# Patient Record
Sex: Female | Born: 1970 | Hispanic: Yes | Smoking: Never smoker
Health system: Southern US, Community
[De-identification: ages and names within clinical notes are randomized; demographics above are authoritative.]

## PROBLEM LIST (undated history)

## (undated) DIAGNOSIS — L509 Urticaria, unspecified: Secondary | ICD-10-CM

## (undated) DIAGNOSIS — R51 Headache: Secondary | ICD-10-CM

## (undated) DIAGNOSIS — R519 Headache, unspecified: Secondary | ICD-10-CM

## (undated) DIAGNOSIS — I10 Essential (primary) hypertension: Secondary | ICD-10-CM

## (undated) DIAGNOSIS — E039 Hypothyroidism, unspecified: Secondary | ICD-10-CM

## (undated) DIAGNOSIS — R200 Anesthesia of skin: Secondary | ICD-10-CM

## (undated) DIAGNOSIS — M5430 Sciatica, unspecified side: Secondary | ICD-10-CM

## (undated) DIAGNOSIS — F32A Depression, unspecified: Secondary | ICD-10-CM

## (undated) DIAGNOSIS — K760 Fatty (change of) liver, not elsewhere classified: Secondary | ICD-10-CM

## (undated) DIAGNOSIS — J45909 Unspecified asthma, uncomplicated: Secondary | ICD-10-CM

## (undated) DIAGNOSIS — M1990 Unspecified osteoarthritis, unspecified site: Secondary | ICD-10-CM

## (undated) DIAGNOSIS — Z8639 Personal history of other endocrine, nutritional and metabolic disease: Secondary | ICD-10-CM

## (undated) DIAGNOSIS — E079 Disorder of thyroid, unspecified: Secondary | ICD-10-CM

## (undated) DIAGNOSIS — K8020 Calculus of gallbladder without cholecystitis without obstruction: Secondary | ICD-10-CM

## (undated) DIAGNOSIS — F329 Major depressive disorder, single episode, unspecified: Secondary | ICD-10-CM

## (undated) DIAGNOSIS — K219 Gastro-esophageal reflux disease without esophagitis: Secondary | ICD-10-CM

## (undated) HISTORY — PX: APPENDECTOMY: SHX54

## (undated) HISTORY — DX: Gastro-esophageal reflux disease without esophagitis: K21.9

## (undated) HISTORY — PX: COLONOSCOPY: SHX174

## (undated) HISTORY — PX: ABDOMINAL HYSTERECTOMY: SHX81

## (undated) HISTORY — PX: UPPER GI ENDOSCOPY: SHX6162

## (undated) HISTORY — DX: Urticaria, unspecified: L50.9

---

## 2008-02-13 ENCOUNTER — Ambulatory Visit: Payer: Self-pay | Admitting: *Deleted

## 2008-02-13 ENCOUNTER — Encounter (INDEPENDENT_AMBULATORY_CARE_PROVIDER_SITE_OTHER): Payer: Self-pay | Admitting: Family Medicine

## 2008-02-13 ENCOUNTER — Ambulatory Visit: Payer: Self-pay | Admitting: Internal Medicine

## 2008-02-13 LAB — CONVERTED CEMR LAB
ALT: 29 units/L (ref 0–35)
AST: 21 units/L (ref 0–37)
Alkaline Phosphatase: 87 units/L (ref 39–117)
Basophils Absolute: 0 10*3/uL (ref 0.0–0.1)
Basophils Relative: 0 % (ref 0–1)
Creatinine, Ser: 0.6 mg/dL (ref 0.40–1.20)
Eosinophils Relative: 2 % (ref 0–5)
HCT: 39.4 % (ref 36.0–46.0)
Hemoglobin: 12.7 g/dL (ref 12.0–15.0)
MCHC: 32.2 g/dL (ref 30.0–36.0)
Monocytes Absolute: 0.7 10*3/uL (ref 0.1–1.0)
Platelets: 313 10*3/uL (ref 150–400)
RDW: 13.9 % (ref 11.5–15.5)
Sodium: 139 meq/L (ref 135–145)
TSH: 8.121 microintl units/mL — ABNORMAL HIGH (ref 0.350–5.50)
Total Bilirubin: 0.3 mg/dL (ref 0.3–1.2)
Total Protein: 7 g/dL (ref 6.0–8.3)

## 2008-03-17 ENCOUNTER — Ambulatory Visit: Payer: Self-pay | Admitting: Family Medicine

## 2008-03-17 ENCOUNTER — Encounter (INDEPENDENT_AMBULATORY_CARE_PROVIDER_SITE_OTHER): Payer: Self-pay | Admitting: Family Medicine

## 2008-04-22 ENCOUNTER — Encounter (INDEPENDENT_AMBULATORY_CARE_PROVIDER_SITE_OTHER): Payer: Self-pay | Admitting: Family Medicine

## 2008-04-22 ENCOUNTER — Ambulatory Visit: Payer: Self-pay | Admitting: Internal Medicine

## 2008-09-01 ENCOUNTER — Ambulatory Visit: Payer: Self-pay | Admitting: Internal Medicine

## 2008-10-31 ENCOUNTER — Ambulatory Visit: Payer: Self-pay | Admitting: Internal Medicine

## 2008-10-31 ENCOUNTER — Encounter: Payer: Self-pay | Admitting: Family Medicine

## 2008-10-31 LAB — CONVERTED CEMR LAB
AST: 16 units/L (ref 0–37)
Albumin: 4.1 g/dL (ref 3.5–5.2)
Alkaline Phosphatase: 68 units/L (ref 39–117)
Band Neutrophils: 0 % (ref 0–10)
Chloride: 106 meq/L (ref 96–112)
Eosinophils Relative: 1 % (ref 0–5)
Glucose, Bld: 108 mg/dL — ABNORMAL HIGH (ref 70–99)
HCT: 35.4 % — ABNORMAL LOW (ref 36.0–46.0)
Hemoglobin: 12.5 g/dL (ref 12.0–15.0)
Lymphocytes Relative: 36 % (ref 12–46)
Lymphs Abs: 2.9 10*3/uL (ref 0.7–4.0)
MCV: 85.9 fL (ref 78.0–100.0)
Monocytes Relative: 10 % (ref 3–12)
Potassium: 3.8 meq/L (ref 3.5–5.3)
RBC: 4.12 M/uL (ref 3.87–5.11)
Sodium: 141 meq/L (ref 135–145)
Total Protein: 7.3 g/dL (ref 6.0–8.3)
WBC: 8.1 10*3/uL (ref 4.0–10.5)

## 2008-11-28 ENCOUNTER — Encounter: Payer: Self-pay | Admitting: Family Medicine

## 2008-11-28 ENCOUNTER — Ambulatory Visit: Payer: Self-pay | Admitting: Internal Medicine

## 2008-11-28 LAB — CONVERTED CEMR LAB: Vit D, 25-Hydroxy: 22 ng/mL — ABNORMAL LOW (ref 30–89)

## 2008-12-02 ENCOUNTER — Encounter: Payer: Self-pay | Admitting: Family Medicine

## 2008-12-22 ENCOUNTER — Ambulatory Visit: Payer: Self-pay | Admitting: Internal Medicine

## 2009-03-27 ENCOUNTER — Ambulatory Visit: Payer: Self-pay | Admitting: Internal Medicine

## 2009-03-27 ENCOUNTER — Encounter: Payer: Self-pay | Admitting: Family Medicine

## 2009-03-27 LAB — CONVERTED CEMR LAB
BUN: 14 mg/dL (ref 6–23)
CO2: 19 meq/L (ref 19–32)
Calcium: 9.3 mg/dL (ref 8.4–10.5)
Chloride: 106 meq/L (ref 96–112)
Creatinine, Ser: 0.87 mg/dL (ref 0.40–1.20)
TSH: 1.695 microintl units/mL (ref 0.350–4.500)

## 2009-05-01 ENCOUNTER — Ambulatory Visit: Payer: Self-pay | Admitting: Internal Medicine

## 2009-07-06 ENCOUNTER — Ambulatory Visit: Payer: Self-pay | Admitting: Internal Medicine

## 2009-07-13 ENCOUNTER — Encounter: Payer: Self-pay | Admitting: Family Medicine

## 2009-07-13 ENCOUNTER — Ambulatory Visit: Payer: Self-pay | Admitting: Internal Medicine

## 2009-07-13 LAB — CONVERTED CEMR LAB
ALT: 16 units/L (ref 0–35)
AST: 20 units/L (ref 0–37)
Albumin: 4.3 g/dL (ref 3.5–5.2)
Calcium: 9 mg/dL (ref 8.4–10.5)
Chloride: 102 meq/L (ref 96–112)
Potassium: 3.8 meq/L (ref 3.5–5.3)
Total Protein: 7.2 g/dL (ref 6.0–8.3)

## 2009-07-15 ENCOUNTER — Ambulatory Visit: Payer: Self-pay | Admitting: Internal Medicine

## 2009-08-12 ENCOUNTER — Ambulatory Visit: Payer: Self-pay | Admitting: Internal Medicine

## 2010-06-04 ENCOUNTER — Encounter (INDEPENDENT_AMBULATORY_CARE_PROVIDER_SITE_OTHER): Payer: Self-pay | Admitting: *Deleted

## 2010-06-04 LAB — CONVERTED CEMR LAB
ALT: 15 units/L (ref 0–35)
AST: 17 units/L (ref 0–37)
Basophils Absolute: 0 10*3/uL (ref 0.0–0.1)
Basophils Relative: 0 % (ref 0–1)
Calcium: 8.9 mg/dL (ref 8.4–10.5)
Chloride: 103 meq/L (ref 96–112)
Creatinine, Ser: 0.66 mg/dL (ref 0.40–1.20)
Hemoglobin: 12.5 g/dL (ref 12.0–15.0)
MCHC: 33.4 g/dL (ref 30.0–36.0)
Monocytes Absolute: 0.6 10*3/uL (ref 0.1–1.0)
Neutro Abs: 3.5 10*3/uL (ref 1.7–7.7)
Neutrophils Relative %: 51 % (ref 43–77)
RDW: 13.5 % (ref 11.5–15.5)

## 2010-06-28 ENCOUNTER — Encounter (INDEPENDENT_AMBULATORY_CARE_PROVIDER_SITE_OTHER): Payer: Self-pay | Admitting: *Deleted

## 2010-06-28 LAB — CONVERTED CEMR LAB: GC Probe Amp, Genital: NEGATIVE

## 2011-03-17 ENCOUNTER — Other Ambulatory Visit: Payer: Self-pay

## 2011-03-17 ENCOUNTER — Ambulatory Visit (HOSPITAL_COMMUNITY)
Admission: RE | Admit: 2011-03-17 | Discharge: 2011-03-17 | Disposition: A | Discharge status: Home / Self Care | Payer: Self-pay | Source: Ambulatory Visit

## 2011-03-17 DIAGNOSIS — M542 Cervicalgia: Secondary | ICD-10-CM

## 2011-03-17 DIAGNOSIS — M549 Dorsalgia, unspecified: Secondary | ICD-10-CM | POA: Insufficient documentation

## 2011-03-17 DIAGNOSIS — M25519 Pain in unspecified shoulder: Secondary | ICD-10-CM | POA: Insufficient documentation

## 2011-03-17 DIAGNOSIS — M519 Unspecified thoracic, thoracolumbar and lumbosacral intervertebral disc disorder: Secondary | ICD-10-CM | POA: Insufficient documentation

## 2011-09-27 ENCOUNTER — Other Ambulatory Visit: Payer: Self-pay | Admitting: Family Medicine

## 2011-10-21 ENCOUNTER — Ambulatory Visit: Payer: Self-pay | Attending: Family Medicine | Admitting: Physical Therapy

## 2011-10-21 DIAGNOSIS — IMO0001 Reserved for inherently not codable concepts without codable children: Secondary | ICD-10-CM | POA: Insufficient documentation

## 2011-10-21 DIAGNOSIS — M542 Cervicalgia: Secondary | ICD-10-CM | POA: Insufficient documentation

## 2011-10-21 DIAGNOSIS — M25519 Pain in unspecified shoulder: Secondary | ICD-10-CM | POA: Insufficient documentation

## 2011-10-26 ENCOUNTER — Ambulatory Visit: Payer: Self-pay | Admitting: Rehabilitation

## 2011-10-28 ENCOUNTER — Ambulatory Visit: Payer: Self-pay | Admitting: Physical Therapy

## 2011-11-01 ENCOUNTER — Ambulatory Visit: Payer: Self-pay | Admitting: Rehabilitation

## 2011-11-03 ENCOUNTER — Ambulatory Visit: Payer: Self-pay | Admitting: Rehabilitation

## 2011-11-08 ENCOUNTER — Ambulatory Visit: Payer: Self-pay | Admitting: Physical Therapy

## 2011-11-10 ENCOUNTER — Ambulatory Visit: Payer: Self-pay | Admitting: Rehabilitation

## 2011-11-15 ENCOUNTER — Ambulatory Visit: Payer: Self-pay | Admitting: Physical Therapy

## 2011-11-17 ENCOUNTER — Encounter: Payer: Self-pay | Admitting: Physical Therapy

## 2012-12-23 ENCOUNTER — Encounter (HOSPITAL_COMMUNITY): Payer: Self-pay | Admitting: Family Medicine

## 2012-12-23 ENCOUNTER — Emergency Department (HOSPITAL_COMMUNITY): Payer: Self-pay

## 2012-12-23 ENCOUNTER — Emergency Department (HOSPITAL_COMMUNITY)
Admission: EM | Admit: 2012-12-23 | Discharge: 2012-12-23 | Disposition: A | Discharge status: Home / Self Care | Payer: Self-pay | Attending: Emergency Medicine | Admitting: Emergency Medicine

## 2012-12-23 DIAGNOSIS — R51 Headache: Secondary | ICD-10-CM | POA: Insufficient documentation

## 2012-12-23 DIAGNOSIS — R0602 Shortness of breath: Secondary | ICD-10-CM | POA: Insufficient documentation

## 2012-12-23 DIAGNOSIS — Z862 Personal history of diseases of the blood and blood-forming organs and certain disorders involving the immune mechanism: Secondary | ICD-10-CM | POA: Insufficient documentation

## 2012-12-23 DIAGNOSIS — R079 Chest pain, unspecified: Secondary | ICD-10-CM | POA: Insufficient documentation

## 2012-12-23 DIAGNOSIS — R110 Nausea: Secondary | ICD-10-CM | POA: Insufficient documentation

## 2012-12-23 DIAGNOSIS — Z8639 Personal history of other endocrine, nutritional and metabolic disease: Secondary | ICD-10-CM | POA: Insufficient documentation

## 2012-12-23 HISTORY — DX: Disorder of thyroid, unspecified: E07.9

## 2012-12-23 LAB — POCT I-STAT, CHEM 8
BUN: 20 mg/dL (ref 6–23)
Calcium, Ion: 1.26 mmol/L — ABNORMAL HIGH (ref 1.12–1.23)
Creatinine, Ser: 0.8 mg/dL (ref 0.50–1.10)
Glucose, Bld: 87 mg/dL (ref 70–99)
TCO2: 25 mmol/L (ref 0–100)

## 2012-12-23 LAB — URINALYSIS, ROUTINE W REFLEX MICROSCOPIC
Leukocytes, UA: NEGATIVE
Protein, ur: NEGATIVE mg/dL
Urobilinogen, UA: 0.2 mg/dL (ref 0.0–1.0)

## 2012-12-23 LAB — CBC WITH DIFFERENTIAL/PLATELET
Basophils Relative: 1 % (ref 0–1)
Eosinophils Absolute: 0.2 10*3/uL (ref 0.0–0.7)
Lymphs Abs: 4.8 10*3/uL — ABNORMAL HIGH (ref 0.7–4.0)
MCH: 30.3 pg (ref 26.0–34.0)
MCHC: 36.3 g/dL — ABNORMAL HIGH (ref 30.0–36.0)
Neutro Abs: 3.5 10*3/uL (ref 1.7–7.7)
Neutrophils Relative %: 37 % — ABNORMAL LOW (ref 43–77)
Platelets: 280 10*3/uL (ref 150–400)
RBC: 4.55 MIL/uL (ref 3.87–5.11)

## 2012-12-23 LAB — PREGNANCY, URINE: Preg Test, Ur: NEGATIVE

## 2012-12-23 MED ORDER — SUCRALFATE 1 GM/10ML PO SUSP: 1.0000 g | Freq: Four times a day (QID) | ORAL | Status: DC

## 2012-12-23 MED ORDER — OMEPRAZOLE 20 MG PO CPDR: 20.0000 mg | DELAYED_RELEASE_CAPSULE | Freq: Every day | ORAL | Status: DC

## 2012-12-23 MED ORDER — GI COCKTAIL ~~LOC~~
30.0000 mL | Freq: Once | ORAL | Status: AC
  Administered 2012-12-23: 30 mL via ORAL
  Filled 2012-12-23: qty 30

## 2012-12-23 NOTE — ED Notes (Signed)
NAD noted at time of d/c home 

## 2012-12-23 NOTE — ED Provider Notes (Signed)
History     CSN: [id number]  Arrival date & time 12/23/12  1651   First MD Initiated Contact with Patient 12/23/12 1659      Chief Complaint  Patient presents with  . Shortness of Breath  . Chest Pain    (Consider location/radiation/quality/duration/timing/severity/associated sxs/prior treatment) HPI Comments: Patient presents with a two month history of chest pain or shortness of breath. She describes a tightness in the Center of her chest and she points up and down to her chest and throat area. She says it feels tight. It happens daily. It's nonexertional. She has associated shortness of breath with it. She has no nausea other than when she has a headache. She has chronic daily headaches which are unchanged from her baseline. She denies any diaphoresis. She previously had a productive cough but currently denies that. Denies a fevers or chills. Denies any pleuritic-type chest pain. Denies any leg pain or swelling. Denies any family history of early heart disease. She's a nonsmoker and has no other past medical history other than thyroid disease. She's been told her blood pressures been mildly elevated in the past. She was seen by her primary care provider and started on albuterol inhaler which has not improved her symptoms. She does feel wheezy at times.  Patient is a 42 y.o. female presenting with shortness of breath and chest pain.  Shortness of Breath Associated symptoms: chest pain and headaches   Associated symptoms: no abdominal pain, no cough, no diaphoresis, no fever, no rash and no vomiting   Chest Pain Associated symptoms: headache, nausea and shortness of breath   Associated symptoms: no abdominal pain, no back pain, no cough, no diaphoresis, no dizziness, no fatigue, no fever, no numbness, not vomiting and no weakness     Past Medical History  Diagnosis Date  . Thyroid disease     History reviewed. No pertinent past surgical history.  History reviewed. No pertinent  family history.  History  Substance Use Topics  . Smoking status: Never Smoker   . Smokeless tobacco: Not on file  . Alcohol Use: No    OB History   Grav Para Term Preterm Abortions TAB SAB Ect Mult Living                  Review of Systems  Constitutional: Negative for fever, chills, diaphoresis and fatigue.  HENT: Negative for congestion, rhinorrhea and sneezing.   Eyes: Negative.   Respiratory: Positive for shortness of breath. Negative for cough and chest tightness.   Cardiovascular: Positive for chest pain. Negative for leg swelling.  Gastrointestinal: Positive for nausea. Negative for vomiting, abdominal pain, diarrhea and blood in stool.  Genitourinary: Negative for frequency, hematuria, flank pain and difficulty urinating.  Musculoskeletal: Negative for back pain and arthralgias.  Skin: Negative for rash.  Neurological: Positive for headaches. Negative for dizziness, speech difficulty, weakness and numbness.    Allergies  Review of patient's allergies indicates no known allergies.  Home Medications   Current Outpatient Rx  Name  Route  Sig  Dispense  Refill  . omeprazole (PRILOSEC) 20 MG capsule   Oral   Take 1 capsule (20 mg total) by mouth daily.   30 capsule   0     BP 140/87  Temp(Src) 99.2 F (37.3 C) (Oral)  Resp 11  SpO2 100%  LMP 12/03/2012  Physical Exam  Constitutional: She is oriented to person, place, and time. She appears well-developed and well-nourished.  HENT:  Head: Normocephalic and atraumatic.  Eyes: Pupils are equal, round, and reactive to light.  Neck: Normal range of motion. Neck supple.  Cardiovascular: Normal rate, regular rhythm and normal heart sounds.   Pulmonary/Chest: Effort normal and breath sounds normal. No respiratory distress. She has no wheezes. She has no rales. She exhibits no tenderness.  Abdominal: Soft. Bowel sounds are normal. There is no tenderness. There is no rebound and no guarding.  Musculoskeletal:  Normal range of motion. She exhibits no edema.  Negative Homans sign  Lymphadenopathy:    She has no cervical adenopathy.  Neurological: She is alert and oriented to person, place, and time.  Skin: Skin is warm and dry. No rash noted.  Psychiatric: She has a normal mood and affect.    ED Course  Procedures (including critical care time)   Date: 12/23/2012  Rate: 69  Rhythm: normal sinus rhythm  QRS Axis: normal  Intervals: normal  ST/T Wave abnormalities: nonspecific ST/T changes  Conduction Disutrbances:none  Narrative Interpretation:   Old EKG Reviewed: none available  Results for orders placed during the hospital encounter of 12/23/12  CBC WITH DIFFERENTIAL      Result Value Range   WBC 9.7  4.0 - 10.5 K/uL   RBC 4.55  3.87 - 5.11 MIL/uL   Hemoglobin 13.8  12.0 - 15.0 g/dL   HCT 78.2  95.6 - 21.3 %   MCV 83.5  78.0 - 100.0 fL   MCH 30.3  26.0 - 34.0 pg   MCHC 36.3 (*) 30.0 - 36.0 g/dL   RDW 08.6  57.8 - 46.9 %   Platelets 280  150 - 400 K/uL   Neutrophils Relative 37 (*) 43 - 77 %   Neutro Abs 3.5  1.7 - 7.7 K/uL   Lymphocytes Relative 50 (*) 12 - 46 %   Lymphs Abs 4.8 (*) 0.7 - 4.0 K/uL   Monocytes Relative 10  3 - 12 %   Monocytes Absolute 1.0  0.1 - 1.0 K/uL   Eosinophils Relative 2  0 - 5 %   Eosinophils Absolute 0.2  0.0 - 0.7 K/uL   Basophils Relative 1  0 - 1 %   Basophils Absolute 0.1  0.0 - 0.1 K/uL  URINALYSIS, ROUTINE W REFLEX MICROSCOPIC      Result Value Range   Color, Urine YELLOW  YELLOW   APPearance CLOUDY (*) CLEAR   Specific Gravity, Urine 1.019  1.005 - 1.030   pH 6.5  5.0 - 8.0   Glucose, UA NEGATIVE  NEGATIVE mg/dL   Hgb urine dipstick NEGATIVE  NEGATIVE   Bilirubin Urine NEGATIVE  NEGATIVE   Ketones, ur NEGATIVE  NEGATIVE mg/dL   Protein, ur NEGATIVE  NEGATIVE mg/dL   Urobilinogen, UA 0.2  0.0 - 1.0 mg/dL   Nitrite NEGATIVE  NEGATIVE   Leukocytes, UA NEGATIVE  NEGATIVE  PREGNANCY, URINE      Result Value Range   Preg Test, Ur  NEGATIVE  NEGATIVE  D-DIMER, QUANTITATIVE      Result Value Range   D-Dimer, Quant <0.27  0.00 - 0.48 ug/mL-FEU  POCT I-STAT TROPONIN I      Result Value Range   Troponin i, poc 0.00  0.00 - 0.08 ng/mL   Comment 3           POCT I-STAT, CHEM 8      Result Value Range   Sodium 140  135 - 145 mEq/L   Potassium 3.1 (*) 3.5 - 5.1 mEq/L   Chloride 105  96 - 112 mEq/L   BUN 20  6 - 23 mg/dL   Creatinine, Ser 1.61  0.50 - 1.10 mg/dL   Glucose, Bld 87  70 - 99 mg/dL   Calcium, Ion 0.96 (*) 1.12 - 1.23 mmol/L   TCO2 25  0 - 100 mmol/L   Hemoglobin 13.9  12.0 - 15.0 g/dL   HCT 04.5  40.9 - 81.1 %   Dg Chest 2 View  12/23/2012  *RADIOLOGY REPORT*  Clinical Data: Chest pain.  Nonsmoker.  CHEST - 2 VIEW  Comparison: None.  Findings: Suboptimal inspiration.  Heart size and mediastinal contours are normal.  The lungs are clear aside from possible mild central airway thickening.  There is no pleural effusion or pneumothorax.  Telemetry leads overlie the chest.  IMPRESSION: Possible mild central airway thickening.  No airspace disease or pleural effusion demonstrated.   Original Report Authenticated By: Carey Bullocks, M.D.        1. Chest pain       MDM  Patient is a 2 month history of daily discomfort in the Center of her chest. She points up into her throat area. She has associated shortness of breath currently exam is clear. She's been using albuterol inhaler without relief. Her chest x-ray is clear. She has no symptoms suggestive of pulmonary embolus. I do not feel her symptoms are consistent with acute coronary syndrome. She has no other associated symptoms. I will go ahead and treat her for possible reflux and I stressed the importance of close followup with her primary care physician. Advise return here as needed for any worsening symptoms.        Rolan Bucco, MD 12/23/12 [id number]

## 2012-12-23 NOTE — ED Notes (Addendum)
Per pt translator she has been having SOB, and tightness in chest. sts was given inhaler by her doctor but it isn't working. sts has been intermittent x 2 months. sts also she has been having some dizziness and HA. Pt tearful

## 2016-06-16 ENCOUNTER — Encounter: Payer: Self-pay | Admitting: Gynecology

## 2016-06-16 ENCOUNTER — Ambulatory Visit (INDEPENDENT_AMBULATORY_CARE_PROVIDER_SITE_OTHER): Payer: 59 | Admitting: Gynecology

## 2016-06-16 VITALS — BP 142/88 | Ht 62.0 in | Wt 161.0 lb

## 2016-06-16 DIAGNOSIS — N940 Mittelschmerz: Secondary | ICD-10-CM

## 2016-06-16 DIAGNOSIS — N898 Other specified noninflammatory disorders of vagina: Secondary | ICD-10-CM

## 2016-06-16 DIAGNOSIS — N941 Unspecified dyspareunia: Secondary | ICD-10-CM | POA: Diagnosis not present

## 2016-06-16 LAB — WET PREP FOR TRICH, YEAST, CLUE
CLUE CELLS WET PREP: NONE SEEN
Trich, Wet Prep: NONE SEEN
WBC WET PREP: NONE SEEN
Yeast Wet Prep HPF POC: NONE SEEN

## 2016-06-16 NOTE — Progress Notes (Signed)
   HPI: Patient is a 45 year old gravida 4 para 4 that was referred to our practice by her PCP as a result of patient's dyspareunia and occasional low abdominal discomfort in between menstrual cycles and also wanted to discuss the possibility of tubal reversal since she remarried. Her 4 previous children was by another partner and subsequently she had a tubal ligation. She reports normal menstrual cycles otherwise. She states for about 4 years she's been having this clear discharge midcycle. She is in monogamous relationship. She denies any fever, chills, nausea, vomiting or any dysuria frequency.    ROS: A ROS was performed and pertinent positives and negatives are included in the history.  GENERAL: No fevers or chills. HEENT: No change in vision, no earache, sore throat or sinus congestion. NECK: No pain or stiffness. CARDIOVASCULAR: No chest pain or pressure. No palpitations. PULMONARY: No shortness of breath, cough or wheeze. GASTROINTESTINAL: No abdominal pain, nausea, vomiting or diarrhea, melena or bright red blood per rectum. GENITOURINARY: No urinary frequency, urgency, hesitancy or dysuria. MUSCULOSKELETAL: No joint or muscle pain, no back pain, no recent trauma. DERMATOLOGIC: No rash, no itching, no lesions. ENDOCRINE: No polyuria, polydipsia, no heat or cold intolerance. No recent change in weight. HEMATOLOGICAL: No anemia or easy bruising or bleeding. NEUROLOGIC: No headache, seizures, numbness, tingling or weakness. PSYCHIATRIC: No depression, no loss of interest in normal activity or change in sleep pattern.   PE: Blood pressure 142/88 Gen. appearance well-developed well-nourished overweight Hispanic female with a weight 161 pounds BMI 29.45 kg/m Back: No CVA tenderness Abdomen: Soft nontender no rebound or guarding Pelvic: Bartholin urethra Skene was within normal limits Vagina: No lesions or discharge, menstrual blood present today's date 10 of her cycle Cervix: No gross lesions  noted Bimanual exam: Uterus anteverted normal size shape and consistency Adnexa: No palpable masses or tenderness Rectal exam not done  Wet prep essentially negative  GC and Chlamydia culture obtained   Assessment Plan: Problem #1: For her dyspareunia since she sometimes suffers and vaginal dryness can use over-the-counter [place] problem #2 light-colored discharge she notices midcycle is attributed to her ovulation this is normal physiological discharge. A GC and Chlamydia culture was obtained results pending at time of this dictation. #3 patient requesting tubal reversal we discussed the risks benefits and pros and cons of tubal reversal versus in vitro fertilization such as to cost and success rate. I refer her to reproductive endocrinologist Dr. Eliott Nine for discussion. On in vitro fertilization. Literature information was provided in [place].    Greater than 50% of time was spent in counseling and coordinating care of this patient.   Time of consultation: 20   Minutes.

## 2016-06-16 NOTE — Patient Instructions (Signed)
Fertilizacin in vitro  (In Vitro Fertilization) La fertilizacin in vitro (FIV) es un tipo de Chartered certified accountant aplicada a la reproduccin asistida. La FIV consiste en una serie de procedimientos para tratar la infertilidad o problemas genticos con el objeto de prestar asistencia para la concepcin de un beb. Durante la FIV, los vulos se recuperan de los ovarios y se combinan con los espermatozoides en el laboratorio para fertilizarlos. Uno o ms vulos fertilizados (embriones) se insertan en el tero a travs del cuello uterino. Las candidatas para la FIV son:  Personas que sufren infertilidad.  Las mujeres que sufren una menopausia prematura o una falla ovrica.  Las mujeres a las que se les han extirpado ambos ovarios. En este caso, deber usarse una donante de vulos.  Mujeres que tienen daadas u obstruidas las trompas de [place] No hay lmite de edad para la FIV, pero no se recomienda para las mujeres postmenopusicas. La edad ideal para este procedimiento es de 80 aos o menos. A las mujeres de ms de 41 aos generalmente se les aconseja utilizar una donante de vulos durante la FIV para aumentar las probabilidades de [place]. INFORME A SU MDICO:   Cualquier alergia que tenga.  Todos los [employer] [place], incluyendo vitaminas, hierbas, gotas oftlmicas, cremas y medicamentos de venta libre.  Problemas previos que usted o los [employer] de su familia hayan tenido con el uso de anestsicos.  Todo problema mdico o gentico que usted o los miembros de su familia hayan sufrido.  Enfermedades de [employer].  Cirugas previas.  Embarazos previos.  Padecimientos mdicos.  Excesos en el consumo de alcohol o de tabaco.  Historia de consumo de drogas. RIESGOS Y COMPLICACIONES  Generalmente, el procedimiento de FIV es un procedimiento seguro. Sin embargo, Games developer procedimiento, pueden surgir complicaciones. Las complicaciones posibles son:  Almon [place] o  infeccin.  Problemas con la anestesia.  Cogulos sanguneos.  El procedimiento no tiene xito.  Tener gemelos o embarazos mltiples  Aumento del riesgo de [place]. ANTES DEL PROCEDIMIENTO  Antes de comenzar el ciclo de FIV, usted y el donante sern sometidos a estudios para asegurarse de que la FIV es la mejor opcin. Algunas personas pueden no beneficiarse con este tipo de tecnologa para la asistencia reproductiva.   Usted y Transport planner tendrn que proporcionar una historia clnica completa y la historia clnica de sus familiares.  Usted y Transport planner sern sometidos a un examen fsico.  Usted y el donante podrn necesitar realizarse anlisis de sangre para detectar enfermedades infecciosas, incluyendo el VIH.  Podrn solicitarle otros estudios, por ejemplo:  Estudio de los ovarios para determinar la calidad y la cantidad de vulos.  Estudios hormonales y de ovulacin.  Un examen del tero. Este se realiza [employer] de un tipo de Magazine features editor, despus de Tour manager un lquido en el tero a travs del cuello uterino (ecohisterografa o utilizando un tubo delgado y flexible, con Ardelia Mems pequea luz y cmara en un extremo (histeroscopio).  La esperma del donante ser tomada y [place] para ver si es normal, hay cantidad suficiente para fertilizar el vulo y que actan normalmente despus de las relaciones sexuales (examen postcoital ). PROCEDIMIENTO  La FIV consiste en varios pasos. Estos procedimientos pueden Associate Professor mdico o en una clnica. Un ciclo de FIV puede llevar alrededor de 2 semanas, y podr requerirse ms de un ciclo. Los pasos de la FIV son:  IT consultant. Si se utilizan sus vulos durante la FIV, al comienzo del ciclo  comenzar un tratamiento con hormonas artificiales (sintticas. Estas hormonas estimulan los ovarios para producir mltiples vulos, a diferencia del vulo nico que normalmente se desarrolla cada mes. Es necesario obtener  mltiples vulos debido a que algunos de ellos no se fertilizarn o no se desarrollarn normalmente despus de Ambulance person.  Extraccin de vulos. Utilizando imgenes radiogrficas como gua, el medico insertar una aguja fina a travs de la vagina y IT trainer a los ovarios y Marine scientist (folculos) que contienen los vulos La aguja se Geophysical data processor a un dispositivo de succin, que retira los vulos y el lquido de cada folculo, uno por vez. El procedimiento se repite para el otro ovario.  Inseminacin y fertilizacin. El esperma se une a los ovarios (inseminacin) y se Radiographer, therapeutic en una cmara que tiene un ambiente controlado. Generalmente el esperma ingresa (fertiliza) el vulo unas horas despus de la inseminacin.  Transferencia embrionaria. El Investment banker, operational los embriones en su tero usando un tubo delgado (catter) que contiene los embriones. El catter se inserta en la vagina a travs del cuello uterino, y de all al tero. La transferencia de vulos generalmente ocurre entre 2 a 6 das luego de la extraccin de los vulos. Si hay xito, el embrin se adherir (implante) en la superficie interna del tero alrededor de 6 a 10 das luego de la extraccin de los vulos. Si se implanta el embrin en la membrana que cubre el tero y se desarrolla, resulta en un embarazo. DESPUS DEL PROCEDIMIENTO   Tendr que permanecer acostada durante una hora o ms, antes de volver a su casa.  Ser necesario que siga con la terapia hormonal por 3 meses aproximadamente, o segn lo que le indique su mdico.  Puede retomar su dieta y las actividades habituales.   Esta informacin no tiene Marine scientist el consejo del mdico. Asegrese de hacerle al mdico cualquier pregunta que tenga.   Document Released: 04/10/2013 Elsevier Interactive Patient Education [employer].

## 2016-06-17 LAB — GC/CHLAMYDIA PROBE AMP
CT PROBE, AMP APTIMA: NOT DETECTED
GC PROBE AMP APTIMA: NOT DETECTED

## 2016-07-05 ENCOUNTER — Ambulatory Visit (INDEPENDENT_AMBULATORY_CARE_PROVIDER_SITE_OTHER): Payer: 59 | Admitting: Gynecology

## 2016-07-05 ENCOUNTER — Encounter: Payer: Self-pay | Admitting: Gynecology

## 2016-07-05 ENCOUNTER — Telehealth: Payer: Self-pay

## 2016-07-05 VITALS — BP 128/76

## 2016-07-05 DIAGNOSIS — N898 Other specified noninflammatory disorders of vagina: Secondary | ICD-10-CM | POA: Diagnosis not present

## 2016-07-05 DIAGNOSIS — N941 Unspecified dyspareunia: Secondary | ICD-10-CM

## 2016-07-05 DIAGNOSIS — R140 Abdominal distension (gaseous): Secondary | ICD-10-CM

## 2016-07-05 DIAGNOSIS — R300 Dysuria: Secondary | ICD-10-CM | POA: Diagnosis not present

## 2016-07-05 LAB — URINALYSIS W MICROSCOPIC + REFLEX CULTURE
BACTERIA UA: NONE SEEN [HPF]
BILIRUBIN URINE: NEGATIVE
CRYSTALS: NONE SEEN [HPF]
Casts: NONE SEEN [LPF]
GLUCOSE, UA: NEGATIVE
HGB URINE DIPSTICK: NEGATIVE
Ketones, ur: NEGATIVE
Leukocytes, UA: NEGATIVE
Nitrite: NEGATIVE
PH: 6 (ref 5.0–8.0)
PROTEIN: NEGATIVE
RBC / HPF: NONE SEEN RBC/HPF (ref <=2)
Specific Gravity, Urine: <1.005 (ref 1.001–1.035)
Yeast: NONE SEEN [HPF]

## 2016-07-05 LAB — WET PREP FOR TRICH, YEAST, CLUE
CLUE CELLS WET PREP: NONE SEEN
TRICH WET PREP: NONE SEEN
Yeast Wet Prep HPF POC: NONE SEEN

## 2016-07-05 NOTE — Telephone Encounter (Signed)
Patient walked in.  Patient was seen on 06/16/16 with neg GC /chl however yet now with UTI symptoms to include painful urination, strong odor and urine very bright colored even when diluted.    Ok to order urinalysis since patient is here now?

## 2016-07-05 NOTE — Telephone Encounter (Signed)
I believe she is here right now we will see her

## 2016-07-05 NOTE — Patient Instructions (Signed)
Laparoscopia de diagnstico (Diagnostic Laparoscopy) La laparoscopia de diagnstico es un procedimiento que se hace para diagnosticar enfermedades en el abdomen. Durante su realizacin, se introduce en el abdomen un instrumento delgado del tamao de un lpiz que tiene [place] luz, llamado laparoscopio, a travs de una incisin. El laparoscopio le permite al mdico observar los rganos internos. INFORME A SU MDICO:  Cualquier alergia que tenga.  Todos los [employer], incluidos vitaminas, hierbas, gotas oftlmicas, cremas y medicamentos de venta libre.  Problemas previos que usted o los [employer] de su familia hayan tenido con el uso de anestsicos.  Enfermedades de la sangre que tenga.  Si tiene cirugas previas.  Enfermedades que tenga. RIESGOS Y COMPLICACIONES En general, se trata de un procedimiento seguro. Sin embargo, es posible que haya problemas, que pueden incluir lo siguiente:  Infeccin.  Hemorragia.  Lesiones en los rganos circundantes.  Reaccin alrgica a la anestesia usada durante el procedimiento. ANTES DEL PROCEDIMIENTO  No coma ni beba nada despus de la medianoche anterior al procedimiento o segn lo que le haya indicado el mdico.  Consulte a su mdico acerca de estos temas:  Cambiar o suspender los medicamentos que toma habitualmente.  Tomar medicamentos, como aspirina e ibuprofeno. Estos medicamentos pueden tener un efecto anticoagulante en la [place]. No tome estos medicamentos antes del procedimiento si el mdico le indica que no lo haga.  Haga planes para que una persona lo lleve de vuelta a su casa despus del procedimiento. PROCEDIMIENTO  Le administrarn un medicamento para ayudarlo a relajarse (sedante).  Le administrarn un medicamento que lo har dormir (anestesia general).  Se inflar el abdomen con un gas, lo que facilitar la observacin.  Le practicarn incisiones pequeas en el abdomen.  A travs de las incisiones, se  introducirn un laparoscopio y otros instrumentos pequeos en el abdomen.  Se puede tomar Truddie Coco de tejido de un rgano del abdomen para su anlisis.  Se retirarn los instrumentos del abdomen.  Se extraer el gas.  Las incisiones se cerrarn con puntos (suturas). DESPUS DEL PROCEDIMIENTO Le controlarn con frecuencia la presin arterial, la frecuencia cardaca, la frecuencia respiratoria y Retail buyer de oxgeno en la sangre hasta que haya desaparecido el efecto de los medicamentos administrados. Esta informacin no tiene Marine scientist el consejo del mdico. Asegrese de hacerle al mdico cualquier pregunta que tenga. Document Released: 08/08/2005 Document Revised: 08/29/2014 Document Reviewed: 03/21/2014 Elsevier Interactive Patient Education  2017 [employer]. Endometriosis (Endometriosis) La endometriosis es una enfermedad en la que el tejido que rodea al tero (endometrio) crece fuera de su ubicacin normal. El tejido puede crecer en muchos lugares cerca del tero, pero comnmente crece en los ovarios, las trompas de [place], la vagina o el intestino. Dado que el tero expulsa o desprende su revestimiento en cada ciclo menstrual, hay sangrado en el lugar donde se localiza el tejido endometrial. Esto puede causar dolor porque la sangre es irritante para los tejidos que no estn normalmente expuestos a Librarian, academic.  CAUSAS  Se desconoce la causa de la endometriosis.  SIGNOS Y SNTOMAS  A menudo, no hay sntomas. Cuando se presentan sntomas, estos pueden variar segn la ubicacin del tejido desplazado. Pueden ocurrir diversos sntomas en diferentes momentos. Aunque los sntomas se producen principalmente durante el perodo menstrual de [place], tambin pueden aparecer en la mitad del [place], y generalmente terminan con la menopausia. Algunas personas pueden pasar meses sin experimentar ningn tipo de sntomas. Los sntomas pueden incluir:   Dolor abdominal o  en la espalda.  Sangrado  ms abundante durante los perodos [employer].  Pana.  Dolor al defecar.  Infertilidad. DIAGNSTICO  El mdico le preguntar acerca de sus sntomas y le har un examen fsico. Se pueden realizar varios estudios, por ejemplo:   Anlisis de [place] y [place]. Estos se realizan para ayudar a Sport and exercise psychologist.  Ecografas. Este estudio se realiza para observar el tejido anormal.  Radiografa del recto (enema de bario).  Laparoscopia. En este procedimiento, se introduce en el abdomen un tubo delgado, que emite luz y tiene una pequea cmara en el extremo (laparoscopio). Esto ayuda a que su mdico observe el tejido anormal para confirmar el diagnstico. El mdico tambin puede tomar una pequea [place] de tejido anormal (biopsia) que encuentra. Posteriormente esta muestra de tejido se enva a un laboratorio para examinarlo con un microscopio. [place] y puede incluir lo siguiente:   Medicamentos para Best boy. Los antiinflamatorios no esteroides Dayna Ramus) son un tipo de analgsico que pueden ayudar a Best boy causado por la endometriosis.  Terapia hormonal. Cuando se use la terapia hormonal, se eliminan los perodos menstruales. Esto elimina la exposicin mensual a la sangre por el tejido endometrial desplazado.  Ciruga. Algunas veces puede hacerse una ciruga para extirpar el tejido endometrial anormal. Cuando los casos son graves, puede realizarse una ciruga para extirpar las trompas de [place], el tero y los ovarios (histerectoma). [place] todos los [employer] se lo haya indicado el mdico. No tome aspirina porque este medicamento puede aumentar el sangrado cuando no recibe terapia hormonal.  Evite actividades que produzcan dolor, incluida la actividad sexual. SOLICITE ATENCIN MDICA SI:  Tiene dolor plvico durante los perodos menstruales y antes y  despus de [place].  Siente dolor plvico [employer] perodos menstruales que empeora durante el perodo.  Experimenta dolor plvico durante la actividad sexual o despus de [place].  Siente dolor plvico al defecar u orinar, especialmente durante el perodo menstrual.  Tiene dificultad para quedar embarazada.  Tiene fiebre. SOLICITE ATENCIN MDICA DE INMEDIATO SI:   El dolor es intenso y no responde a los analgsicos.  Siente nuseas y vmitos intensos, o no puede [employer].  Tiene dolor que se limita a la parte inferior derecha del abdomen.  Presenta hinchazn o aumento del dolor en el abdomen.  Observa sangre en la materia fecal. ASEGRESE DE QUE:   Comprende estas instrucciones.  Controlar su afeccin.  Recibir ayuda de inmediato si no mejora o si empeora. Esta informacin no tiene Marine scientist el consejo del mdico. Asegrese de hacerle al mdico cualquier pregunta que tenga. Document Released: 08/08/2005 Document Revised: 11/30/2015 Elsevier Interactive Patient Education  2017 [employer].

## 2016-07-05 NOTE — Progress Notes (Signed)
     Patient is a 45 year old who was seen in the office for the first time on October 26 as a referral from her PCP as a result of her dyspareunia and occasional low abdominal discomfort especially in between cycles. We had made the assumption that her discomfort was attributed to mittelschmerz. But now she states that she also has dyspareunia deep thrust. She also had complaint of a vaginal discharge and had a negative GC and Chlamydia culture and wet prep. She also stated time it caused her to have frequency in urination and some dysuria but no fever, chills, nausea, vomiting.  Exam: Back: No CVA tenderness Abdomen: Soft nontender no rebound or guarding Pelvic: Bartholin urethra Skene glands within normal limits Vagina: No lesions or discharge other she was tender in the cul-de-sac when it the area was probed with a cotton swab. Cervix: No gross lesions on inspection Uterus: Anteverted though tender on palpation Adnexa limited due to patient discomfort that no large masses noted Rectal exam not done  Urinalysis negative  Wet prep negative  Assessment/plan: I provided patient with literature information on endometriosis as well as a laparoscopy. She'll return back to the office in 1-2 weeks for pelvic ultrasound. We discussed the possibility of if there is a high suspicion of endometriosis we can try to treat her without surgery and if no response then proceed with surgery. All this information was provided in [place].

## 2016-07-06 LAB — URINE CULTURE: Organism ID, Bacteria: NO GROWTH

## 2016-07-06 NOTE — Telephone Encounter (Signed)
JF saw patient on 07/05/16 see office visit.

## 2016-07-20 ENCOUNTER — Ambulatory Visit (INDEPENDENT_AMBULATORY_CARE_PROVIDER_SITE_OTHER): Payer: 59

## 2016-07-20 ENCOUNTER — Encounter: Payer: Self-pay | Admitting: Gynecology

## 2016-07-20 ENCOUNTER — Ambulatory Visit (INDEPENDENT_AMBULATORY_CARE_PROVIDER_SITE_OTHER): Payer: 59 | Admitting: Gynecology

## 2016-07-20 VITALS — BP 122/80 | Ht 62.0 in | Wt 161.0 lb

## 2016-07-20 DIAGNOSIS — N946 Dysmenorrhea, unspecified: Secondary | ICD-10-CM | POA: Diagnosis not present

## 2016-07-20 DIAGNOSIS — N941 Unspecified dyspareunia: Secondary | ICD-10-CM

## 2016-07-20 DIAGNOSIS — N898 Other specified noninflammatory disorders of vagina: Secondary | ICD-10-CM

## 2016-07-20 DIAGNOSIS — R6882 Decreased libido: Secondary | ICD-10-CM

## 2016-07-20 DIAGNOSIS — R102 Pelvic and perineal pain: Secondary | ICD-10-CM | POA: Diagnosis not present

## 2016-07-20 DIAGNOSIS — R140 Abdominal distension (gaseous): Secondary | ICD-10-CM

## 2016-07-20 MED ORDER — LEVONORGESTREL-ETHINYL ESTRAD 0.1-20 MG-MCG PO TABS: ORAL_TABLET | ORAL | 4 refills | Status: DC

## 2016-07-20 NOTE — Progress Notes (Signed)
   Patient is a 45 year old that presented to the office today with her partner to discuss her ultrasound. Patient been seen the office several times in the past complaining of dyspareunia and recently had some low abdominal discomfort especially at times between her periods. She denied any intermenstrual bleeding. She had a negative GC and Chlamydia culture recently. We had discussed in the past office visit the possibility of her having underlying endometriosis and literature information was provided. She is here to discuss the ultrasound. She was no acute distress today. She's had a previous tubal ligation had 4 children with another partner.  Ultrasound: Uterus measures 7.8 x 5.8 x 4.2 cm with endometrial stripe of 8.8 mm. Right and left ovary were normal several follicles were noted in both ovaries otherwise normal no fluid in the cul-de-sac.  Assessment/plan: Symptoms highly suspicious for possible endometriosis we discussed treatment options to include Depo-Provera injection every 3 months versus Lupron every 3 months 2 versus continuous low dose oral contraceptive pill and symptoms do not improve then proceed with laparoscopic evaluation. She has decided to proceed with a low dose 20 g oral contraceptive pill. She will be prescribed Aiane 20 g oral contraceptive pill to take continuously and withdrawal every 3 months. If this helps with her symptoms we will see her next year for annual exam or sooner if symptoms are not improved. Literature information was provided. Risk benefits and pros and cons of oral contraceptive pill to include DVT and pulmonary embolism were discussed. Many years ago she had been on the oral contraceptive pill which she was younger with no problems. She denies any family history of any clotting disorders.

## 2016-07-20 NOTE — Patient Instructions (Signed)
Informacin sobre los Electronics engineer (Oral Contraception Information) Los anticonceptivos orales (ACO) son medicamentos que se utilizan para Therapist, occupational. Su funcin es [employer] ovarios liberen vulos. Las hormonas de los ACO tambin hacen que el moco cervical se haga ms espeso, lo que evita que el esperma ingrese al tero. Tambin hacen que la membrana que recubre internamente al tero se vuelva ms fina, lo que no permite que el huevo fertilizado se adhiera a la pared del tero. Los ACO son muy efectivos cuando se toman exactamente como se prescriben. Sin embargo, no previenen contra las enfermedades de transmisin sexual (ETS). La prctica del sexo seguro, como el uso de preservativos, junto con la [place], [place] a prevenir ese tipo de enfermedades. Antes de tomar la pldora, usted debe hacerse un examen fsico y un test de Pap. El mdico podr indicarle anlisis de [place], si es necesario. El mdico se asegurar de que usted sea [place] buena candidata para usar anticonceptivos orales. Converse con su mdico acerca de los posibles efectos secundarios de los ACO que podran recetarle. Cuando se inicia el uso de ACO, puede llevar 2 a 3 meses para que su organismo se adapte a los cambios en los niveles hormonales. TIPOS DE ANTICONCEPTIVOS ORALES  Pldora combinada: esta pldora contiene las hormonas estrgeno y progestina (progesterona sinttica). La pldora combinada viene en envases para [address] [place]. Algunos tipos de pldoras combinadas deben tomarse de manera continua (pldoras para 365 das). En los envases para [address], usted no tomar las pldoras durante 7 das despus de la ltima pldora. En los envases para [address], la pldora se toma [employer]. Las ltimas 7 no contienen hormonas. Ciertos tipos de pldoras tienen ms de 21 pldoras que contienen hormonas. En los envases para [address], las primeras 84 pldoras contienen ambas hormonas y las ltimas 7 pldoras no  contienen hormonas o contienen slo Therapist, occupational.  La minipldora: esta pldora contiene la hormona progesterona solamente. Es necesario tomarla todos los das de [place] continua. Es importante que las tome a la misma hora todos [place]. Viene en envases de 28 pldoras. Las 28 pldoras contienen la hormona. Semmes los sntomas premenstruales.  Se [place] para tratar los [employer].  Regula el ciclo menstrual.  Disminuye el ciclo menstrual abundante.  Puede mejorar el acn, segn el tipo de pldora.  Trata hemorragias uterinas anormales.  Trata el sndrome ovrico poliqustico.  Trata la endometriosis.  Pueden usarse como anticonceptivo de Freight forwarder. FACTORES QUE PUEDEN HACER QUE LOS ANTICONCEPTIVOS ORALES SEAN MENOS EFECTIVOS Pueden ser menos efectivos si:  Olvid tomar la [employer] a la misma hora.  Tiene una enfermedad estomacal o intestinal que disminuye la absorcin de la pldora.  Ingiere simultneamente los anticonceptivos orales junto con otros medicamentos que los hacen menos efectivos, como antibiticos, ciertos medicamentos para el VIH y algunos medicamentos para las convulsiones.  Usted toma anticonceptivos orales que han vencido.  Cuando se [place] el envase de 21 das, se olvida de recomenzar el uso [employer] 7. RIESGOS ASOCIADOS AL USO DE ANTICONCEPTIVOS ORALES Los anticonceptivos orales pueden en algunos casos causar efectos secundarios como:  Dolor de [place].  Nuseas.  Inflamacin mamaria.  Hemorragia vaginal o manchado irregular. Las pldoras combinadas tambin se asocian a un pequeo aumento en el riesgo de:  Cogulos sanguneos.  Ataque cardaco.  Ictus. Esta informacin no tiene Marine scientist el consejo del mdico. Chief Strategy Officer  de hacerle al mdico cualquier pregunta que tenga. Document Released: 05/18/2005 Document Revised: 11/30/2015 Document Reviewed: 01/27/2013 Elsevier  Interactive Patient Education  2017 [employer].

## 2016-11-16 ENCOUNTER — Ambulatory Visit (INDEPENDENT_AMBULATORY_CARE_PROVIDER_SITE_OTHER): Payer: 59 | Admitting: Gynecology

## 2016-11-16 ENCOUNTER — Encounter: Payer: Self-pay | Admitting: Gynecology

## 2016-11-16 VITALS — BP 118/76

## 2016-11-16 DIAGNOSIS — G8929 Other chronic pain: Secondary | ICD-10-CM

## 2016-11-16 DIAGNOSIS — N941 Unspecified dyspareunia: Secondary | ICD-10-CM | POA: Diagnosis not present

## 2016-11-16 DIAGNOSIS — R102 Pelvic and perineal pain: Secondary | ICD-10-CM

## 2016-11-16 DIAGNOSIS — E039 Hypothyroidism, unspecified: Secondary | ICD-10-CM | POA: Insufficient documentation

## 2016-11-16 DIAGNOSIS — R232 Flushing: Secondary | ICD-10-CM

## 2016-11-16 DIAGNOSIS — E038 Other specified hypothyroidism: Secondary | ICD-10-CM | POA: Diagnosis not present

## 2016-11-16 NOTE — Progress Notes (Signed)
   Patient is a 46 year old gravida 4 para 4 that presented to the office today for 3 month follow-up as a result of her history of chronic pelvic pain and dyspareunia and bloating. She had been started on a continuous oral contraceptive pill for 3 months to withdrawal every 3 months to see if indirectly would help with her symptoms in the event of underlying endometriosis. She had an ultrasound in the office on 07/20/2016 which demonstrated the following:  Uterus measures 7.8 x 5.8 x 4.2 cm with endometrial stripe of 8.8 mm. Right and left ovary were normal several follicles were noted in both ovaries otherwise normal no fluid in the cul-de-sac.  As part of assessment and plan we discussed the following "Symptoms highly suspicious for possible endometriosis we discussed treatment options to include Depo-Provera injection every 3 months versus Lupron every 3 months 2 versus continuous low dose oral contraceptive pill and symptoms do not improve then proceed with laparoscopic evaluation. She has decided to proceed with a low dose 20 g oral contraceptive pill. She will be prescribed Aiane 20 g oral contraceptive pill to take continuously and withdrawal every 3 months. If this helps with her symptoms we will see her next year for annual exam or sooner if symptoms are not improved."  Patient stated she has not noticed any symptoms improvement but actually worse. Her PCP is treating her with lisinopril for her hypertension as well as being treated for hypothyroidism with Synthroid. Patient been complaining of hot flashes and sweating so we will check a TSH today.  Exam: Abdomen: Soft nontender no rebound or guarding small scar from previous tubal ligation any the umbilicus was noted. Pelvic Bartholin urethra Ramiro Harvest was within normal limits Vagina: No gross lesions on inspection Cervix: No gross lesions on inspection When applying swab stick to the area of the uterosacral ligament she was very  tender. Bimanual exam uterus anteverted 10 to her on motion tender on the adnexa but no rebound or guarding and no large masses noted.  We went through a lengthy discussion of more than likely she has underlying endometriosis see she's having heavy painful periods along with dyspareunia and bloating I have recommended a laparoscopic-assisted vaginal hysterectomy with bilateral salpingectomy and possible laparotomy possible bilateral salpingo-oophorectomy depending on findings at time of surgery. She's ready to move forward with the surgery. Literature information on laparoscopic-assisted vaginal hysterectomy as well as on endometriosis was provided in [place]. We had given her other alternative treatment options to consider either Depo-Provera injection 150 mg IM every 3 months we will consider Lupron. She would like to proceed with surgical intervention. We'll make arrangements to schedule it in the next few weeks. She will stop the oral contraceptive pill since his not helping her with her symptoms and she's had previous tubal ligation.  Greater than 90% of time was spent counseling and coordinate care for this patient. Time of consultation 15 minutes

## 2016-11-16 NOTE — Patient Instructions (Signed)
Endometriosis (Endometriosis) La endometriosis es una enfermedad en la que el tejido que rodea al tero (endometrio) crece fuera de su ubicacin normal. El tejido puede crecer en muchos lugares cerca del tero, pero comnmente crece en los ovarios, las trompas de Falopio, la vagina o el intestino. Cuando el tero desprende el endometrio en cada ciclo menstrual, hay sangrado en el lugar donde se localiza el tejido endometrial. Esto puede causar dolor porque la sangre es irritante para los tejidos que no estn normalmente expuestos a Librarian, academic. CAUSAS Se desconoce la causa de la endometriosis. FACTORES DE RIESGO Puede tener ms probabilidades de tener endometriosis si:  Tiene antecedentes familiares de endometriosis.  No ha tenido hijos.  Inici su perodo menstrual a los 10aos de edad o menos.  Tiene un alto nivel de estrgeno en el cuerpo.  Estuvo expuesta a cierto medicamento (dietiletilbestrol) antes de Associate Professor (dentro del tero).  Tuvo bajo peso al [employer].  Tiene uno, dos o mltiples hermanos gemelos.  Tiene un [place] inferior a25. El [place] (ndice de masa corporal) es la estimacin de la grasa corporal y se calcula a partir de la altura y [place]. SNTOMAS A menudo, esta afeccin no presenta sntomas. Si tiene sntomas, estos pueden:  Variar segn dnde est creciendo el tejido endometrial.  Ocurrir durante el perodo menstrual (lo ms frecuente) o en la mitad del ciclo.  Ir y venir, o pueden pasar meses sin ningn sntoma.  Desaparecer con la menopausia. Entre los sntomas se pueden incluir los siguientes:  Dolor en la espalda o el abdomen.  Sangrado ms abundante durante los perodos [employer].  [place].  Dolor al defecar.  Infertilidad.  Dolor en la pelvis.  Tener ms de una hemorragia al [employer]. DIAGNSTICO Esta afeccin se diagnostica en funcin de los sntomas y de un examen fsico. Pueden hacerle estudios, por ejemplo:  Anlisis de  [place] y [place]. Estos estudios pueden servir para descartar otras causas posibles de sus sntomas.  Ecografa, para buscar tejidos anormales.  Radiografa del recto (enema de bario).  Una ecografa que se realiza a travs de la vagina (transvaginal).  Tomografa computarizada (TC).  Resonancia magntica (RM).  Una laparoscopia. En este procedimiento, se introduce en el abdomen un instrumento del tamao de un lpiz que tiene una luz, llamado laparoscopio, a travs de una incisin. El laparoscopio le permite al mdico observar los rganos internos y Hydrographic surveyor tejidos anormales para confirmar el diagnstico. Si se descubre la presencia de tejidos anormales, el mdico puede extraer una pequea cantidad de tejido (biopsia) para examinarlo con un microscopio. TRATAMIENTO El tratamiento de esta afeccin puede incluir lo siguiente:  Medicamentos para Best boy, por ejemplo, antiinflamatorios no esteroides (AINE).  Terapia hormonal. Esto significa utilizar hormonas artificiales (sintticas) para reducir Arboriculturist del tejido endometrial. El mdico puede recomendarle usar un mtodo anticonceptivo hormonal u otros medicamentos.  Someterse a [place]. Se puede realizar una ciruga para extraer el tejido endometrial anormal.  En algunos casos, el tejido se puede extraer utilizando un laparoscopio y Marine scientist (tratamiento laparoscpico con Animal nutritionist).  [employer] casos son graves, puede realizarse una ciruga para extirpar las trompas de [place], el tero y los ovarios (histerectoma). [place] los medicamentos de venta libre y los recetados solamente como se lo haya indicado el mdico.  No conduzca ni use maquinaria pesada mientras toma analgsicos recetados.  Trate de evitar actividades que produzcan dolor, incluida la actividad sexual.  Dallie Dad a todas  las visitas de control como se lo haya indicado el mdico. Esto es importante. SOLICITE  ATENCIN MDICA SI:  Siente dolor en la zona [employer] caderas (regin plvica), que ocurre:  Antes, durante o despus del perodo menstrual.  En medio del perodo y empeora durante el perodo.  Durante o despus [place].  Al defecar u orinar, especialmente durante el perodo menstrual.  Tiene dificultad para quedar embarazada.  Tiene fiebre. SOLICITE ATENCIN MDICA DE INMEDIATO SI:  Tiene un dolor intenso que no mejora con medicamentos.  Tiene nuseas y vmitos intensos, o no puede comer sin vomitar.  Siente un dolor que afecta solo la parte inferior derecha del abdomen.  Tiene un dolor abdominal que empeora.  Tiene hinchazn abdominal.  Observa sangre en la materia fecal. Esta informacin no tiene como fin reemplazar el consejo del mdico. Asegrese de hacerle al mdico cualquier pregunta que tenga. Document Released: 08/08/2005 Document Revised: 11/30/2015 Document Reviewed: 01/09/2016 Elsevier Interactive Patient Education  2017 [place] vaginal asistida por laparoscopa (Laparoscopically Assisted Vaginal Hysterectomy) La histerectoma vaginal asistida por laparoscopa (HVAL) es un procedimiento quirrgico para extirpar el tero y el cuello uterino, y en algunos casos los ovarios y las trompas de [place]. Durante la HVAL, cierta parte de la remocin [place] se realiza a travs de la vagina, y el resto a travs de pequeos cortes quirrgicos (incisiones) en el abdomen. Este procedimiento generalmente se indica en mujeres en las que la histerectoma vaginal no es una opcin. El mdico [employer] riesgos y beneficios de los diferentes tcnicas quirrgicas cuando concurra a la visita. Generalmente el tiempo de recuperacin es rpido y hay menos complicaciones luego de los procedimientos laparoscpicos que en los procedimientos de [place]. INFORME A SU MDICO:  Cualquier alergia que tenga.  Todos los [employer] [place],  incluyendo vitaminas, hierbas, gotas oftlmicas, cremas y medicamentos de venta libre.  Problemas previos que usted o los [employer] de su familia hayan tenido con el uso de anestsicos.  Enfermedades de [employer].  Cirugas previas.  Padecimientos mdicos. RIESGOS Y COMPLICACIONES Generalmente es un procedimiento seguro. Sin embargo, Games developer procedimiento, pueden surgir complicaciones. Las complicaciones posibles son:  Risk analyst a los medicamentos.  Dificultad para respirar.  Hemorragias.  Infeccin.  Dao a otras estructuras cercanas al tero y al cuello. ANTES DEL PROCEDIMIENTO  Consulte a su mdico si debe cambiar o suspender los medicamentos que toma habitualmente.  [employer], como los que necesita para prepararse vaciando el colon, segn las indicaciones.  No coma ni beba nada durante al menos 8 horas antes del procedimiento.  Si fuma, abandone el hbito. Si deja de fumar mejorar su salud despus de la [place].  Pdale a alguien que la lleve a su casa despus de la ciruga y la ayude en casa mientras se recupera. PROCEDIMIENTO  Le colocarn una va intravenosa (IV) en una vena para administrarle lquidos y medicamentos.  Recibir medicamentos para relajarse y otros que la harn dormir (anestesia general).  Le colocarn un tubo flexible (catter) en la vejiga para drenar la orina.  Tambin insertarn un tubo a travs de la nariz o la boca hacia el estmago (sonda nasogstrica). La sonda nasogstrica drena los jugos digestivos e impide que sienta nuseas o tenga vmitos.  Le colocarn medias ajustadas (compresin) en las piernas para Product manager.  Le practicarn tres o cuatro incisiones pequeas en el abdomen. Tambin le harn una incisin en la vagina. Le insertarn unas sondas e  instrumentos a travs de las pequeas incisiones. Extirparn el tero y el cuello uterino (y posiblemente los ovarios y las trompas de Falopio) a  travs de la vagina, as como a travs de las pequeas incisiones que se hicieron en el abdomen.  Luego la vagina se sutura a su estado normal. DESPUS DEL PROCEDIMIENTO  Posiblemente deba seguir una dieta lquida por un tiempo. Lo ms probable es volver a la dieta habitual y Engineer, structural bien al da siguiente de la [place].  Tendr que orinar a travs de un catter. Este se Teacher, English as a foreign language despus de la [place].  Le harn controles regulares de la temperatura, frecuencia cardaca y respiratoria, presin arterial y nivel de oxgeno.  Seguir [employer] Corporation de compresin en las piernas hasta que pueda moverse.  Usar un dispositivo especial o har ejercicios de respiracin para mantener los pulmones limpios.  La alentarn a caminar lo ms pronto posible. Esta informacin no tiene Marine scientist el consejo del mdico. Asegrese de hacerle al mdico cualquier pregunta que tenga. Document Released: 07/28/2011 Document Revised: 08/29/2014 Document Reviewed: 02/21/2013 Elsevier Interactive Patient Education  2017 [employer].

## 2016-11-17 LAB — TSH: TSH: 9.97 m[IU]/L — AB

## 2016-11-21 ENCOUNTER — Other Ambulatory Visit: Payer: Self-pay | Admitting: Anesthesiology

## 2016-11-21 DIAGNOSIS — E039 Hypothyroidism, unspecified: Secondary | ICD-10-CM

## 2016-11-21 MED ORDER — LEVOTHYROXINE SODIUM 137 MCG PO TABS: 137.0000 ug | ORAL_TABLET | Freq: Every day | ORAL | 12 refills | Status: DC

## 2017-01-03 ENCOUNTER — Encounter: Payer: Self-pay | Admitting: Gynecology

## 2017-01-03 ENCOUNTER — Ambulatory Visit (INDEPENDENT_AMBULATORY_CARE_PROVIDER_SITE_OTHER): Payer: 59 | Admitting: Gynecology

## 2017-01-03 VITALS — BP 128/80 | Wt 152.8 lb

## 2017-01-03 DIAGNOSIS — N809 Endometriosis, unspecified: Secondary | ICD-10-CM | POA: Diagnosis not present

## 2017-01-03 DIAGNOSIS — R197 Diarrhea, unspecified: Secondary | ICD-10-CM | POA: Diagnosis not present

## 2017-01-03 DIAGNOSIS — R102 Pelvic and perineal pain: Secondary | ICD-10-CM

## 2017-01-03 DIAGNOSIS — E038 Other specified hypothyroidism: Secondary | ICD-10-CM | POA: Diagnosis not present

## 2017-01-03 DIAGNOSIS — R140 Abdominal distension (gaseous): Secondary | ICD-10-CM

## 2017-01-03 MED ORDER — IBUPROFEN 800 MG PO TABS: 800.0000 mg | ORAL_TABLET | Freq: Three times a day (TID) | ORAL | 1 refills | Status: DC | PRN

## 2017-01-03 MED ORDER — LOPERAMIDE HCL 2 MG PO CAPS: ORAL_CAPSULE | ORAL | 1 refills | Status: DC

## 2017-01-03 MED ORDER — MEDROXYPROGESTERONE ACETATE 150 MG/ML IM SUSP
150.0000 mg | Freq: Once | INTRAMUSCULAR | Status: AC
  Administered 2017-01-03: 150 mg via INTRAMUSCULAR

## 2017-01-03 NOTE — Progress Notes (Addendum)
   Patient is a 46 year old gravida 4 para 4 that was last seen the office on March 28 complaining of 3 months history of low abdominal discomfort dyspareunia and bloating. She had been on continuous oral contraceptive pill and withdrawal every 3 months to see her symptoms improved in the event she may have had underlying endometriosis.She had an ultrasound in the office on 07/20/2016 which demonstrated the following:  Uterus measures 7.8 x 5.8 x 4.2 cm with endometrial stripe of 8.8 mm. Right and left ovary were normal several follicles were noted in both ovaries otherwise normal no fluid in the cul-de-sac.  As part of assessment and plan we discussed the following "Symptoms highly suspicious for possible endometriosis we discussed treatment options to include Depo-Provera injection every 3 months versus Lupron every 3 months 2 versus continuous low dose oral contraceptive pill and symptoms do not improve then proceed with laparoscopic evaluation. She has decided to proceed with a low dose 20 g oral contraceptive pill. She will be prescribed Aiane 20 g oral contraceptive pill to take continuously and withdrawal every 3 months. If this helps with her symptoms we will see her next year for annual exam or sooner if symptoms are not improved."  Patient stated she has not noticed any symptoms improvement but actually worse. Her PCP is treating her with lisinopril for her hypertension as well as being treated for hypothyroidism with Synthroid. Patient been complaining of hot flashes and sweating so a TSH was found to be elevated and since her PCP had her on Synthroid 125 g daily we adjusted to 137 g daily and is here to check her TSH level as well.  We had initially plan on scheduling a laparoscopic-assisted vaginal hysterectomy with possible bilateral salpingo-oophorectomy in the event of endometriosis the patient decided today that she would like to cancel and look at other alternatives. She will  return back to the office next week for an ultrasound to make sure that her ovaries continue to be normal. She will receive a shot of Depo-Provera 150 mg IM and will return every 3 months for injection of her symptoms improved. I'm also referred her to the gastroenterologist because she has stated that for several months she's continued to have diarrhea especially after she eats she has very loose stools. She denies any overseas travel.. If after Depo-Provera injection every 3 months and if her GI evaluation is negative she states that then she will consider then proceeding with recommended laparoscopic-assisted vaginal hysterectomy with possible bilateral salpingo-oophorectomy. Literature information all the above was provided in [place].  Gravida 90% of time was spent counseling quantity care of this patient. Time of consultation 25 minutes

## 2017-01-03 NOTE — Patient Instructions (Addendum)
Endometriosis (Endometriosis) La endometriosis es una enfermedad en la que el tejido que rodea al tero (endometrio) crece fuera de su ubicacin normal. El tejido puede crecer en muchos lugares cerca del tero, pero comnmente crece en los ovarios, las trompas de [place], la vagina o el intestino. Cuando el tero desprende el endometrio en cada ciclo menstrual, hay sangrado en el lugar donde se localiza el tejido endometrial. Esto puede causar dolor porque la sangre es irritante para los tejidos que no estn normalmente expuestos a Librarian, academic. CAUSAS Se desconoce la causa de la endometriosis. FACTORES DE RIESGO Puede tener ms probabilidades de tener endometriosis si:  Tiene antecedentes familiares de endometriosis.  No ha tenido hijos.  Inici su perodo menstrual a los 10aos de edad o menos.  Tiene un alto nivel de estrgeno en el cuerpo.  Estuvo expuesta a cierto medicamento (dietiletilbestrol) antes de Associate Professor (dentro del tero).  Tuvo bajo peso al [employer].  Tiene uno, dos o mltiples hermanos gemelos.  Tiene un [place] inferior a25. El [place] (ndice de masa corporal) es la estimacin de la grasa corporal y se calcula a partir de la altura y [place]. SNTOMAS A menudo, esta afeccin no presenta sntomas. Si tiene sntomas, estos pueden:  Variar segn dnde est creciendo el tejido endometrial.  Ocurrir durante el perodo menstrual (lo ms frecuente) o en la mitad del ciclo.  Ir y venir, o pueden pasar meses sin ningn sntoma.  Desaparecer con la menopausia. Entre los sntomas se pueden incluir los siguientes:  Dolor en la espalda o el abdomen.  Sangrado ms abundante durante los perodos [employer].  [place].  Dolor al defecar.  Infertilidad.  Dolor en la pelvis.  Tener ms de una hemorragia al [employer]. DIAGNSTICO Esta afeccin se diagnostica en funcin de los sntomas y de un examen fsico. Pueden hacerle estudios, por ejemplo:  Anlisis de  [place] y [place]. Estos estudios pueden servir para descartar otras causas posibles de sus sntomas.  Ecografa, para buscar tejidos anormales.  Radiografa del recto (enema de bario).  Una ecografa que se realiza a travs de la vagina (transvaginal).  Tomografa computarizada (TC).  Resonancia magntica (RM).  Una laparoscopia. En este procedimiento, se introduce en el abdomen un instrumento del tamao de un lpiz que tiene una luz, llamado laparoscopio, a travs de una incisin. El laparoscopio le permite al mdico observar los rganos internos y Hydrographic surveyor tejidos anormales para confirmar el diagnstico. Si se descubre la presencia de tejidos anormales, el mdico puede extraer una pequea cantidad de tejido (biopsia) para examinarlo con un microscopio. TRATAMIENTO El tratamiento de esta afeccin puede incluir lo siguiente:  Medicamentos para Best boy, por ejemplo, antiinflamatorios no esteroides (AINE).  Terapia hormonal. Esto significa utilizar hormonas artificiales (sintticas) para reducir Arboriculturist del tejido endometrial. El mdico puede recomendarle usar un mtodo anticonceptivo hormonal u otros medicamentos.  Someterse a [place]. Se puede realizar una ciruga para extraer el tejido endometrial anormal.  En algunos casos, el tejido se puede extraer utilizando un laparoscopio y Marine scientist (tratamiento laparoscpico con Animal nutritionist).  [employer] casos son graves, puede realizarse una ciruga para extirpar las trompas de [place], el tero y los ovarios (histerectoma). [place] los medicamentos de venta libre y los recetados solamente como se lo haya indicado el mdico.  No conduzca ni use maquinaria pesada mientras toma analgsicos recetados.  Trate de evitar actividades que produzcan dolor, incluida la actividad sexual.  Dallie Dad a todas  las visitas de control como se lo haya indicado el mdico. Esto es importante. SOLICITE  ATENCIN MDICA SI:  Siente dolor en la zona [employer] caderas (regin plvica), que ocurre:  Antes, durante o despus del perodo menstrual.  En medio del perodo y empeora durante el perodo.  Durante o despus [place].  Al defecar u orinar, especialmente durante el perodo menstrual.  Tiene dificultad para quedar embarazada.  Tiene fiebre. SOLICITE ATENCIN MDICA DE INMEDIATO SI:  Tiene un dolor intenso que no mejora con medicamentos.  Tiene nuseas y vmitos intensos, o no puede comer sin vomitar.  Siente un dolor que afecta solo la parte inferior derecha del abdomen.  Tiene un dolor abdominal que empeora.  Tiene hinchazn abdominal.  Observa sangre en la materia fecal. Esta informacin no tiene como fin reemplazar el consejo del mdico. Asegrese de hacerle al mdico cualquier pregunta que tenga. Document Released: 08/08/2005 Document Revised: 11/30/2015 Document Reviewed: 01/09/2016 Elsevier Interactive Patient Education  2017 [place]. Medroxyprogesterone injection [Contraceptive] Qu es este medicamento? Las inyecciones anticonceptivas de MEDROXIPROGESTERONA previenen Water quality scientist. Las [employer] brindarn control de la natalidad durante 3 meses. La Depo-subQ Provera 104 se utiliza tambin para tratar [employer] relacionado con endometriosis. Este medicamento puede ser utilizado para otros usos; si tiene alguna pregunta consulte con su proveedor de atencin mdica o con su farmacutico. MARCAS COMUNES: Depo-Provera, Depo-subQ Provera 104 Qu le debo informar a mi profesional de la salud antes de tomar este medicamento? Necesita saber si usted presenta alguno de los siguientes problemas o situaciones: -si consume alcohol con frecuencia -asma -enfermedad vascular o antecedente de cogulos sanguneos en los pulmones o las piernas -enfermedad de los [place], como osteoporosis -cncer de mama -diabetes -trastornos de la alimentacin (anorexia  nerviosa o bulimia) -alta presin sangunea -infecciones por VIH o SIDA -enfermedad renal -enfermedad heptica -depresin mental -migraa -convulsiones -derrame cerebral -fuma tabaco -sangrado vaginal -una reaccin alrgica o inusual a la medroxiprogesterona, otras hormonas, otros medicamentos, alimentos, colorantes o conservantes -si est embarazada o buscando quedar embarazada -si est amamantando a un beb Cmo debo utilizar este medicamento? Depo-Provera Contraceptive inyectable se administra en un msculo. Depo-subQ Provera 104 inyectable se administra por va subcutnea. Estas inyecciones las administra un profesional de la salud. No debe estar embarazada antes de recibir una inyeccin. Esta inyeccin generalmente se aplica durante los primeros 5 das del inicio de un periodo menstrual o 6 semanas despus de haber tenido un beb. Hable con su pediatra para informarse acerca del uso de este medicamento en nios. Puede requerir atencin especial. Estas inyecciones han sido usadas en nias que han empezado a tener perodos [place]. Sobredosis: Pngase en contacto inmediatamente con un centro toxicolgico o una sala de urgencia si usted cree que haya tomado demasiado medicamento. ATENCIN: [employer] es solo para usted. No comparta este medicamento con nadie. Qu sucede si me olvido de una dosis? Trate de no olvidar ninguna dosis. Para mantener el control de natalidad necesitar una inyeccin cada 3 meses. Si no puede asistir a una cita, comunquese con su profesional de la salud para que se la [place]. Si espera ms de 13 semanas entre las inyecciones anticonceptivos de Depo-Provera o ms de 14 semanas entre inyecciones anticonceptivos de Depo-subQ Provera 104, puede quedarse [place]. Si no puede asistir a su cita utilice otro mtodo anticonceptivo. Tal vez deba hacerse una prueba de embarazo antes de recibir otra inyeccin. Qu puede interactuar con este medicamento? No tome  esta medicina con ninguno de  los siguientes medicamentos: -bosentano Esta medicina tambin puede interactuar con los siguientes medicamentos: -aminoglutethimide -antibiticos o medicamentos para infecciones, especialmente rifampicina, rifabutina, rifapentina y griseofulvina -aprepitant -barbitricos, tales como el fenobarbital o primidona -bexaroteno -carbamazepina -medicamentos para convulsiones, tales como etotona, felbamato, [place], [place], topiramato -modafinilo -hierba de [place] Puede ser que esta lista no menciona todas las posibles interacciones. Informe a su profesional de KB Home	[place] de [employer] Corporation productos a base de hierbas, medicamentos de [place] o suplementos nutritivos que est tomando. Si usted fuma, consume bebidas alcohlicas o si utiliza drogas ilegales, indqueselo tambin a su profesional de KB Home	[place]. Algunas sustancias pueden interactuar con su medicamento. A qu debo estar atento al usar Coca-Cola? Este medicamento no la protege de la infeccin por VIH (SIDA) ni de otras enfermedades de transmisin sexual. El uso de este producto puede provocar una prdida de calcio de sus huesos. La prdida de calcio puede provocar huesos dbiles (osteoporosis). Slo use este producto durante ms de 2 aos si otras formas de anticonceptivos no son apropiados para usted. Mientre ms tiempo use este producto para el control de la natalidad, tendr ms riesgo de Insurance risk surveyor de [employer]. Consulte a su profesional de Pharmacist, hospital de cmo puede Exxon [employer]il Corporation fuertes. Puede experimentar un cambio en el patrn de sangrado o periodos irregulares. Muchas mujeres dejan de tener periodos [employer]. Si recibe sus inyecciones a tiempo, la posibilidad de quedarse embarazada es muy baja. Si cree que podr [employer] Corporation, visite a su profesional de la salud lo antes posible. Si desea quedar embarazada dentro del prximo ao, informe a su profesional de [employer] Corporation. El [place] de este medicamento puede perdurar durante mucho tiempo despus de recibir su ltima inyeccin. Qu efectos secundarios puedo tener al [employer] este medicamento? Efectos secundarios que debe informar a su mdico o a Barrister's clerk de la salud tan pronto como sea posible: -Chief of Staff como erupcin cutnea, picazn o urticarias, hinchazn de la cara, labios o lengua -secreciones o sensibilidad de las mamas -problemas respiratorios -cambios en la visin -depresin -sensacin de desmayos o mareos, cadas -fiebre -dolor en el abdomen, pecho, entrepierna o piernas -problemas de coordinacin, del habla, al caminar -cansancio o debilidad inusual -color amarillento de los ojos o la piel Efectos secundarios que, por lo general, no requieren Geophysical data processor (debe informarlos a mdico o a Barrister's clerk de la salud si persisten o si son molestos): -cne -retencin de lquidos e hinchazn -dolor de cabeza -perodos menstruales irregulares, manchando o ausencia de perodos menstruales -dolor, picazn o reaccin cutnea temporal en el lugar de la inyeccin -aumento de peso Puede ser que esta lista no menciona todos los posibles efectos secundarios. Comunquese a su mdico por asesoramiento mdico [employer]. Usted puede informar los efectos secundarios a la FDA por telfono al 1-800-FDA-1088. Dnde debo guardar mi medicina? No se aplica en este caso. Un profesional de Probation officer las inyecciones. ATENCIN: Este folleto es un resumen. Puede ser que no cubra toda la posible informacin. Si usted tiene preguntas acerca de esta medicina, consulte con su mdico, su farmacutico o su profesional de Technical sales engineer.  2018 Elsevier/Gold Standard (2016-09-08 00:00:00) Diarrea sanguinolenta (Bloody Diarrhea) La diarrea sanguinolenta consiste en evacuaciones intestinales frecuentes, blandas o acuosas que contienen sangre. Es posible que sea difcil ver o notar  la sangre (oculta). La diarrea sanguinolenta puede ser causada por afecciones mdicas tales como:  Colitis ulcerosa.  Enfermedad de Crohn.  Infeccin intestinal.  Gastroenteritis viral o bacteriana. Es necesario Neurosurgeon por qu hay sangre en la diarrea para que el mdico pueda indicarle el tratamiento correcto para usted. Siga las instrucciones del mdico para el tratamiento de la causa de la diarrea sanguinolenta que lo afecta. Cualquier tipo de diarrea puede hacerlo sentir dbil y deshidratado. La deshidratacin puede hacerlo sentir cansado y sediento, producirle sequedad en la boca y disminuir la frecuencia con la que [place]. INSTRUCCIONES PARA EL CUIDADO EN EL HOGAR Siga las indicaciones del mdico sobre cmo debe cuidarse en su casa. Comida y bebida Siga estas recomendaciones como se lo haya indicado el mdico:  Tome una solucin de rehidratacin oral (SRO). Esta es una bebida que se vende en farmacias y tiendas.  Beba lquidos claros, como agua, paletas bajas en caloras y [place] de fruta rebajado (jugo de fruta diluido).  En la medida en que pueda, consuma alimentos blandos y fciles de digerir en pequeas cantidades. Estos alimentos incluyen bananas, compota de [place], arroz, carnes [place], tostadas y [place].  Evite consumir lquidos que contengan mucha azcar o cafena, como bebidas energticas, bebidas deportivas y refrescos.  Evite el alcohol.  Evite los alimentos picantes o grasos. Instrucciones generales  Beba suficiente lquido para mantener la orina clara o de color amarillo plido.  Lvese las manos con frecuencia. Use desinfectante para manos si no dispone de [place] y [place].  Asegrese de que todas las personas que viven en su casa se laven bien las manos y con frecuencia.  Tome los medicamentos de venta libre y los recetados solamente como se lo haya indicado el mdico.  Descanse en su casa mientras se recupera.  Tome un bao caliente para ayudar a Customer service manager ardor o el dolor causados por los episodios frecuentes de diarrea.  Controle su afeccin para ver si hay cambios.  Concurra a todas las visitas de control como se lo haya indicado el mdico. Esto es importante. SOLICITE ATENCIN MDICA SI:  Jaclynn Guarneri.  Aparecen nuevos sntomas.  La diarrea empeora.  No puede retener los lquidos.  Tiene dolores de [place].  Se siente mareado o siente que va a desvanecerse.  Tiene calambres musculares. SOLICITE ATENCIN MDICA DE INMEDIATO SI:  Siente dolor en el pecho.  Se siente muy dbil o se desmaya.  La sangre de la diarrea aumenta o cambia de color.  Vomita, y el vmito contiene sangre o es negro.  Tiene diarrea persistente.  Tiene dolor intenso, clicos, o meteorismo en el abdomen.  Le cuesta respirar o respira muy rpidamente.  Su corazn late muy rpidamente.  Siente la piel fra o hmeda.  Se siente confundido.  Tiene signos de deshidratacin, por ejemplo:  Elmon Else, muy escasa o falta de [place].  Labios agrietados.  [employer].  Ojos hundidos.  Somnolencia.  Debilidad. Esta informacin no tiene Marine scientist el consejo del mdico. Asegrese de hacerle al mdico cualquier pregunta que tenga. Document Released: 08/08/2005 Document Revised: 11/30/2015 Document Reviewed: 04/14/2015 Elsevier Interactive Patient Education  2017 [place]. Loperamide tablets or capsules Qu es este medicamento? La LOPERAMIDA se utiliza para tratar [employer]. Este medicamento puede ser utilizado para otros usos; si tiene alguna pregunta consulte con su proveedor de atencin mdica o con su farmacutico. MARCAS COMUNES: Anti-Diarrheal, Imodium A-D, [place] II Qu le debo informar a mi profesional de la salud antes de tomar este medicamento? Necesita saber si usted presenta alguno de los siguientes problemas o situaciones: -heces de color oscuro o con sangre -intoxicacin alimentaria bacteriana -colitis  o moco en  sus heces -si est tomando antibiticos para una infeccin actualmente -fiebre -enfermedad heptica -dolor abdominal severo, hinchazn -una reaccin alrgica o inusual a la loperamida, a otros medicamentos, alimentos, colorantes o conservantes -si est embarazada o buscando quedar embarazada -si est amamantando a un beb Cmo debo utilizar este medicamento? Tome este medicamento por va oral con un vaso de agua. Siga las instrucciones de la etiqueta del [place]. Tome sus dosis a intervalos regulares. No tome su medicamento con una frecuencia mayor a la indicada. Hable con su pediatra para informarse acerca del uso de este medicamento en nios. Puede requerir atencin especial. Sobredosis: Pngase en contacto inmediatamente con un centro toxicolgico o una sala de urgencia si usted cree que haya tomado demasiado medicamento. ATENCIN: [employer] es solo para usted. No comparta este medicamento con nadie. Qu sucede si me olvido de una dosis? No se aplica en este caso. Esta medicina no es para uso regular. Tome esta medicina solamente mientras que contina teniendo evacuacines intestinal flojas. No tome ms medicina que la recomendada por la etiqueta del medicamento o por su profesional de Technical sales engineer. Qu puede interactuar con este medicamento? No tome esta medicina con ninguno de los siguientes medicamentos: alosetrn Esta medicina tambin puede interactuar con los siguientes medicamentos: cimetidina claritromicina eritromicina gemfibrozil itraconazol ketoconazol quinidina quinina ranitidina ritonavir saquinavir Puede ser que esta lista no menciona todas las posibles interacciones. Informe a su profesional de KB Home	[place] de [employer] Corporation productos a base de hierbas, medicamentos de [place] o suplementos nutritivos que est tomando. Si usted fuma, consume bebidas alcohlicas o si utiliza drogas ilegales, indqueselo tambin a su profesional de KB Home	[place]. Algunas sustancias pueden  interactuar con su medicamento. A qu debo estar atento al usar Coca-Cola? No tome este medicamento durante ms de 2 das sin consultar a su mdico o profesional de Technical sales engineer. No use dosis superiores a las que le recet su mdico o a las Press photographer. Consulte a su mdico o profesional de la salud de inmediato si tiene fiebre, dolor abdominal intenso, inflamacin o hinchazn, o si tiene diarrea o heces con sangre/de color oscuro. Puede experimentar somnolencia o mareos. No conduzca, no utilice maquinaria ni haga nada que Associate Professor en estado de alerta hasta que sepa cmo le afecta este medicamento. No se siente ni se ponga de pie con rapidez, especialmente si es un paciente de edad avanzada. Esto reduce el riesgo de mareos o [employer]. El alcohol puede aumentar la posibilidad de somnolencia y [place]. Evite consumir bebidas alcohlicas. Se le podra secar la boca. Masticar chicle sin azcar, chupar caramelos duros y tomar agua en abundancia lo ayudar a mantener la boca hmeda. Si el problema no desaparece o es severo, consulte a su mdico. Electronics engineer agua en abundancia tambin puede ayudar a prevenir la deshidratacin que se puede producir con Building services engineer. Los pacientes de edad avanzada puede tener una respuesta ms variable a los efectos de [place], y son ms susceptibles a los efectos de la deshidratacin. Qu efectos secundarios puedo tener al [employer] Corporation este medicamento? Efectos secundarios que debe informar a su mdico o a Barrister's clerk de la salud tan pronto como sea posible: Chief of Staff, como erupcin cutnea, comezn/picazn o urticarias, e hinchazn de la cara, los labios o la lengua distensin, sensacin de hinchazn en el abdomen visin borrosa prdida del apetito signos y sntomas de un cambio peligroso en el pulso o ritmo cardiaco, tales como dolor en el pecho; mareos;  ritmo cardiaco rpido o irregular; palpitaciones; sensacin de desmayos o aturdimiento,  cadas; problemas respiratorios dolor estomacal Efectos secundarios que generalmente no requieren atencin mdica (infrmelos a su mdico o a su profesional de la salud si persisten o si son molestos): estreimiento somnolencia o mareos boca seca nuseas, vmito Puede ser que esta lista no menciona todos los posibles efectos secundarios. Comunquese a su mdico por asesoramiento mdico [employer] Inc. Usted puede informar los efectos secundarios a la FDA por telfono al 1-800-FDA-1088. Dnde debo guardar mi medicina? Mantngala fuera del alcance de los nios. Gurdela a temperatura ambiente de entre 15 y 41 grados C (59 y 67 grados F). Mantenga el envase bien cerrado. Deseche todo el medicamento que no haya utilizado, despus de la fecha de vencimiento. ATENCIN: Este folleto es un resumen. Puede ser que no cubra toda la posible informacin. Si usted tiene preguntas acerca de esta medicina, consulte con su mdico, su farmacutico o su profesional de Technical sales engineer.  2018 Elsevier/Gold Standard (2016-09-08 00:00:00)   Citica (Sciatica) La citica es el dolor, entumecimiento, debilidad u hormigueo a lo largo del nervio citico. El nervio citico comienza en la parte inferior de la espalda y desciende por la parte posterior de cada pierna. Controla los msculos en la parte inferior de las piernas y en la parte posterior de las rodillas. Tambin otorga sensibilidad a la parte posterior de los muslos, la parte inferior de las piernas y la planta de los pies. La citica es un sntoma de otra afeccin que ejerce presin o "pellizca" el nervio citico. Generalmente la citica afecta slo un lado del cuerpo. Suele desaparecer por s sola o con tratamiento. En algunos casos, la Geographical information systems officer a Arts administrator . CAUSAS Esta afeccin causa presin sobre el nervio citico o lo "pellizca". Esto puede ser el resultado de:  Un disco que sobresale demasiado (hernia de disco) entre los huesos de la  columna vertebral (vrtebras).  Cambios relacionados con la edad en los discos de la columna vertebral (discopata degenerativa).  Un trastorno doloroso que afecta un msculo de los glteos (sndrome piriforme).  Un crecimiento seo adicional (espoln seo) cerca del nervio citico.  Una lesin o fractura de la pelvis.  Embarazo.  Tumor (poco frecuente). [place] pueden hacer que usted sea propenso a sufrir esta afeccin:  Sales promotion account executive los que se ejerce presin sobre la columna vertebral o en los que la columna realiza mucho esfuerzo, como el ftbol americano o el levantamiento de pesas.  Tener poca fuerza y flexibilidad.  Antecedentes mdicos de lesiones en la espalda.  Antecedentes mdicos de ciruga en la espalda.  Estar sentado durante largos perodos.  Realizar actividades que requieren agacharse o levantar objetos en forma repetida.  Obesidad. SNTOMAS Los sntomas pueden ser leves o graves, y pueden incluir los siguientes:  Cualquiera de los siguientes problemas en la parte inferior de la espalda, piernas, cadera o glteos:  Hormigueo leve o dolor sordo.  Sensacin de ardor.  Dolor agudo.  Adormecimiento de la parte posterior de la pantorrilla o la planta del pie.  Debilidad en las piernas.  Dolor de espalda intenso que dificulta el movimiento. Estos sntomas podran empeorar al toser, estornudar o rerse, o cuando se est sentado o de pie durante perodos prolongados. El sobrepeso tambin puede [place] sntomas. En algunos casos, los sntomas regresan luego de un [place]. DIAGNSTICO Esta afeccin se puede diagnosticar en funcin de lo siguiente:  Sus sntomas.  Un examen fsico.  El mdico podra indicarle que realice ciertos movimientos para controlar si estos desencadenan los sntomas.  Tambin pueden hacerle exmenes que incluyen lo siguiente:  Anlisis de [place].  Radiografas.  Resonancia magntica  (RM).  Tomografa computarizada (TC). TRATAMIENTO En muchos casos, esta afeccin mejora por s sola, sin ningn tratamiento. Sin embargo, Dispensing optician puede incluir lo siguiente:  Reduccin o modificacin de la actividad fsica en los perodos de Social research officer, government.  Ejercicios y estiramiento para fortalecer el abdomen y Teacher, English as a foreign language la flexibilidad de la columna vertebral.  Aplicacin de calor o hielo en la zona afectada.  Medicamentos para lo siguiente:  Best boy y la inflamacin.  Relajar los msculos.  Medicamentos inyectables que ayudan a Best boy, la irritacin y la inflamacin alrededor del nervio citico (esteroides).  Ciruga. [place] los medicamentos de venta libre y los recetados solamente como se lo haya indicado el mdico.  No conduzca ni opere maquinaria pesada mientras toma analgsicos recetados. Control del dolor  Si se lo indican, aplique hielo en la zona afectada.  Ponga el hielo en una bolsa plstica.  Coloque una toalla entre la piel y la bolsa de hielo.  Coloque el hielo durante 20 minutos, 2 a 3 veces por da.  Despus del hielo, aplique calor sobre la zona afectada antes de Optometrist ejercicio o con la frecuencia que le haya indicado el mdico. Use la fuente de calor que el mdico le recomiende, como una compresa de calor hmedo o una almohadilla trmica.  Coloque una toalla entre la piel y la fuente de Freight forwarder.  Aplique el calor durante 20 a 43mnutos.  Retire la fuente de calor si la piel se le pone de color rojo brillante. Esto es muy importante si no puede sentir el dolor, el calor o el fro. Puede correr un riesgo mayor de sufrir quemaduras. Actividad  Reanude sus actividades normales como se lo haya indicado el mdico. Pregntele al mdico qu actividades son seguras para usted.  Evite las aThe Northwestern Mutualsntomas.  Durante el da, descanse durante lapsos breves. Descansar  recostado o de pie suele ser mejor que hacerlo sentado.  Cuando descanse durante perodos ms largos, incorpore alguna aRwandao ejercicios de eAffiliated Computer Servicesperodos. Esto ayudar a eMining engineerrigidez y eConservation officer, historic buildings  Evite estar sentado durante largos perodos sin moverse. Levntese y mRose Cityal menos una vez cada hora.  Haga ejercicio y elongue habitualmente, como se lo haya indicado el mdico.  No levante nada que pese ms de 10libras (4,5kg) mientras tenga sntomas de citica. Aunque no tenga sntomas, evite levantar objetos pesados, en especial en forma repetida.  Siempre use las tcnicas de levantamiento correctas para levantar objetos, entre ellas:  Flexionar las rodillas.  Mantener la carga cerca del cuerpo.  No torcerse. Instrucciones generales  Mantenga una buena postura.  Evite reclinarse hacia adelante cuando est sentado.  Evite encorvar la espalda mientras est de pie.  Mantenga un peso saludable. El exceso de peso ejerce presin adicional sobre la espalda y hace que resulte difcil mantener una buena pNora  Use calzado con buen apoyo y cmodo. Evite usar tacones.  Evite dormir sobre un colchn que sea demasiado blando o demasiado duro. Un colchn que ofrezca un apoyo suficientemente firme para su espalda al dormir puede ayudar a aBest boy  Concurra a todas las visitas de control como se lo haya indicado el mdico. Esto es importante. SOLICITE ATENCIN MDICA SI:  El  dolor lo despierta cuando est dormido.  El dolor empeora cuando se [place].  El dolor es peor del que experiment en el pasado.  Los sntomas duran ms de 4 semanas.  Pierde peso en forma inexplicable. SOLICITE ATENCIN MDICA DE INMEDIATO SI:  Pierde el control de la vejiga o del intestino (incontinencia).  Tiene los siguientes sntomas:  Debilidad que empeora en la parte inferior de la espalda, la pelvis, los glteos o las piernas.  Enrojecimiento o inflamacin en la  espalda.  Sensacin de ardor al [employer]. Esta informacin no tiene Marine scientist el consejo del mdico. Asegrese de hacerle al mdico cualquier pregunta que tenga. Document Released: 08/08/2005 Document Revised: 11/30/2015 Document Reviewed: 04/17/2015 Elsevier Interactive Patient Education  2017 [employer].

## 2017-01-04 ENCOUNTER — Encounter: Payer: Self-pay | Admitting: Gynecology

## 2017-01-04 ENCOUNTER — Telehealth: Payer: Self-pay | Admitting: *Deleted

## 2017-01-04 DIAGNOSIS — R140 Abdominal distension (gaseous): Secondary | ICD-10-CM

## 2017-01-04 DIAGNOSIS — R197 Diarrhea, unspecified: Secondary | ICD-10-CM

## 2017-01-04 LAB — TSH: TSH: 0.9 mIU/L

## 2017-01-04 NOTE — Telephone Encounter (Signed)
-----   Message from Ramond Craver, [place] sent at 01/03/2017  4:39 PM EDT ----- Regarding: referral to GI Per Dr. JF--Needs consult with GI ASAP like next week due to 5 month history of diarrhea and bloating.

## 2017-01-04 NOTE — Telephone Encounter (Signed)
Referral placed GI will call pt to schedule.

## 2017-01-07 ENCOUNTER — Emergency Department (HOSPITAL_COMMUNITY)
Admission: EM | Admit: 2017-01-07 | Discharge: 2017-01-07 | Disposition: A | Discharge status: Home / Self Care | Payer: No Typology Code available for payment source | Attending: Emergency Medicine | Admitting: Emergency Medicine

## 2017-01-07 ENCOUNTER — Encounter (HOSPITAL_COMMUNITY): Payer: Self-pay | Admitting: Emergency Medicine

## 2017-01-07 DIAGNOSIS — R197 Diarrhea, unspecified: Secondary | ICD-10-CM | POA: Insufficient documentation

## 2017-01-07 DIAGNOSIS — I10 Essential (primary) hypertension: Secondary | ICD-10-CM | POA: Insufficient documentation

## 2017-01-07 DIAGNOSIS — Z79899 Other long term (current) drug therapy: Secondary | ICD-10-CM | POA: Insufficient documentation

## 2017-01-07 DIAGNOSIS — J45909 Unspecified asthma, uncomplicated: Secondary | ICD-10-CM | POA: Insufficient documentation

## 2017-01-07 HISTORY — DX: Unspecified asthma, uncomplicated: J45.909

## 2017-01-07 HISTORY — DX: Essential (primary) hypertension: I10

## 2017-01-07 LAB — I-STAT BETA HCG BLOOD, ED (MC, WL, AP ONLY)

## 2017-01-07 LAB — CBC
HCT: 37.8 % (ref 36.0–46.0)
HEMOGLOBIN: 12.9 g/dL (ref 12.0–15.0)
MCH: 30.1 pg (ref 26.0–34.0)
MCHC: 34.1 g/dL (ref 30.0–36.0)
MCV: 88.1 fL (ref 78.0–100.0)
PLATELETS: 313 10*3/uL (ref 150–400)
RBC: 4.29 MIL/uL (ref 3.87–5.11)
RDW: 13.5 % (ref 11.5–15.5)
WBC: 9.9 10*3/uL (ref 4.0–10.5)

## 2017-01-07 LAB — LIPASE, BLOOD: Lipase: 26 U/L (ref 11–51)

## 2017-01-07 LAB — URINALYSIS, ROUTINE W REFLEX MICROSCOPIC
Bilirubin Urine: NEGATIVE
Glucose, UA: NEGATIVE mg/dL
HGB URINE DIPSTICK: NEGATIVE
Ketones, ur: NEGATIVE mg/dL
LEUKOCYTES UA: NEGATIVE
Nitrite: NEGATIVE
PROTEIN: NEGATIVE mg/dL
SPECIFIC GRAVITY, URINE: 1.009 (ref 1.005–1.030)
pH: 6 (ref 5.0–8.0)

## 2017-01-07 LAB — COMPREHENSIVE METABOLIC PANEL
ALT: 39 U/L (ref 14–54)
ANION GAP: 7 (ref 5–15)
AST: 30 U/L (ref 15–41)
Albumin: 3.7 g/dL (ref 3.5–5.0)
Alkaline Phosphatase: 58 U/L (ref 38–126)
BUN: 8 mg/dL (ref 6–20)
CALCIUM: 8.8 mg/dL — AB (ref 8.9–10.3)
CHLORIDE: 107 mmol/L (ref 101–111)
CO2: 21 mmol/L — AB (ref 22–32)
CREATININE: 0.78 mg/dL (ref 0.44–1.00)
Glucose, Bld: 101 mg/dL — ABNORMAL HIGH (ref 65–99)
Potassium: 3.5 mmol/L (ref 3.5–5.1)
SODIUM: 135 mmol/L (ref 135–145)
Total Bilirubin: 0.5 mg/dL (ref 0.3–1.2)
Total Protein: 7.1 g/dL (ref 6.5–8.1)

## 2017-01-07 MED ORDER — FLORANEX PO PACK: 1.0000 g | PACK | Freq: Three times a day (TID) | ORAL | 1 refills | Status: DC

## 2017-01-07 MED ORDER — SODIUM CHLORIDE 0.9 % IV BOLUS (SEPSIS)
1000.0000 mL | Freq: Once | INTRAVENOUS | Status: AC
  Administered 2017-01-07: 1000 mL via INTRAVENOUS

## 2017-01-07 MED ORDER — OMEPRAZOLE 40 MG PO CPDR: 40.0000 mg | DELAYED_RELEASE_CAPSULE | Freq: Every day | ORAL | 0 refills | Status: DC

## 2017-01-07 NOTE — ED Notes (Signed)
Pt stable, understands discharge instructions, and reasons for return.

## 2017-01-07 NOTE — ED Provider Notes (Signed)
[facility] DEPT Provider Note   CSN: [id number] Arrival date & time: 01/07/17  1129     History   Chief Complaint Chief Complaint  Patient presents with  . Diarrhea  . Abdominal Pain    HPI Julia Green is a 46 y.o. female.  Patient presents with complaint of diarrhea, watery, nonbloody frequently over the past 3-4 months. She has seen her primary care physician for this and has been prescribed Lomotil, Bentyl, Imodium. Patient states that she has been taking these without any significant relief. She has had some pain at times across her lower abdomen. She has had nausea but no vomiting. No history of surgeries. No recent travel or suspicious food exposures. No other workup or follow-up for her issues. Denies fever, chest pain, shortness of breath, urinary symptoms. No known sick contacts. Onset of symptoms acute. Course is persistent. Nothing makes symptoms better or worse.      Past Medical History:  Diagnosis Date  . Asthma   . Hypertension   . Thyroid disease     Patient Active Problem List   Diagnosis Date Noted  . Other specified hypothyroidism 11/16/2016  . Dysmenorrhea 07/20/2016  . 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  . Dyspareunia, female 06/16/2016    No past surgical history on file.  OB History    Gravida Para Term Preterm AB Living   4 4       4    SAB TAB Ectopic Multiple Live Births                   Home Medications    Prior to Admission medications   Medication Sig Start Date End Date Taking? Authorizing Provider  ibuprofen (ADVIL,MOTRIN) 800 MG tablet Take 1 tablet (800 mg total) by mouth every 8 (eight) hours as needed. 01/03/17   Terrance Mass, MD  levothyroxine (SYNTHROID, LEVOTHROID) 137 MCG tablet Take 1 tablet (137 mcg total) by mouth daily before breakfast. 11/21/16   Terrance Mass, MD  lisinopril-hydrochlorothiazide (PRINZIDE,ZESTORETIC) 10-12.5 MG tablet Take 1 tablet by mouth daily.    [provider]  loperamide  (IMODIUM) 2 MG capsule Take two tablets initialy then one after each loose stool 01/03/17   Terrance Mass, MD    Family History Family History  Problem Relation Age of Onset  . Cancer Mother        UTERINE   . Hypertension Mother     Social History Social History  Substance Use Topics  . Smoking status: Never Smoker  . Smokeless tobacco: Never Used  . Alcohol use No     Allergies   Patient has no known allergies.   Review of Systems Review of Systems  Constitutional: Negative for fever.  HENT: Negative for rhinorrhea and sore throat.   Eyes: Negative for redness.  Respiratory: Negative for cough.   Cardiovascular: Negative for chest pain.  Gastrointestinal: Positive for abdominal pain, diarrhea and nausea. Negative for blood in stool, constipation, rectal pain and vomiting.  Genitourinary: Negative for dysuria.  Musculoskeletal: Negative for myalgias.  Skin: Negative for rash.  Neurological: Negative for headaches.     Physical Exam Updated Vital Signs BP 117/72 (BP Location: Left Arm)   Pulse 63   Temp 98.9 F (37.2 C) (Oral)   Resp 18   LMP 12/16/2016   SpO2 100%   Physical Exam  Constitutional: She appears well-developed and well-nourished.  HENT:  Head: Normocephalic and atraumatic.  Mouth/Throat: Oropharynx is clear and moist.  Eyes: Conjunctivae are  normal. Right eye exhibits no discharge. Left eye exhibits no discharge.  Neck: Normal range of motion. Neck supple.  Cardiovascular: Normal rate, regular rhythm and normal heart sounds.   Pulmonary/Chest: Effort normal and breath sounds normal. No respiratory distress. She has no wheezes. She has no rales.  Abdominal: Soft. There is no tenderness. There is no rebound and no guarding.  Neurological: She is alert.  Skin: Skin is warm and dry.  Psychiatric: She has a normal mood and affect.  Nursing note and vitals reviewed.    ED Treatments / Results  Labs (all labs ordered are listed, but only  abnormal results are displayed) Labs Reviewed  COMPREHENSIVE METABOLIC PANEL - Abnormal; Notable for the following:       Result Value   CO2 21 (*)    Glucose, Bld 101 (*)    Calcium 8.8 (*)    All other components within normal limits  LIPASE, BLOOD  CBC  URINALYSIS, ROUTINE W REFLEX MICROSCOPIC  I-STAT BETA HCG BLOOD, ED ([facility], [facility], [facility] ONLY)    Procedures Procedures (including critical care time)  Medications Ordered in ED Medications  sodium chloride 0.9 % bolus 1,000 mL (1,000 mLs Intravenous New Bag/Given 01/07/17 1455)     Initial Impression / Assessment and Plan / ED Course  I have reviewed the triage vital signs and the nursing notes.  Pertinent labs & imaging results that were available during my care of the patient were reviewed by me and considered in my medical decision making (see chart for details).     Patient seen and examined. Work-up reviewed. Given persistent diarrhea, will treat with fluid bolus. Medications reviewed.  Vital signs reviewed and are as follows: BP 117/72 (BP Location: Left Arm)   Pulse 63   Temp 98.9 F (37.2 C) (Oral)   Resp 18   LMP 12/16/2016   SpO2 100%   Reviewed results with patient via interpreter phone.  Will start on probiotics. Will give GI referral. She may continue Imodium or Lomotil if she feels like they provide any relief. Otherwise promoted good hydration and oral intake.  The patient was urged to return to the Emergency Department immediately with worsening of current symptoms, worsening abdominal pain, persistent vomiting, blood noted in stools, fever, or any other concerns. The patient verbalized understanding.    Final Clinical Impressions(s) / ED Diagnoses   Final diagnoses:  Diarrhea, unspecified type   Patient with chronic diarrhea. Electrolytes are normal. No objective signs of dehydration. Patient will benefit from GI workup. No focal abdominal pain, fever, blood in the stool. No indication for advanced  imaging at this time. Unfortunately the usual outpatient therapies have not improved this patient's symptoms. Will start on probiotics. Continue conservative measures.    New Prescriptions New Prescriptions   LACTOBACILLUS (FLORANEX/LACTINEX) PACK    Take 1 packet (1 g total) by mouth 3 (three) times daily with meals.     Carlisle Cater, PA-C 01/07/17 9528    Daleen Bo, MD 01/07/17 [phone]

## 2017-01-07 NOTE — ED Notes (Signed)
Pt ambulating to restroom for urine sample.

## 2017-01-07 NOTE — ED Notes (Signed)
Pt aware that urine sample is needed, but is unable to provide one at this time 

## 2017-01-07 NOTE — ED Triage Notes (Addendum)
States diarrhea and abd pain x 3 months  Has taken meds but has  Not been helping

## 2017-01-07 NOTE — Discharge Instructions (Signed)
Please read and follow all provided instructions.  Your diagnoses today include:  1. Diarrhea, unspecified type     Tests performed today include:  Blood counts and electrolytes  Blood tests to check liver and kidney function  Blood tests to check pancreas function  Urine test to look for infection and pregnancy (in women)  Vital signs. See below for your results today.   Medications prescribed:   Probiotic   Take any prescribed medications only as directed.  Home care instructions:   Follow any educational materials contained in this packet.   Follow-up instructions: Please follow-up with the gastroenterologist as directed.   Return instructions:  SEEK IMMEDIATE MEDICAL ATTENTION IF:  If you have pain that does not go away or becomes severe   A temperature above 101F develops   Repeated vomiting occurs (multiple episodes)   If you have pain that becomes localized to portions of the abdomen. The right side could possibly be appendicitis. In an adult, the left lower portion of the abdomen could be colitis or diverticulitis.   Blood is being passed in stools or vomit (bright red or black tarry stools)   You develop chest pain, difficulty breathing, dizziness or fainting, or become confused, poorly responsive, or inconsolable (young children)  If you have any other emergent concerns regarding your health  Your vital signs today were: BP 110/76    Pulse 62    Temp 98.9 F (37.2 C) (Oral)    Resp 18    LMP 12/16/2016    SpO2 100%  If your blood pressure (bp) was elevated above 135/85 this visit, please have this repeated by your doctor within one month. --------------

## 2017-01-09 ENCOUNTER — Encounter: Payer: Self-pay | Admitting: Gastroenterology

## 2017-01-10 NOTE — Telephone Encounter (Signed)
Appointment on 02/10/17 with Dr.Danis

## 2017-01-18 ENCOUNTER — Other Ambulatory Visit: Payer: 59

## 2017-01-18 ENCOUNTER — Ambulatory Visit: Payer: 59 | Admitting: Gynecology

## 2017-02-07 ENCOUNTER — Ambulatory Visit (HOSPITAL_COMMUNITY)
Admission: RE | Admit: 2017-02-07 | Payer: No Typology Code available for payment source | Source: Ambulatory Visit | Admitting: Gynecology

## 2017-02-07 ENCOUNTER — Encounter (HOSPITAL_COMMUNITY): Admission: RE | Payer: Self-pay | Source: Ambulatory Visit

## 2017-02-07 SURGERY — HYSTERECTOMY, VAGINAL, LAPAROSCOPY-ASSISTED, WITH SALPINGECTOMY
Anesthesia: General

## 2017-02-10 ENCOUNTER — Encounter (INDEPENDENT_AMBULATORY_CARE_PROVIDER_SITE_OTHER): Payer: Self-pay

## 2017-02-10 ENCOUNTER — Encounter: Payer: Self-pay | Admitting: Gastroenterology

## 2017-02-10 ENCOUNTER — Ambulatory Visit (INDEPENDENT_AMBULATORY_CARE_PROVIDER_SITE_OTHER): Payer: 59 | Admitting: Gastroenterology

## 2017-02-10 VITALS — BP 114/78 | HR 76 | Ht 61.5 in | Wt 148.0 lb

## 2017-02-10 DIAGNOSIS — R634 Abnormal weight loss: Secondary | ICD-10-CM | POA: Diagnosis not present

## 2017-02-10 DIAGNOSIS — K529 Noninfective gastroenteritis and colitis, unspecified: Secondary | ICD-10-CM | POA: Diagnosis not present

## 2017-02-10 DIAGNOSIS — R1084 Generalized abdominal pain: Secondary | ICD-10-CM

## 2017-02-10 MED ORDER — NA SULFATE-K SULFATE-MG SULF 17.5-3.13-1.6 GM/177ML PO SOLN: 1.0000 | Freq: Once | ORAL | 0 refills | Status: AC

## 2017-02-10 NOTE — Patient Instructions (Signed)
If you are age 44 or older, your body mass index should be between 23-30. Your Body mass index is 27.51 kg/m. If this is out of the aforementioned range listed, please consider follow up with your Primary Care Provider.  If you are age 2 or younger, your body mass index should be between 19-25. Your Body mass index is 27.51 kg/m. If this is out of the aformentioned range listed, please consider follow up with your Primary Care Provider.   You have been scheduled for a colonoscopy. Please follow written instructions given to you at your visit today.  Please pick up your prep supplies at the pharmacy within the next 1-3 days. If you use inhalers (even only as needed), please bring them with you on the day of your procedure.  Thank you for choosing [facility]  Dr Wilfrid Lund III

## 2017-02-10 NOTE — Progress Notes (Addendum)
[facility] Consult Note:  History: Julia Green 02/10/2017  Referring physician: Gynecology - Dr. Uvaldo Rising  Reason for consult/chief complaint: Diarrhea (Pt reports diarrhea every day, 4 - 6 times per day.  Pt reports it looks like "rubberbands" on the toilet tissue); Abdominal Pain (Abd cramping since November); and Nausea   Subjective  HPI:  This is a 46 year old woman referred by her gynecologist and seen with the aid of a Spanish interpreter, who was present for the entire encounter. Shontel describes about 9 months of chronic abdominal crampy pain with bloating and gassiness and multiple loose nonbloody bowel movements per day. She describes her stools as like "rubber bands" on the toilet tissue. She does note that the symptoms all started under periods of stress and she has need for urgent BMs very soon after eating. She has been to the ED on at least one occasion for this recently. She was also then seen in family practice clinic. I see from notes that she has been following with gynecology about some symptoms that were felt possibly consistent with endometriosis. She ultimately had plan to have a hysterectomy, but canceled her surgery for earlier this week. She wanted to understand the nature of her digestive symptoms before proceeding with surgery. She does not seem to describe fatty or floating stools. She has no history of alcohol abuse or risk factors for chronic pancreatitis. She's never been admitted to the hospital for pancreatitis that I can determine. She does note about 10-12 pounds of weight loss over the last 9 months with these symptoms. Curiously, she has had no improvement with Bentyl, Imodium or Lomotil. She denies mouth sores, skin rash, or inflamed joints.  ROS:  Review of Systems  Constitutional: Negative for appetite change and unexpected weight change.  HENT: Negative for mouth sores and voice change.   Eyes: Negative for  pain and redness.  Respiratory: Negative for cough and shortness of breath.   Cardiovascular: Negative for chest pain and palpitations.  Genitourinary: Negative for dysuria and hematuria.  Musculoskeletal: Negative for arthralgias and myalgias.  Skin: Negative for pallor and rash.  Neurological: Negative for weakness and headaches.  Hematological: Negative for adenopathy.   Positive for pelvic pain and dyspareunia  Past Medical History: Past Medical History:  Diagnosis Date  . Asthma   . Hypertension   . Thyroid disease      Past Surgical History: History reviewed. No pertinent surgical history.   Family History: Family History  Problem Relation Age of Onset  . Cancer Mother        UTERINE   . Hypertension Mother   . Cancer Sister        unknown    Social History: Social History   Social History  . Marital status: Divorced    Spouse name: N/A  . Number of children: N/A  . Years of education: N/A   Social History Main Topics  . Smoking status: Never Smoker  . Smokeless tobacco: Never Used  . Alcohol use No  . Drug use: No  . Sexual activity: Yes    Birth control/ protection: None   Other Topics Concern  . None   Social History Narrative  . None    Allergies: No Known Allergies  Outpatient Meds: Current Outpatient Prescriptions  Medication Sig Dispense Refill  . aspirin EC 81 MG tablet Take 81 mg by mouth daily.    Marland Kitchen levothyroxine (SYNTHROID, LEVOTHROID) 137 MCG tablet Take 1 tablet (137 mcg total)  by mouth daily before breakfast. 30 tablet 12  . lisinopril-hydrochlorothiazide (PRINZIDE,ZESTORETIC) 10-12.5 MG tablet Take 1 tablet by mouth daily.    . montelukast (SINGULAIR) 10 MG tablet Take 10 mg by mouth at bedtime.    . Multiple Vitamin (MULTI-VITAMIN DAILY PO) Take 1 tablet by mouth daily.    Marland Kitchen omeprazole (PRILOSEC) 40 MG capsule Take 1 capsule (40 mg total) by mouth daily. 30 capsule 0  . vitamin C (ASCORBIC ACID) 500 MG tablet Take 500 mg by  mouth daily.    . Na Sulfate-K Sulfate-Mg Sulf 17.5-3.13-1.6 GM/180ML SOLN Take 1 kit by mouth once. 354 mL 0   No current facility-administered medications for this visit.       ___________________________________________________________________ Objective   Exam:  BP 114/78   Pulse 76   Ht 5' 1.5" (1.562 m)   Wt 148 lb (67.1 kg)   LMP 11/20/2016 (Approximate)   BMI 27.51 kg/m    General: this is a(n) Well-appearing woman who does not appear malnourished  Eyes: sclera anicteric, no redness  ENT: oral mucosa moist without lesions, no cervical or supraclavicular lymphadenopathy, good dentition  CV: RRR without murmur, S1/S2, no JVD, no peripheral edema  Resp: clear to auscultation bilaterally, normal RR and effort noted  GI: soft, mild epigastric tenderness, with active bowel sounds. No guarding or palpable organomegaly noted.  Skin; warm and dry, no rash or jaundice noted  Neuro: awake, alert and oriented x 3. Normal gross motor function and fluent speech  Labs:  TSH nml after dose adjust. On 11/16/2016 her TSH was 10, now it is down to 0.9 a month ago.  No stool studies CMP Latest Ref Rng & Units 01/07/2017 12/23/2012 06/04/2010  Glucose 65 - 99 mg/dL 101(H) 87 96  BUN 6 - 20 mg/dL 8 20 13   Creatinine 0.44 - 1.00 mg/dL 0.78 0.80 0.66  Sodium 135 - 145 mmol/L 135 140 139  Potassium 3.5 - 5.1 mmol/L 3.5 3.1(L) 4.2  Chloride 101 - 111 mmol/L 107 105 103  CO2 22 - 32 mmol/L 21(L) - 27  Calcium 8.9 - 10.3 mg/dL 8.8(L) - 8.9  Total Protein 6.5 - 8.1 g/dL 7.1 - 7.3  Total Bilirubin 0.3 - 1.2 mg/dL 0.5 - 0.3  Alkaline Phos 38 - 126 U/L 58 - 70  AST 15 - 41 U/L 30 - 17  ALT 14 - 54 U/L 39 - 15  Albumin 3.7 CBC Latest Ref Rng & Units 01/07/2017 12/23/2012 12/23/2012  WBC 4.0 - 10.5 K/uL 9.9 - 9.7  Hemoglobin 12.0 - 15.0 g/dL 12.9 13.9 13.8  Hematocrit 36.0 - 46.0 % 37.8 41.0 38.0  Platelets 150 - 400 K/uL 313 - 280   No radiologic studies  Assessment: Encounter  Diagnoses  Name Primary?  . Chronic diarrhea Yes  . Generalized abdominal pain   . Abnormal loss of weight     The nature of symptoms is unclear. Much of it seems consistent with IBS, it is curious that she has not gotten better with the above-noted medicines. It has persisted even after dose adjusting her thyroid med. She has a reported weight loss but with a normal albumin she does not appear malnourished.  Plan:  Stool studies to rule out infection or chronic pancreatitis Colonoscopy. She is agreeable after discussion of the procedure and risks discussed with the Spanish interpreter present.  The benefits and risks of the planned procedure were described in detail with the patient or (when appropriate) their health care proxy.  Risks were outlined as including, but not limited to, bleeding, infection, perforation, adverse medication reaction leading to cardiac or pulmonary decompensation, or pancreatitis (if ERCP).  The limitation of incomplete mucosal visualization was also discussed.  No guarantees or warranties were given.   Thank you for the courtesy of this consult.  Please call me with any questions or concerns.  Nelida Meuse III  CC: Dr. Uvaldo Rising,  Gynecology

## 2017-02-14 ENCOUNTER — Other Ambulatory Visit: Payer: 59

## 2017-02-14 DIAGNOSIS — K529 Noninfective gastroenteritis and colitis, unspecified: Secondary | ICD-10-CM

## 2017-02-14 DIAGNOSIS — E038 Other specified hypothyroidism: Principal | ICD-10-CM

## 2017-02-14 DIAGNOSIS — R634 Abnormal weight loss: Secondary | ICD-10-CM

## 2017-02-14 DIAGNOSIS — E065 Other chronic thyroiditis: Secondary | ICD-10-CM

## 2017-02-14 DIAGNOSIS — R1084 Generalized abdominal pain: Secondary | ICD-10-CM

## 2017-02-15 ENCOUNTER — Other Ambulatory Visit: Payer: 59

## 2017-02-15 ENCOUNTER — Ambulatory Visit: Payer: 59 | Admitting: Gynecology

## 2017-02-15 LAB — OVA AND PARASITE EXAMINATION: OP: NONE SEEN

## 2017-02-15 LAB — C. DIFFICILE GDH AND TOXIN A/B
C. difficile GDH: NOT DETECTED
C. difficile Toxin A/B: NOT DETECTED

## 2017-02-15 LAB — CLOSTRIDIUM DIFFICILE BY PCR: CDIFFPCR: NOT DETECTED

## 2017-02-23 LAB — PANCREATIC ELASTASE, FECAL: Pancreatic Elastase-1, Stool: 81 mcg/g — ABNORMAL LOW

## 2017-02-24 ENCOUNTER — Other Ambulatory Visit: Payer: Self-pay

## 2017-02-24 ENCOUNTER — Telehealth: Payer: Self-pay

## 2017-02-24 MED ORDER — PANCRELIPASE (LIP-PROT-AMYL) 40000-136000 UNITS PO CPEP: 2.0000 | ORAL_CAPSULE | Freq: Three times a day (TID) | ORAL | 1 refills | Status: DC

## 2017-02-24 NOTE — Telephone Encounter (Signed)
-----   Message from Doran Stabler, MD sent at 02/24/2017 11:52 AM EDT ----- Spanish-speaking patient (need office staff to help with results call).  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  That is probably the cause of diarrhea and weight loss. I do not yet know why the pancreas has that problem.  New plan:  Cancel colonoscopy for August.  Start pancreatic enzyme capsules:  Zenpep 40,000 unit capsules.  Sig: 2 capsules with meals, 1 capsule with snacks.  Disp #240 capsules, RF 1 Please arrange clinic appointment with me in 4-5 weeks.  Let me know if there is a denial/cost issue with Zenpep.  If so, please find out what her insurance says is a preferred brand of pancreatic enzyme.  - HD

## 2017-02-24 NOTE — Telephone Encounter (Signed)
Patient is advised and instructed on her results. Zenpep Rx sent to the pharmacy. Waiting to see if it is covered. Patient had already purchased her Suprep. It cost over $100. It cannot be returned.

## 2017-02-27 ENCOUNTER — Other Ambulatory Visit: Payer: Self-pay

## 2017-02-27 ENCOUNTER — Telehealth: Payer: Self-pay | Admitting: Gastroenterology

## 2017-02-27 ENCOUNTER — Telehealth: Payer: Self-pay | Admitting: *Deleted

## 2017-02-27 MED ORDER — PANCRELIPASE (LIP-PROT-AMYL) 36000-114000 UNITS PO CPEP: ORAL_CAPSULE | ORAL | 3 refills | Status: DC

## 2017-02-27 MED ORDER — TRANEXAMIC ACID 650 MG PO TABS
ORAL_TABLET | ORAL | 0 refills | Status: DC
Start: 2017-02-27 — End: 2017-06-27

## 2017-02-27 NOTE — Telephone Encounter (Signed)
She was supposed to follow-up with an ultrasound?: Lysteda 650 mg 2 tablets 3 times a day for 5 days to help with her bleeding. Schedule ultrasound

## 2017-02-27 NOTE — Telephone Encounter (Signed)
ton

## 2017-02-27 NOTE — Telephone Encounter (Signed)
Will have [place] relay to her, Rx has been sent.

## 2017-02-27 NOTE — Telephone Encounter (Signed)
Julia Green informed pt, pt said she can't afford to pay for ultrasound at this time, Julia Green will relay to [place].

## 2017-02-27 NOTE — Telephone Encounter (Signed)
Have you received a PA for Zenpep from [place]?

## 2017-02-27 NOTE — Telephone Encounter (Signed)
I called [facility]. Per [facility] the Rx was too expensive ($3,100) A month. Pharmacist said that there was not a request for a prior auth as it is just not covered with her insurance. Do you want to try a RX for Creon? Please advise.

## 2017-02-27 NOTE — Telephone Encounter (Signed)
Pt received Depo provera injection on 01/03/17, has been bleeding x 2 weeks now, light bleeding at first has now increased to heavier flow per patient daughter. Pt would like to have directions on what to do regarding bleeding. Please advise

## 2017-02-27 NOTE — Telephone Encounter (Signed)
Yes, it would be for Creon 36,000 strength, also given as 2 capsules with each meal and one with snack.  Disp #240, RF 3  If her insurance has a problem with that, we need to find out from them what they say is a preferred formulary choice for pancreatic enzyme so we do not keep guessing.  - HD

## 2017-02-27 NOTE — Telephone Encounter (Signed)
Refilled as directed by Dr Danis  

## 2017-02-28 ENCOUNTER — Other Ambulatory Visit: Payer: Self-pay

## 2017-02-28 MED ORDER — PANCRELIPASE (LIP-PROT-AMYL) 36000-114000 UNITS PO CPEP: ORAL_CAPSULE | ORAL | 3 refills | Status: DC

## 2017-02-28 NOTE — Telephone Encounter (Signed)
Rx has been cancelled with [facility]. New Creon RX has been sent [facility] to see if they can offer any help. Loyal Buba from the front office will call pt as she speaks only spanish and give her an update on our current progress.

## 2017-02-28 NOTE — Telephone Encounter (Signed)
[facility] pharmacy called to state no prior auth was needed at this time so they will reach out to patient for medication.

## 2017-03-01 NOTE — Telephone Encounter (Signed)
Called and spoke to [facility]. Rx DOES need a prior auth and they asked for our records to be faxed. I have faxed office visit and last labs

## 2017-03-13 NOTE — Telephone Encounter (Signed)
Prior Julia Green has been approved for creon # [id number]- [id number] 03-09-2017 to 03-09-2018. With a voucher from [facility] pts co pay will be $5. [facility] will call and inform the patient.

## 2017-03-17 ENCOUNTER — Encounter: Payer: Self-pay | Admitting: Gastroenterology

## 2017-03-17 NOTE — Telephone Encounter (Signed)
Error

## 2017-03-22 ENCOUNTER — Ambulatory Visit (INDEPENDENT_AMBULATORY_CARE_PROVIDER_SITE_OTHER): Payer: 59

## 2017-03-22 ENCOUNTER — Ambulatory Visit (INDEPENDENT_AMBULATORY_CARE_PROVIDER_SITE_OTHER): Payer: 59 | Admitting: Gynecology

## 2017-03-22 ENCOUNTER — Encounter: Payer: Self-pay | Admitting: Gynecology

## 2017-03-22 DIAGNOSIS — R197 Diarrhea, unspecified: Secondary | ICD-10-CM

## 2017-03-22 DIAGNOSIS — N941 Unspecified dyspareunia: Secondary | ICD-10-CM | POA: Diagnosis not present

## 2017-03-22 DIAGNOSIS — R140 Abdominal distension (gaseous): Secondary | ICD-10-CM

## 2017-03-22 DIAGNOSIS — R102 Pelvic and perineal pain: Secondary | ICD-10-CM | POA: Diagnosis not present

## 2017-03-22 NOTE — Progress Notes (Signed)
   Patient is a 46 year old gravida 4 para 4 who presented to the office today for follow-up ultrasound as a result of bloating sensation and pelvic pain. Her history is as follows:  Patient was seen the office on March 28 complaining of 3 months history of low abdominal discomfort dyspareunia and bloating. She had been on continuous oral contraceptive pill and withdrawal every 3 months to see her symptoms improved in the event she may have had underlying endometriosis.She had an ultrasound in the office on 07/20/2016 which demonstrated the following:  Uterus measures 7.8 x 5.8 x 4.2 cm with endometrial stripe of 8.8 mm. Right and left ovary were normal several follicles were noted in both ovaries otherwise normal no fluid in the cul-de-sac.  As part of assessment and plan we discussed the following "Symptoms highly suspicious for possible endometriosis we discussed treatment options to include Depo-Provera injection every 3 months versus Lupron every 3 months 2 versus continuous low dose oral contraceptive pill and symptoms do not improve then proceed with laparoscopic evaluation. She has decided to proceed with a low dose 20 g oral contraceptive pill. She will be prescribed Aiane 20 g oral contraceptive pill to take continuously and withdrawal every 3 months. If this helps with her symptoms we will see her next year for annual exam or sooner if symptoms are not improved."  Patient stated she has not noticed any symptoms improvement but actually worse. Her PCP is treating her with lisinopril for her hypertension as well as being treated for hypothyroidism with Synthroid. Patient been complaining of hot flashes and sweating so a TSH was found to be elevated and since her PCP had her on Synthroid 125 g daily we adjusted to 137 g daily and is here to check her TSH level as well.  We had initially plan on scheduling a laparoscopic-assisted vaginal hysterectomy with possible bilateral  salpingo-oophorectomy in the event of endometriosis the patient decided today that she would like to cancel and look at other alternatives. She will return back to the office next week for an ultrasound to make sure that her ovaries continue to be normal. She received Depo-Provera 150 mg IM on 01/03/2017. She reports no improvement her symptoms low abdominal discomfort sometimes radiating to her back and she had also been referred to the gastroenterologist but has not seen one yet.  Ultrasound today: Uterus measures 7.4 x 6.5 x 3.9 cm with endometrial stripe of 6.5 mm right ovary normal. Left ovary thinwall echo-free follicle 26 x 27 x 20 mm no fluid in the cul-de-sac.  Assessment/plan: Patient with chronic pelvic pain, dyspareunia and bloating we had discussed potential she could have underlying endometriosis. Patient now has decided she would like to proceed with the recommended laparoscopic-assisted vaginal hysterectomy with bilateral salpingectomy and possible oophorectomy. Since I return again of the week and going to refer her to my colleague to evaluate her and schedule her surgery accordingly. I have provided her literature information Spanish and endometriosis as well as on LAVH.  Greater than 90% of time was spent counseling and coordinating care for this patient. Time of consultation 15 minutes

## 2017-03-22 NOTE — Patient Instructions (Signed)
Histerectoma vaginal asistida por laparoscopa (Laparoscopically Assisted Vaginal Hysterectomy) La histerectoma vaginal asistida por laparoscopa (HVAL) es un procedimiento quirrgico para extirpar el tero y el cuello uterino, y en algunos casos los ovarios y las trompas de [place]. Durante la HVAL, cierta parte de la remocin [place] se realiza a travs de la vagina, y el resto a travs de pequeos cortes quirrgicos (incisiones) en el abdomen. Este procedimiento generalmente se indica en mujeres en las que la histerectoma vaginal no es una opcin. El mdico Delphi riesgos y beneficios de los diferentes tcnicas quirrgicas cuando concurra a la visita. Generalmente el tiempo de recuperacin es rpido y hay menos complicaciones luego de los procedimientos laparoscpicos que en los procedimientos de [place]. INFORME A SU MDICO:  Cualquier alergia que tenga.  Todos los [employer] [place], incluyendo vitaminas, hierbas, gotas oftlmicas, cremas y medicamentos de venta libre.  Problemas previos que usted o los [employer] de su familia hayan tenido con el uso de anestsicos.  Enfermedades de [employer].  Cirugas previas.  Padecimientos mdicos. RIESGOS Y COMPLICACIONES Generalmente es un procedimiento seguro. Sin embargo, Games developer procedimiento, pueden surgir complicaciones. Las complicaciones posibles son:  Risk analyst a los medicamentos.  Dificultad para respirar.  Hemorragias.  Infeccin.  Dao a otras estructuras cercanas al tero y al cuello. ANTES DEL PROCEDIMIENTO  Consulte a su mdico si debe cambiar o suspender los medicamentos que toma habitualmente.  [employer], como los que necesita para prepararse vaciando el colon, segn las indicaciones.  No coma ni beba nada durante al menos 8 horas antes del procedimiento.  Si fuma, abandone el hbito. Si deja de fumar mejorar su salud despus de la [place].  [place] a alguien que  la lleve a su casa despus de la ciruga y la ayude en casa mientras se recupera. PROCEDIMIENTO  Le colocarn una va intravenosa (IV) en una vena para administrarle lquidos y medicamentos.  Recibir medicamentos para relajarse y otros que la harn dormir (anestesia general).  Le colocarn un tubo flexible (catter) en la vejiga para drenar la orina.  Tambin insertarn un tubo a travs de la nariz o la boca hacia el estmago (sonda nasogstrica). La sonda nasogstrica drena los jugos digestivos e impide que sienta nuseas o tenga vmitos.  Le colocarn medias ajustadas (compresin) en las piernas para Product manager.  Le practicarn tres o cuatro incisiones pequeas en el abdomen. Tambin le harn una incisin en la vagina. Le insertarn unas sondas e instrumentos a travs de las pequeas incisiones. Extirparn el tero y el cuello uterino (y posiblemente los ovarios y las trompas de [place]) a travs de la vagina, as como a travs de las pequeas incisiones que se hicieron en el abdomen.  Luego la vagina se sutura a su estado normal. DESPUS DEL PROCEDIMIENTO  Posiblemente deba seguir una dieta lquida por un tiempo. Lo ms probable es volver a la dieta habitual y Engineer, structural bien al da siguiente de la [place].  Tendr que orinar a travs de un catter. Este se Teacher, English as a foreign language despus de la [place].  Le harn controles regulares de la temperatura, frecuencia cardaca y respiratoria, presin arterial y nivel de oxgeno.  Seguir [employer] Corporation de compresin en las piernas hasta que pueda moverse.  Usar un dispositivo especial o har ejercicios de respiracin para mantener los pulmones limpios.  La alentarn a caminar lo ms pronto posible. Esta informacin no tiene Marine scientist el consejo del mdico. Asegrese de hacerle al mdico cualquier  pregunta que tenga. Document Released: 07/28/2011 Document Revised: 08/29/2014 Document Reviewed: 02/21/2013 Elsevier  Interactive Patient Education  2018 [employer]. Endometriosis (Endometriosis) La endometriosis es una enfermedad en la que el tejido que rodea al tero (endometrio) crece fuera de su ubicacin normal. El tejido puede crecer en muchos lugares cerca del tero, pero comnmente crece en los ovarios, las trompas de [place], la vagina o el intestino. Cuando el tero desprende el endometrio en cada ciclo menstrual, hay sangrado en el lugar donde se localiza el tejido endometrial. Esto puede causar dolor porque la sangre es irritante para los tejidos que no estn normalmente expuestos a Librarian, academic. CAUSAS Se desconoce la causa de la endometriosis. FACTORES DE RIESGO Puede tener ms probabilidades de tener endometriosis si:  Tiene antecedentes familiares de endometriosis.  No ha tenido hijos.  Inici su perodo menstrual a los 10aos de edad o menos.  Tiene un alto nivel de estrgeno en el cuerpo.  Estuvo expuesta a cierto medicamento (dietiletilbestrol) antes de Associate Professor (dentro del tero).  Tuvo bajo peso al [employer].  Tiene uno, dos o mltiples hermanos gemelos.  Tiene un [place] inferior a25. El [place] (ndice de masa corporal) es la estimacin de la grasa corporal y se calcula a partir de la altura y [place]. SNTOMAS A menudo, esta afeccin no presenta sntomas. Si tiene sntomas, estos pueden:  Variar segn dnde est creciendo el tejido endometrial.  Ocurrir durante el perodo menstrual (lo ms frecuente) o en la mitad del ciclo.  Ir y venir, o pueden pasar meses sin ningn sntoma.  Desaparecer con la menopausia. Entre los sntomas se pueden incluir los siguientes:  Dolor en la espalda o el abdomen.  Sangrado ms abundante durante los perodos [employer]ogg.  [place].  Dolor al defecar.  Infertilidad.  Dolor en la pelvis.  Tener ms de una hemorragia al [employer]. DIAGNSTICO Esta afeccin se diagnostica en funcin de los sntomas y de un examen fsico.  Pueden hacerle estudios, por ejemplo:  Anlisis de [place] y [place]. Estos estudios pueden servir para descartar otras causas posibles de sus sntomas.  Ecografa, para buscar tejidos anormales.  Radiografa del recto (enema de bario).  Una ecografa que se realiza a travs de la vagina (transvaginal).  Tomografa computarizada (TC).  Resonancia magntica (RM).  Una laparoscopia. En este procedimiento, se introduce en el abdomen un instrumento del tamao de un lpiz que tiene una luz, llamado laparoscopio, a travs de una incisin. El laparoscopio le permite al mdico observar los rganos internos y Hydrographic surveyor tejidos anormales para confirmar el diagnstico. Si se descubre la presencia de tejidos anormales, el mdico puede extraer una pequea cantidad de tejido (biopsia) para examinarlo con un microscopio. TRATAMIENTO El tratamiento de esta afeccin puede incluir lo siguiente:  Medicamentos para Best boy, por ejemplo, antiinflamatorios no esteroides (AINE).  Terapia hormonal. Esto significa utilizar hormonas artificiales (sintticas) para reducir Arboriculturist del tejido endometrial. El mdico puede recomendarle usar un mtodo anticonceptivo hormonal u otros medicamentos.  Someterse a [place]. Se puede realizar una ciruga para extraer el tejido endometrial anormal. ? En algunos casos, el tejido se puede extraer utilizando un laparoscopio y Marine scientist (tratamiento laparoscpico con Animal nutritionist). ? [employer] son graves, puede realizarse una ciruga para extirpar las trompas de [place], el tero y los ovarios (histerectoma). [place] los medicamentos de venta libre y los recetados solamente como se lo haya indicado el mdico.  No conduzca ni use [place]  pesada mientras toma analgsicos recetados.  Trate de evitar actividades que produzcan dolor, incluida la actividad sexual.  Dallie Dad a todas las visitas de control como se lo haya  indicado el mdico. Esto es importante. SOLICITE ATENCIN MDICA SI:  Siente dolor en la zona Le[employer] caderas (regin plvica), que ocurre: ? Antes, durante o despus del perodo menstrual. ? En medio del perodo y [place] perodo. ? Durante o despus [place]. ? Al defecar u orinar, especialmente durante el perodo menstrual.  Tiene dificultad para quedar embarazada.  Tiene fiebre. SOLICITE ATENCIN MDICA DE INMEDIATO SI:  Tiene un dolor intenso que no mejora con medicamentos.  Tiene nuseas y vmitos intensos, o no puede comer sin vomitar.  Siente un dolor que afecta solo la parte inferior derecha del abdomen.  Tiene un dolor abdominal que empeora.  Tiene hinchazn abdominal.  Observa sangre en la materia fecal. Esta informacin no tiene como fin reemplazar el consejo del mdico. Asegrese de hacerle al mdico cualquier pregunta que tenga. Document Released: 08/08/2005 Document Revised: 11/30/2015 Document Reviewed: 01/09/2016 Elsevier Interactive Patient Education  [employer].

## 2017-03-23 NOTE — Telephone Encounter (Signed)
03-23-17 [facility] has been trying to reach patient. I called and spoke to [facility], he says she is at work until [employer] and is unable to answer the phone during work hours. Pharmacy needs to speak to her before shipping out her medication for a $5 copay. He will have patient call us back later.

## 2017-03-24 ENCOUNTER — Encounter: Payer: 59 | Admitting: Gastroenterology

## 2017-04-04 ENCOUNTER — Encounter: Payer: Self-pay | Admitting: Obstetrics & Gynecology

## 2017-04-04 ENCOUNTER — Ambulatory Visit (INDEPENDENT_AMBULATORY_CARE_PROVIDER_SITE_OTHER): Payer: 59 | Admitting: Obstetrics & Gynecology

## 2017-04-04 VITALS — BP 132/84

## 2017-04-04 DIAGNOSIS — M79604 Pain in right leg: Secondary | ICD-10-CM

## 2017-04-04 DIAGNOSIS — M545 Low back pain: Secondary | ICD-10-CM | POA: Diagnosis not present

## 2017-04-04 DIAGNOSIS — R102 Pelvic and perineal pain: Secondary | ICD-10-CM

## 2017-04-04 DIAGNOSIS — N921 Excessive and frequent menstruation with irregular cycle: Secondary | ICD-10-CM

## 2017-04-04 MED ORDER — MEDROXYPROGESTERONE ACETATE 150 MG/ML IM SUSP
150.0000 mg | Freq: Once | INTRAMUSCULAR | Status: AC
  Administered 2017-04-04: 150 mg via INTRAMUSCULAR

## 2017-04-04 NOTE — Progress Notes (Signed)
Julia Green Apr 04, 1971 [id number]   History:    46 y.o.  G4P4 Married.  S/P BT/S.  Interpreter present.  RP:  Irregular menses with cramping post DepoProvera x 1 in 12/2016  HPI:  Patient transferred to my care by Dr Toney Rakes who recently retired.  Dr Toney Rakes previously counceled patient that her symptoms could be due to Endometriosis.  The possibility of performing a LAVH with BSO if Endometriosis is confirmed was discussed.   Started on continuous Aviane 1/20 in 10/2016 for lower abdominal discomfort and Dyspareunia which started around 08/2016.  Came back 5/15th stating that she was not better, slightly worse.  Given DepoProvera injection 5/15th.  Since then, has had irregular bleeding with cramping.  Pelvic US normal 03/22/2017.    Past medical history,surgical history, family history and social history were all reviewed and documented in the EPIC chart.  Gynecologic History Patient's last menstrual period was 03/24/2017. Contraception: Depo-Provera injections x 1 12/2016 Last Pap: Normal  Obstetric History OB History  Gravida Para Term Preterm AB Living  4 4       4   SAB TAB Ectopic Multiple Live Births               # Outcome Date GA Lbr Len/2nd Weight Sex Delivery Anes PTL Lv  4 Para           3 Para           2 Para           1 Para                ROS: A ROS was performed and pertinent positives and negatives are included in the history.  GENERAL: No fevers or chills. HEENT: No change in vision, no earache, sore throat or sinus congestion. NECK: No pain or stiffness. CARDIOVASCULAR: No chest pain or pressure. No palpitations. PULMONARY: No shortness of breath, cough or wheeze. GASTROINTESTINAL: No abdominal pain, nausea, vomiting or diarrhea, melena or bright red blood per rectum. GENITOURINARY: No urinary frequency, urgency, hesitancy or dysuria. MUSCULOSKELETAL: No joint or muscle pain, no back pain, no recent trauma. DERMATOLOGIC: No rash, no itching, no  lesions. ENDOCRINE: No polyuria, polydipsia, no heat or cold intolerance. No recent change in weight. HEMATOLOGICAL: No anemia or easy bruising or bleeding. NEUROLOGIC: No headache, seizures, numbness, tingling or weakness. PSYCHIATRIC: No depression, no loss of interest in normal activity or change in sleep pattern.     Exam:   BP 132/84   LMP 03/24/2017   There is no height or weight on file to calculate BMI.  General appearance : Well developed well nourished female. No acute distress  Abdomen: no palpable masses or tenderness  Extremities: no edema or skin discoloration or tenderness  Pelvic: Vulva: Normal             Vagina: Normal  Cervix: Normal.  Very mild dark blood with secretions  Uterus  AV, normal size, shape and consistency, non-tender and mobile  Adnexa  Without masses or tenderness  Pelvic US 03/22/2017: Uterus measures 7.4 x 6.5 x 3.9 cm with endometrial stripe of 6.5 mm right ovary normal. Left ovary thin-wall echo-free follicle 26 x 27 x 20 mm no fluid in the cul-de-sac.   Ref. Range 01/07/2017 11:53  WBC Latest Ref Range: 4.0 - 10.5 K/uL 9.9  RBC Latest Ref Range: 3.87 - 5.11 MIL/uL 4.29  Hemoglobin Latest Ref Range: 12.0 - 15.0 g/dL 12.9  HCT Latest Ref Range:  36.0 - 46.0 % 37.8  MCV Latest Ref Range: 78.0 - 100.0 fL 88.1  MCH Latest Ref Range: 26.0 - 34.0 pg 30.1  MCHC Latest Ref Range: 30.0 - 36.0 g/dL 34.1  RDW Latest Ref Range: 11.5 - 15.5 % 13.5  Platelets Latest Ref Range: 150 - 400 K/uL 313    Assessment/Plan:  46 y.o. female for annual exam   1. Female pelvic pain Patient's symptoms and findings including a normal Gyn exam, normal Pelvic US and normal Hb discussed.  The fact that her current irregular bleeding and cramping are probably the consequence of having received DepoProvera x 1 dose reviewed.  Recommended to try a second dose as we are exactly at 3 months of the first one.  Patient agreed to attempt DepoProvera treatment further.  Given that  she never had a Dx of Endometriosis made, even at the time of her LPS BT/S after her 4th child, decision made to proceed with a Dx LPS, possible conservative treatment of Endometriosis rather than directly going for a LAVH with BSO if Endometriosis.  Surgery discussed.  Scheduling.  F/U Preop visit.  - medroxyPROGESTERone (DEPO-PROVERA) injection 150 mg; Inject 1 mL (150 mg total) into the muscle once.  2. Menometrorrhagia Probably secondary to DepoProvera x 1 dose.  3. 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  Refer to Orthopedist.  Counseling on above issues >50% x 25 minutes.  Princess Bruins MD, 1:25 PM 04/05/2017

## 2017-04-05 ENCOUNTER — Telehealth: Payer: Self-pay | Admitting: *Deleted

## 2017-04-05 ENCOUNTER — Encounter: Payer: Self-pay | Admitting: Gastroenterology

## 2017-04-05 ENCOUNTER — Ambulatory Visit (INDEPENDENT_AMBULATORY_CARE_PROVIDER_SITE_OTHER): Payer: 59 | Admitting: Gastroenterology

## 2017-04-05 VITALS — BP 108/78 | HR 76 | Ht 62.0 in | Wt 155.8 lb

## 2017-04-05 DIAGNOSIS — K529 Noninfective gastroenteritis and colitis, unspecified: Secondary | ICD-10-CM

## 2017-04-05 DIAGNOSIS — R634 Abnormal weight loss: Secondary | ICD-10-CM

## 2017-04-05 DIAGNOSIS — M5440 Lumbago with sciatica, unspecified side: Secondary | ICD-10-CM

## 2017-04-05 MED ORDER — OMEPRAZOLE 40 MG PO CPDR: 40.0000 mg | DELAYED_RELEASE_CAPSULE | Freq: Every day | ORAL | 2 refills | Status: DC

## 2017-04-05 NOTE — Telephone Encounter (Signed)
-----   Message from Princess Bruins, MD sent at 04/04/2017  4:46 PM EDT ----- Regarding: Refer to Orthopedist Lower back pain with pain and numbing, pins and needles towards Rt leg.  Bulging disc?

## 2017-04-05 NOTE — Telephone Encounter (Signed)
Patient was given appointment information. Dr.XU on 04/17/17 @ 2:45pm at [place] address and telephone number was provided for the patient.

## 2017-04-05 NOTE — Telephone Encounter (Signed)
Pt scheduled with Dr.XU( chew) on 04/17/17 @ 2:45pm at [facility] orthopedic [address] [place], [place] [address]   Will route to [place] to relay to patient.

## 2017-04-05 NOTE — Progress Notes (Signed)
     [facility] Progress Note  Chief Complaint: Chronic diarrhea and weight loss  Subjective  History:  Julia Green was seen with a Spanish interpreter today.  She continues to have intermittent crampy lower abdominal pain and 8-10 semi- formed to loose BMs per day. There is no nocturnal diarrhea, rectal bleeding, or ongoing weight loss. In fact she has put on about 6 pounds since the visit in June. Initial testing revealed a severely low fecal elastase at 81, so I wanted her to start pancreatic enzymes. We had considerable difficulty reaching her and finding an affordable formulary choice. Just last week she started Creon 36, 2 tablets with each meal and one with each snack. There has been mild improvement in the diarrhea with that. There is no rash, arthralgias or painful eye redness.  ROS: Cardiovascular:  no chest pain Respiratory: no dyspnea Remainder systems negative except as above  The patient's Past Medical, Family and Social History were reviewed and are on file in the EMR.  Objective:  Med list reviewed  Vital signs in last 24 hrs: Vitals:   04/05/17 1604  BP: 108/78  Pulse: 76    Physical Exam    HEENT: sclera anicteric, oral mucosa moist without lesions  Neck: supple, no thyromegaly, JVD or lymphadenopathy  Cardiac: RRR without murmurs, S1S2 heard, no peripheral edema  Pulm: clear to auscultation bilaterally, normal RR and effort noted  Abdomen: soft, No tenderness, with active bowel sounds. No guarding or palpable hepatosplenomegaly.  Skin; warm and dry, no jaundice or rash  Recent Labs:  Stool negative for C diff or O/P  Fecal elastase low at 81  @ASSESSMENTPLANBEGIN @ Assessment: Encounter Diagnoses  Name Primary?  . Chronic diarrhea Yes  . Abnormal loss of weight   I'm still puzzled by her clinical presentation.  She does not appear to have risk factors for chronic pancreatitis, and a week of sufficient enzyme supplements should  significantly improve the diarrhea of pancreatic insufficiency.  Noted in initial consult, it is curious that she reported no improvement in the diarrhea with Bentyl, Imodium or Lomotil. Curiously, she shows me some medicine she got from her country of origin, and it is generic loperamide, which she says seems to improve the diarrhea.  Since she does not seem to have risk factors for pancreatic insufficiency, we must consider small bowel sources such as celiac sprue or bacterial overgrowth. Overt and microscopic colitis should be considered as well, though those should not affect the fecal elastase test.   Plan: EGD and colonoscopy next week. Biopsies of the small bowel and colon.  The benefits and risks of the planned procedure were described in detail with the patient or (when appropriate) their health care proxy.  Risks were outlined as including, but not limited to, bleeding, infection, perforation, adverse medication reaction leading to cardiac or pulmonary decompensation, or pancreatitis (if ERCP).  The limitation of incomplete mucosal visualization was also discussed.  No guarantees or warranties were given.  She will continue the pancreatic enzyme supplements in the meantime.  If the above testing is negative, then we will arrange CT abdomen to image the pancreas and make sure there is no apparent structural or neoplastic causes to cause pancreatic insufficiency.  Not withstanding the initial weight loss which has now resolved, I still suspect there may be some functional component to this.  Total time 25 minutes, over half spent in counseling and coordination of care.   Nelida Meuse III

## 2017-04-05 NOTE — Patient Instructions (Addendum)
You have been scheduled for an endoscopy and colonoscopy. Please follow the written instructions given to you at your visit today. Please pick up your prep supplies at the pharmacy within the next 1-3 days. If you use inhalers (even only as needed), please bring them with you on the day of your procedure. Your physician has requested that you go to www.startemmi.com and enter the access code given to you at your visit today. This web site gives a general overview about your procedure. However, you should still follow specific instructions given to you by our office regarding your preparation for the procedure.  We have sent the following medications to your pharmacy for you to pick up at your convenience: Omeprazole 40 mg   If you are age 35 or older, your body mass index should be between 23-30. Your Body mass index is 28.5 kg/m. If this is out of the aforementioned range listed, please consider follow up with your Primary Care Provider.  If you are age 49 or younger, your body mass index should be between 19-25. Your Body mass index is 28.5 kg/m. If this is out of the aformentioned range listed, please consider follow up with your Primary Care Provider.     Thank you for choosing [facility]  Dr Wilfrid Lund III

## 2017-04-05 NOTE — Patient Instructions (Signed)
1. Female pelvic pain Patient's symptoms and findings including a normal Gyn exam, normal Pelvic US and normal Hb discussed.  The fact that her current irregular bleeding and cramping are probably the consequence of having received DepoProvera x 1 dose reviewed.  Recommended to try a second dose as we are exactly at 3 months of the first one.  Patient agreed to attempt DepoProvera treatment further.  Given that she never had a Dx of Endometriosis made, even at the time of her LPS BT/S after her 4th child, decision made to proceed with a Dx LPS, possible conservative treatment of Endometriosis rather than directly going for a LAVH with BSO if Endometriosis.  Surgery discussed.  Scheduling.  F/U Preop visit.  - medroxyPROGESTERone (DEPO-PROVERA) injection 150 mg; Inject 1 mL (150 mg total) into the muscle once.  2. Menometrorrhagia Probably secondary to DepoProvera x 1 dose.  3. 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  Refer to [facility], it was a pleasure to meet you today!  I will see you again soon for the preop visit.

## 2017-04-07 ENCOUNTER — Encounter: Payer: Self-pay | Admitting: Anesthesiology

## 2017-04-12 ENCOUNTER — Encounter: Payer: Self-pay | Admitting: Gastroenterology

## 2017-04-12 ENCOUNTER — Ambulatory Visit (AMBULATORY_SURGERY_CENTER): Payer: 59 | Admitting: Gastroenterology

## 2017-04-12 VITALS — BP 131/86 | HR 68 | Temp 98.9°F | Resp 13 | Ht 62.0 in | Wt 155.0 lb

## 2017-04-12 DIAGNOSIS — D122 Benign neoplasm of ascending colon: Secondary | ICD-10-CM | POA: Diagnosis not present

## 2017-04-12 DIAGNOSIS — R634 Abnormal weight loss: Secondary | ICD-10-CM | POA: Diagnosis not present

## 2017-04-12 DIAGNOSIS — K529 Noninfective gastroenteritis and colitis, unspecified: Secondary | ICD-10-CM

## 2017-04-12 MED ORDER — SODIUM CHLORIDE 0.9 % IV SOLN: 500.0000 mL | INTRAVENOUS | Status: DC

## 2017-04-12 NOTE — Progress Notes (Signed)
A and O x3. Report to RN. Tolerated MAC anesthesia well.Teeth unchanged after procedure.

## 2017-04-12 NOTE — Op Note (Signed)
[facility] Patient Name: Julia Green Procedure Date: 04/12/2017 2:41 PM MRN: [id number] Endoscopist: Mallie Mussel L. Loletha Carrow , MD Age: 46 Referring MD:  Date of Birth: 25-Feb-1971 Gender: Female Account #: [id number] Procedure:                Upper GI endoscopy Indications:              Diarrhea (? small bowel source), Weight loss Medicines:                Monitored Anesthesia Care Procedure:                Pre-Anesthesia Assessment:                           - Prior to the procedure, a History and Physical                            was performed, and patient medications and                            allergies were reviewed. The patient's tolerance of                            previous anesthesia was also reviewed. The risks                            and benefits of the procedure and the sedation                            options and risks were discussed with the patient.                            All questions were answered, and informed consent                            was obtained. Prior Anticoagulants: The patient has                            taken no previous anticoagulant or antiplatelet                            agents. ASA Grade Assessment: II - A patient with                            mild systemic disease. After reviewing the risks                            and benefits, the patient was deemed in                            satisfactory condition to undergo the procedure.                           After obtaining informed consent, the endoscope was  passed under direct vision. Throughout the                            procedure, the patient's blood pressure, pulse, and                            oxygen saturations were monitored continuously. The                            Model GIF-HQ190 [id number] scope was introduced                            through the mouth, and advanced to the second part   of duodenum. The upper GI endoscopy was                            accomplished without difficulty. The patient                            tolerated the procedure well. Scope In: Scope Out: Findings:                 The larynx was normal.                           The esophagus was normal.                           A small amount of food (residue) was found in the                            gastric body.                           The cardia and gastric fundus were normal on                            retroflexion.                           The examined duodenum was normal. Six biopsies were                            taken with a cold forceps for histology from the                            second portion and sweep. Complications:            No immediate complications. Estimated Blood Loss:     Estimated blood loss was minimal. Impression:               - Normal larynx.                           - Normal esophagus.                           - A small  amount of food (residue) in the stomach.                           - Normal examined duodenum. Biopsied. Recommendation:           - Patient has a contact number available for                            emergencies. The signs and symptoms of potential                            delayed complications were discussed with the                            patient. Return to normal activities tomorrow.                            Written discharge instructions were provided to the                            patient.                           - Resume previous diet.                           - Continue present medications.                           - Await pathology results.                           - See the other procedure note for documentation of                            additional recommendations.  L. Loletha Carrow, MD 04/12/2017 3:17:03 PM This report has been signed electronically.

## 2017-04-12 NOTE — Progress Notes (Signed)
Called to room to assist during endoscopic procedure.  Patient ID and intended procedure confirmed with present staff. Received instructions for my participation in the procedure from the performing physician.  

## 2017-04-12 NOTE — Progress Notes (Signed)
Pt's states no medical or surgical changes since previsit or office visit. 

## 2017-04-12 NOTE — Op Note (Signed)
[facility] Patient Name: Julia Green Procedure Date: 04/12/2017 2:40 PM MRN: [id number] Endoscopist: Mallie Mussel L. Loletha Carrow , MD Age: 46 Referring MD:  Date of Birth: 14-Jan-1971 Gender: Female Account #: [id number] Procedure:                Colonoscopy Indications:              Chronic diarrhea, Weight loss Medicines:                Monitored Anesthesia Care Procedure:                Pre-Anesthesia Assessment:                           - Prior to the procedure, a History and Physical                            was performed, and patient medications and                            allergies were reviewed. The patient's tolerance of                            previous anesthesia was also reviewed. The risks                            and benefits of the procedure and the sedation                            options and risks were discussed with the patient.                            All questions were answered, and informed consent                            was obtained. Prior Anticoagulants: The patient has                            taken no previous anticoagulant or antiplatelet                            agents. ASA Grade Assessment: II - A patient with                            mild systemic disease. After reviewing the risks                            and benefits, the patient was deemed in                            satisfactory condition to undergo the procedure.                           After obtaining informed consent, the colonoscope  was passed under direct vision. Throughout the                            procedure, the patient's blood pressure, pulse, and                            oxygen saturations were monitored continuously. The                            Model CF-HQ190L [id number]) scope was introduced                            through the anus and advanced to the the terminal                            ileum. The  colonoscopy was performed without                            difficulty. The patient tolerated the procedure                            well. The quality of the bowel preparation was                            good. The terminal ileum, ileocecal valve,                            appendiceal orifice, and rectum were photographed.                            The quality of the bowel preparation was evaluated                            using the BBPS [facility] Bowel Preparation Scale)                            with scores of: Right Colon = 2, Transverse Colon =                            2 and Left Colon = 2. The total BBPS score equals                            6. The bowel preparation used was SUPREP. Scope In: 2:54:50 PM Scope Out: 3:09:51 PM Scope Withdrawal Time: 0 hours 10 minutes 51 seconds  Total Procedure Duration: 0 hours 15 minutes 1 second  Findings:                 The perianal and digital rectal examinations were                            normal.                           The terminal ileum appeared normal (  intubated to                            10cm depth).                           A 2 mm polyp was found in the distal ascending                            colon. The polyp was sessile. The polyp was removed                            with a cold biopsy forceps. Resection and retrieval                            were complete.                           Normal mucosa was found in the entire colon.                            Biopsies for histology were taken with a cold                            forceps from the right colon and left colon for                            evaluation of microscopic colitis.                           The exam was otherwise without abnormality on                            direct and retroflexion views. Complications:            No immediate complications. Estimated Blood Loss:     Estimated blood loss was minimal. Impression:               - The  examined portion of the ileum was normal.                           - One 2 mm polyp in the distal ascending colon,                            removed with a cold biopsy forceps. Resected and                            retrieved.                           - Normal mucosa in the entire examined colon.                            Biopsied.                           -  The examination was otherwise normal on direct                            and retroflexion views. Recommendation:           - Patient has a contact number available for                            emergencies. The signs and symptoms of potential                            delayed complications were discussed with the                            patient. Return to normal activities tomorrow.                            Written discharge instructions were provided to the                            patient.                           - Resume previous diet.                           - Continue present medications.                           - Await pathology results.                           - Repeat colonoscopy is recommended for                            surveillance. The colonoscopy date will be                            determined after pathology results from today's                            exam become available for review. Allisen Pidgeon L. Loletha Carrow, MD 04/12/2017 3:20:28 PM This report has been signed electronically.

## 2017-04-12 NOTE — Patient Instructions (Addendum)
YOU HAD AN ENDOSCOPIC PROCEDURE TODAY AT [facility]:   Refer to the procedure report that was given to you for any specific questions about what was found during the examination.  If the procedure report does not answer your questions, please call your gastroenterologist to clarify.  If you requested that your care partner not be given the details of your procedure findings, then the procedure report has been included in a sealed envelope for you to review at your convenience later.  YOU SHOULD EXPECT: Some feelings of bloating in the abdomen. Passage of more gas than usual.  Walking can help get rid of the air that was put into your GI tract during the procedure and reduce the bloating. If you had a lower endoscopy (such as a colonoscopy or flexible sigmoidoscopy) you may notice spotting of blood in your stool or on the toilet paper. If you underwent a bowel prep for your procedure, you may not have a normal bowel movement for a few days.  Please Note:  You might notice some irritation and congestion in your nose or some drainage.  This is from the oxygen used during your procedure.  There is no need for concern and it should clear up in a day or so.  SYMPTOMS TO REPORT IMMEDIATELY:   Following lower endoscopy (colonoscopy or flexible sigmoidoscopy):  Excessive amounts of blood in the stool  Significant tenderness or worsening of abdominal pains  Swelling of the abdomen that is new, acute  Fever of 100F or higher   Following upper endoscopy (EGD)  Vomiting of blood or coffee ground material  New chest pain or pain under the shoulder blades  Painful or persistently difficult swallowing  New shortness of breath  Fever of 100F or higher  Black, tarry-looking stools  For urgent or emergent issues, a gastroenterologist can be reached at any hour by calling [phone].   DIET:  We do recommend a small meal at first, but then you may proceed to your regular diet.  Drink  plenty of fluids but you should avoid alcoholic beverages for 24 hours.  MEDICATIONS: Continue present medications.  Please see handouts given to you by your recovery nurse.  Follow up appointment made with Dr. Loletha Carrow on September 6th at 4:15pm.  ACTIVITY:  You should plan to take it easy for the rest of today and you should NOT DRIVE or use heavy machinery until tomorrow (because of the sedation medicines used during the test).    FOLLOW UP: Our staff will call the number listed on your records the next business day following your procedure to check on you and address any questions or concerns that you may have regarding the information given to you following your procedure. If we do not reach you, we will leave a message.  However, if you are feeling well and you are not experiencing any problems, there is no need to return our call.  We will assume that you have returned to your regular daily activities without incident.  If any biopsies were taken you will be contacted by phone or by letter within the next 1-3 weeks.  Please call us at [phone] if you have not heard about the biopsies in 3 weeks.   Thank you for allowing [place] to provide for your healthcare needs today.   SIGNATURES/CONFIDENTIALITY: You and/or your care partner have signed paperwork which will be entered into your electronic medical record.  These signatures attest to the fact that that  the information above on your After Visit Summary has been reviewed and is understood.  Full responsibility of the confidentiality of this discharge information lies with you and/or your care-partner.

## 2017-04-13 ENCOUNTER — Telehealth: Payer: Self-pay | Admitting: *Deleted

## 2017-04-13 NOTE — Telephone Encounter (Signed)
  Follow up Call-  Call back number 04/12/2017  Post procedure Call Back phone  # [phone]  Permission to leave phone message Yes  Some recent data might be hidden     Patient questions:  Do you have a fever, pain , or abdominal swelling? No. Pain Score  0 *  Have you tolerated food without any problems? Yes.    Have you been able to return to your normal activities? Yes.    Do you have any questions about your discharge instructions: Diet   No. Medications  No. Follow up visit  No.  Do you have questions or concerns about your Care? Yes.  patient reports the she had a little "bit of blood" that she saw yesterday after she went home. She since has had no further episodes. Patient was instructed to call us if she has any further bleeding. She verbalized understanding. SM  Actions: * If pain score is 4 or above: No action needed, pain <4.

## 2017-04-13 NOTE — Telephone Encounter (Signed)
  Follow up Call-  Call back number 04/12/2017  Post procedure Call Back phone  # [phone]  Permission to leave phone message Yes  Some recent data might be hidden     Lm on number above that we will try back later today- EWM, RN

## 2017-04-17 ENCOUNTER — Ambulatory Visit (INDEPENDENT_AMBULATORY_CARE_PROVIDER_SITE_OTHER): Payer: 59

## 2017-04-17 ENCOUNTER — Encounter (INDEPENDENT_AMBULATORY_CARE_PROVIDER_SITE_OTHER): Payer: Self-pay | Admitting: Orthopaedic Surgery

## 2017-04-17 ENCOUNTER — Ambulatory Visit (INDEPENDENT_AMBULATORY_CARE_PROVIDER_SITE_OTHER): Payer: 59 | Admitting: Orthopaedic Surgery

## 2017-04-17 DIAGNOSIS — G8929 Other chronic pain: Secondary | ICD-10-CM

## 2017-04-17 DIAGNOSIS — M25511 Pain in right shoulder: Secondary | ICD-10-CM

## 2017-04-17 DIAGNOSIS — M545 Low back pain: Secondary | ICD-10-CM

## 2017-04-17 NOTE — Progress Notes (Signed)
Office Visit Note   Patient: Julia Green           Date of Birth: 09/21/1970           MRN: [id number] Visit Date: 04/17/2017              Requested by: No referring provider defined for this encounter. PCP: Cathleen Corti, PA-C   Assessment & Plan: Visit Diagnoses:  1. Chronic bilateral low back pain, with sciatica presence unspecified   2. Chronic right shoulder pain     Plan: Overall impression is lumbar radiculopathy and right shoulder pain impingement versus fibromyalgia. MRI of the lumbar spine evaluated to rule out structural abnormalities. Right subacromial shoulder injection was performed today. We will see how she responds to this and a couple weeks when she comes back to review her MRI. Total face to face encounter time was greater than 45 minutes and over half of this time was spent in counseling and/or coordination of care.  Follow-Up Instructions: Return if symptoms worsen or fail to improve.   Orders:  Orders Placed This Encounter  Procedures  . XR Lumbar Spine 2-3 Views  . XR Shoulder Right  . MR Lumbar Spine w/o contrast   No orders of the defined types were placed in this encounter.     Procedures: Large Joint Inj Date/Time: 04/17/2017 7:44 PM Performed by: Leandrew Koyanagi Authorized by: Leandrew Koyanagi   Consent Given by:  Patient Timeout: prior to procedure the correct patient, procedure, and site was verified   Indications:  Pain Location:  Shoulder Site:  R subacromial bursa Prep: patient was prepped and draped in usual sterile fashion   Needle Size:  22 G Approach:  Posterior Ultrasound Guidance: No   Fluoroscopic Guidance: No       Clinical Data: No additional findings.   Subjective: Chief Complaint  Patient presents with  . Lower Back - Pain, New Patient (Initial Visit)  . Right Shoulder - Pain    Patient is a 46 year old Hispanic female comes in with low back pain and right shoulder pain. She has been seeing her  OB/GYN for the last year for back pain. She states she has pain that radiates down her right leg and endorses numbness. The pain is worse at night. She is also complaining of right shoulder pain for about a year. She has taken ibuprofenprescribed by her primary care doctor. She denies any improvement. She endorses stabbing pains in her right shoulder.    Review of Systems  Constitutional: Negative.   HENT: Negative.   Eyes: Negative.   Respiratory: Negative.   Cardiovascular: Negative.   Endocrine: Negative.   Musculoskeletal: Negative.   Neurological: Negative.   Hematological: Negative.   Psychiatric/Behavioral: Negative.   All other systems reviewed and are negative.    Objective: Vital Signs: LMP 03/24/2017   Physical Exam  Constitutional: She is oriented to person, place, and time. She appears well-developed and well-nourished.  HENT:  Head: Normocephalic and atraumatic.  Eyes: EOM are normal.  Neck: Neck supple.  Pulmonary/Chest: Effort normal.  Abdominal: Soft.  Neurological: She is alert and oriented to person, place, and time.  Skin: Skin is warm. Capillary refill takes less than 2 seconds.  Psychiatric: She has a normal mood and affect. Her behavior is normal. Judgment and thought content normal.  Nursing note and vitals reviewed.   Ortho Exam Low back exam and lower extremity exam shows no focal motor or sensory deficits. Normal  reflexes. Negative straight leg raise test. Right shoulder exam shows a positive impingement sign. Rotator cuff testing is grossly normal somewhat limited by pain. She is also tender in the trapezius muscle bilaterally. Specialty Comments:  No specialty comments available.  Imaging: Xr Lumbar Spine 2-3 Views  Result Date: 04/17/2017 No acute or structural modalities. Grade 1 anterolisthesis  Xr Shoulder Right  Result Date: 04/17/2017 No acute or structural abnormalities    PMFS History: Patient Active Problem List   Diagnosis  Date Noted  . Abdominal bloating 03/22/2017  . Other specified hypothyroidism 11/16/2016  . Dysmenorrhea 07/20/2016  . 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  . Dyspareunia, female 06/16/2016   Past Medical History:  Diagnosis Date  . Asthma   . GERD (gastroesophageal reflux disease)   . Hypertension   . Thyroid disease     Family History  Problem Relation Age of Onset  . Hypertension Mother   . Uterine cancer Mother   . Cancer Sister        unknown  . Stomach cancer Maternal Aunt   . Colon cancer Neg Hx   . Esophageal cancer Neg Hx   . Rectal cancer Neg Hx     No past surgical history on file. Social History   Occupational History  . Not on file.   Social History Main Topics  . Smoking status: Never Smoker  . Smokeless tobacco: Never Used  . Alcohol use No  . Drug use: No  . Sexual activity: Yes    Birth control/ protection: None

## 2017-04-20 ENCOUNTER — Ambulatory Visit (INDEPENDENT_AMBULATORY_CARE_PROVIDER_SITE_OTHER): Payer: 59 | Admitting: Obstetrics & Gynecology

## 2017-04-20 ENCOUNTER — Encounter: Payer: Self-pay | Admitting: Obstetrics & Gynecology

## 2017-04-20 ENCOUNTER — Encounter: Payer: Self-pay | Admitting: Gastroenterology

## 2017-04-20 VITALS — BP 134/86

## 2017-04-20 DIAGNOSIS — R102 Pelvic and perineal pain: Secondary | ICD-10-CM

## 2017-04-20 NOTE — Progress Notes (Signed)
    Julia Green [birth year] [id number]        46 y.o.  G4P4 Married, husband present.  Visit conducted with a Spanish Interpret.  RP: Chronic pelvic pain  HPI:  Received 2 doses of DepoProvera with control of Menometrorrhagia, but continued pelvic pain/discomfort none-the-less.  Requests Hysterectomy.  Past medical history,surgical history, problem list, medications, allergies, family history and social history were all reviewed and documented in the EPIC chart.  Directed ROS with pertinent positives and negatives documented in the history of present illness/assessment and plan.  Exam:  Vitals:   04/20/17 1633  BP: 134/86   General appearance:  Normal  Pelvic US normal 03/22/2017.    Assessment/Plan:  46 y.o. G4P4   1. Pelvic pain in female Chronic pelvic pain suspicious for Endometriosis/Adenomyosis.  46 y.o. G4P4, no desire to preserve Fertility.  At patient's request, decision to proceed with an LAVH/Bilateral Salpingectomy instead of a Dx Laparoscopy.  Surgery and risks thoroughly discussed including trauma to bowels, ureters, Blood Vessels, risks of infection, risks of DVT/PE, risks of persistent pain not improved by surgery.                        Patient was counseled as to the risk of surgery to include the following:  1. Infection (prohylactic antibiotics will be administered)  2. DVT/Pulmonary Embolism (prophylactic pneumo compression stockings will be used)  3.Trauma to internal organs requiring additional surgical procedure to repair any injury to     Internal organs requiring perhaps additional hospitalization days.  4.Hemmorhage requiring transfusion and blood products which carry risks such as anaphylactic reaction, hepatitis and AIDS  Patient had received literature information on the procedure scheduled and all her questions were answered and fully accepts all risk.  Counseling on above >50% x 25 minutes  Princess Bruins MD, 5:12 PM  04/20/2017

## 2017-04-24 NOTE — Patient Instructions (Signed)
Pelvic pain in female Chronic pelvic pain suspicious for Endometriosis/Adenomyosis.  46 yo G4P4, no desire to preserve Fertility.  At patient's request, decision to proceed with an LAVH/Bilateral Salpingectomy instead of a Dx Laparoscopy.  Surgery and risks thoroughly discussed including trauma to bowels, ureters, Blood Vessels, risks of infection, risks of DVT/PE, risks of persistent pain not improved by surgery.                        Patient was counseled as to the risk of surgery to include the following:  1. Infection (prohylactic antibiotics will be administered)  2. DVT/Pulmonary Embolism (prophylactic pneumo compression stockings will be used)  3.Trauma to internal organs requiring additional surgical procedure to repair any injury to     Internal organs requiring perhaps additional hospitalization days.  4.Hemmorhage requiring transfusion and blood products which carry risks such as anaphylactic reaction, hepatitis and AIDS  Patient had received literature information on the procedure scheduled and all her questions were answered and fully accepts all risk.  Julia Green, it was a pleasure to see you today!  I will see you on the morning of surgery.

## 2017-04-26 ENCOUNTER — Telehealth: Payer: Self-pay

## 2017-04-26 NOTE — Telephone Encounter (Signed)
Incoming fax from [facility], requesting a completion of patient assistance form. Dr Loletha Carrow has completed and signed. Faxed to [facility] as directed by [facility]. Document sent to be scanned.

## 2017-04-27 ENCOUNTER — Ambulatory Visit (INDEPENDENT_AMBULATORY_CARE_PROVIDER_SITE_OTHER): Payer: 59 | Admitting: Gastroenterology

## 2017-04-27 ENCOUNTER — Encounter: Payer: Self-pay | Admitting: Gastroenterology

## 2017-04-27 VITALS — BP 122/80 | HR 76 | Ht 62.0 in | Wt 156.0 lb

## 2017-04-27 DIAGNOSIS — R634 Abnormal weight loss: Secondary | ICD-10-CM | POA: Diagnosis not present

## 2017-04-27 DIAGNOSIS — R140 Abdominal distension (gaseous): Secondary | ICD-10-CM

## 2017-04-27 DIAGNOSIS — R197 Diarrhea, unspecified: Secondary | ICD-10-CM | POA: Diagnosis not present

## 2017-04-27 MED ORDER — METRONIDAZOLE 500 MG PO TABS: 500.0000 mg | ORAL_TABLET | Freq: Two times a day (BID) | ORAL | 0 refills | Status: AC

## 2017-04-27 MED ORDER — CEPHALEXIN 250 MG PO CAPS: 250.0000 mg | ORAL_CAPSULE | Freq: Four times a day (QID) | ORAL | 0 refills | Status: AC

## 2017-04-27 NOTE — Patient Instructions (Addendum)
Food Guidelines for a sensitive stomach  Many people have difficulty digesting certain foods, causing a variety of distressing and embarrassing symptoms such as abdominal pain, bloating and gas.  These foods may need to be avoided or consumed in small amounts.  Here are some tips that might be helpful for you.  1.   Lactose intolerance is the difficulty or complete inability to digest lactose, the natural sugar in milk and anything made from milk.  This condition is harmless, common, and can begin any time during life.  Some people can digest a modest amount of lactose while others cannot tolerate any.  Also, not all dairy products contain equal amounts of lactose.  For example, hard cheeses such as parmesan have less lactose than soft cheeses such as cheddar.  Yogurt has less lactose than milk or cheese.  Many packaged foods (even many brands of bread) have milk, so read ingredient lists carefully.  It is difficult to test for lactose intolerance, so just try avoiding lactose as much as possible for a week and see what happens with your symptoms.  If you seem to be lactose intolerant, the best plan is to avoid it (but make sure you get calcium from another source).  The next best thing is to use lactase enzyme supplements, available over the counter everywhere.  Just know that many lactose intolerant people need to take several tablets with each serving of dairy to avoid symptoms.  Lastly, a lot of restaurant food is made with milk or butter.  Many are things you might not suspect, such as mashed potatoes, rice and pasta (cooked with butter) and "grilled" items.  If you are lactose intolerant, it never hurts to ask your server what has milk or butter.  2.   Fiber is an important part of your diet, but not all fiber is well-tolerated.  Insoluble fiber such as bran is often consumed by normal gut bacteria and converted into gas.  Soluble fiber such as oats, squash, carrots and green beans are typically  tolerated better.  3.   Some types of carbohydrates can be poorly digested.  Examples include: fructose (apples, cherries, pears, raisins and other dried fruits), fructans (onions, zucchini, large amounts of wheat), sorbitol/mannitol/xylitol and sucralose/Splenda (common artificial sweeteners), and raffinose (lentils, broccoli, cabbage, asparagus, brussel sprouts, many types of beans).  Do a Development worker, community for [employer] and you will find helpful information. Beano, a dietary supplement, will often help with raffinose-containing foods.  As with lactase tablets, you may need several per serving.  4.   Whenever possible, avoid processed food&meats and chemical additives.  High fructose corn syrup, a common sweetener, may be difficult to digest.  Eggs and soy (comes from the soybean, and added to many foods now) are the other most common bloating/gassy foods   Stop the omeprazole for 7 days to see if diarrhea improves.  Stop taking the creon (pancreatic enzyme)   You have been scheduled for a CT scan of the abdomen and pelvis at [facility] ([address]---this is in the same building as Press [facility]).   You are scheduled on 05-05-2017 at [place] should arrive 15 minutes prior to your appointment time for registration. Please follow the written instructions below on the day of your exam:  WARNING: IF YOU ARE ALLERGIC TO IODINE/X-RAY DYE, PLEASE NOTIFY RADIOLOGY IMMEDIATELY AT [phone]! YOU WILL BE GIVEN A 13 HOUR PREMEDICATION PREP.  1) Do not eat anything after 6am (4 hours prior  to your test) 2) You have been given 2 bottles of oral contrast to drink. The solution may taste               better if refrigerated, but do NOT add ice or any other liquid to this solution. Shake             well before drinking.    Drink 1 bottle of contrast @ 9am (2 hours prior to your exam)   You may take any medications as prescribed with a small amount of water except for the following:  Metformin, Glucophage, Glucovance, Avandamet, Riomet, Fortamet, Actoplus Met, Janumet, Glumetza or Metaglip. The above medications must be held the day of the exam AND 48 hours after the exam.  The purpose of you drinking the oral contrast is to aid in the visualization of your intestinal tract. The contrast solution may cause some diarrhea. Before your exam is started, you will be given a small amount of fluid to drink. Depending on your individual set of symptoms, you may also receive an intravenous injection of x-ray contrast/dye. Plan on being at [facility] for 30 minutes or longer, depending on the type of exam you are having performed.  This test typically takes 30-45 minutes to complete.  If you have any questions regarding your exam or if you need to reschedule, you may call the CT department at [phone] between the hours of 8:00 am and 5:00 pm, Monday-Friday.  Thank you for choosing [facility]  Dr Wilfrid Lund III

## 2017-04-27 NOTE — Progress Notes (Signed)
Julia Green  Chief Complaint: Diarrhea and loss  Subjective  History:  Julia Green was seen with the aid of a Spanish interpreter. She feels much the same as when I last saw her, with abdominal bloating and several semi-formed but not watery and nonbloody stools per day. Fortunately, her weight loss has stopped. Tight is reasonably good. She denies dysphagia, nausea or early satiety. Recent upper endoscopy and colonoscopy were normal except for a small adenomatous polyp. Biopsies of the duodenum and right and left colon were normal. These were done to rule out microscopic colitis and also celiac sprue or changes of bacterial overgrowth. The patient's initial workup revealed a low fecal elastase, she has had no improvement in symptoms on sufficient dose of Creon. She is also concerned about its expense.  ROS: Cardiovascular:  no chest pain Respiratory: no dyspnea  The patient's Past Medical, Family and Social History were reviewed and are on file in the EMR.  Objective:  Med list reviewed  Current Outpatient Prescriptions:  .  aspirin EC 81 MG tablet, Take 81 mg by mouth daily., Disp: , Rfl:  .  levothyroxine (SYNTHROID, LEVOTHROID) 137 MCG tablet, Take 1 tablet (137 mcg total) by mouth daily before breakfast., Disp: 30 tablet, Rfl: 12 .  lipase/protease/amylase (CREON) 36000 UNITS CPEP capsule, 36,000 unit capsules. Take 2 with each meal and 1 with snacks, Disp: 240 capsule, Rfl: 3 .  lisinopril-hydrochlorothiazide (PRINZIDE,ZESTORETIC) 10-12.5 MG tablet, Take 1 tablet by mouth daily., Disp: , Rfl:  .  montelukast (SINGULAIR) 10 MG tablet, Take 10 mg by mouth at bedtime., Disp: , Rfl:  .  Multiple Vitamin (MULTI-VITAMIN DAILY PO), Take 1 tablet by mouth daily., Disp: , Rfl:  .  omeprazole (PRILOSEC) 40 MG capsule, Take 1 capsule (40 mg total) by mouth daily., Disp: 30 capsule, Rfl: 2 .  tranexamic acid (LYSTEDA) 650 MG TABS tablet, Take 2 tablets by mouth daily three  times daily for 5 days to help with bleeding, Disp: 30 tablet, Rfl: 0 .  vitamin C (ASCORBIC ACID) 500 MG tablet, Take 500 mg by mouth daily., Disp: , Rfl:  .  cephALEXin (KEFLEX) 250 MG capsule, Take 1 capsule (250 mg total) by mouth 4 (four) times daily., Disp: 28 capsule, Rfl: 0 .  metroNIDAZOLE (FLAGYL) 500 MG tablet, Take 1 tablet (500 mg total) by mouth 2 (two) times daily., Disp: 14 tablet, Rfl: 0  Current Facility-Administered Medications:  .  0.9 %  sodium chloride infusion, 500 mL, Intravenous, Continuous, Danis, Estill Cotta III, MD   Vital signs in last 24 hrs: Vitals:   04/27/17 1624  BP: 122/80  Pulse: 76    Physical Exam  Interpreter present for entire encounter  HEENT: sclera anicteric, oral mucosa moist without lesions  Neck: supple, no thyromegaly, JVD or lymphadenopathy  Cardiac: RRR without murmurs, S1S2 heard, no peripheral edema  Pulm: clear to auscultation bilaterally, normal RR and effort noted  Abdomen: soft, No tenderness, with active bowel sounds. No guarding or palpable hepatosplenomegaly.  Skin; warm and dry, no jaundice or rash  Recent Labs:   Small bowel biopsies normal Random colon biopsies normal Tubular adenoma, less than 1 cm   @ASSESSMENTPLANBEGIN @ Assessment: Encounter Diagnoses  Name Primary?  . Diarrhea, unspecified type Yes  . Abnormal loss of weight   . Abdominal bloating   . Abnormal weight loss     It seems she may just turn out to have irritable bowel. At the initial consult, she reported  no improvement in diarrhea with Imodium, Lomotil or Bentyl. There was significant reported weight loss over the last 6-8 months before the initial office consult, but that seems to have stopped. This led to some concern about malabsorptive syndrome, but the significance of the low fecal elastase is currently unclear. She has no chronic epigastric pain or risk factors for pancreatitis. Other less, I think we need to know with certainty if her  pancreas appears normal given the symptom complex. She does not seem to have risk factors for bacterial overgrowth. However, I'm going to empirically treat her even these persistent symptoms and the initial weight loss.  Plan: 7 days of Keflex and Flagyl CT scan abdomen with oral and IV contrast to rule out chronic pancreatitis  Written dietary advice given Stop omeprazole for a week. If no significant improvement in diarrhea, she can go back on it.   Total time 30 minutes, over half spent in record review , counseling and coordination of care. Extra time required using interpreter services.  Nelida Meuse III

## 2017-05-01 ENCOUNTER — Ambulatory Visit (INDEPENDENT_AMBULATORY_CARE_PROVIDER_SITE_OTHER): Payer: 59 | Admitting: Orthopaedic Surgery

## 2017-05-05 ENCOUNTER — Ambulatory Visit (INDEPENDENT_AMBULATORY_CARE_PROVIDER_SITE_OTHER)
Admission: RE | Admit: 2017-05-05 | Discharge: 2017-05-05 | Disposition: A | Discharge status: Home / Self Care | Payer: 59 | Source: Ambulatory Visit | Attending: Gastroenterology | Admitting: Gastroenterology

## 2017-05-05 DIAGNOSIS — R140 Abdominal distension (gaseous): Secondary | ICD-10-CM

## 2017-05-05 DIAGNOSIS — R634 Abnormal weight loss: Secondary | ICD-10-CM

## 2017-05-05 DIAGNOSIS — R197 Diarrhea, unspecified: Secondary | ICD-10-CM

## 2017-05-05 MED ORDER — IOPAMIDOL (ISOVUE-300) INJECTION 61%
80.0000 mL | Freq: Once | INTRAVENOUS | Status: AC | PRN
Start: 2017-05-05 — End: 2017-05-05
  Administered 2017-05-05: 80 mL via INTRAVENOUS

## 2017-05-08 ENCOUNTER — Encounter (HOSPITAL_BASED_OUTPATIENT_CLINIC_OR_DEPARTMENT_OTHER): Admission: RE | Payer: Self-pay | Source: Ambulatory Visit

## 2017-05-08 ENCOUNTER — Ambulatory Visit (HOSPITAL_BASED_OUTPATIENT_CLINIC_OR_DEPARTMENT_OTHER): Admission: RE | Admit: 2017-05-08 | Payer: 59 | Source: Ambulatory Visit | Admitting: Obstetrics & Gynecology

## 2017-05-08 SURGERY — LAPAROSCOPY, DIAGNOSTIC
Anesthesia: General

## 2017-05-09 ENCOUNTER — Telehealth: Payer: Self-pay | Admitting: Gastroenterology

## 2017-05-09 ENCOUNTER — Ambulatory Visit
Admission: RE | Admit: 2017-05-09 | Discharge: 2017-05-09 | Disposition: A | Discharge status: Home / Self Care | Payer: 59 | Source: Ambulatory Visit | Attending: Orthopaedic Surgery | Admitting: Orthopaedic Surgery

## 2017-05-09 DIAGNOSIS — G8929 Other chronic pain: Secondary | ICD-10-CM

## 2017-05-09 DIAGNOSIS — M545 Low back pain: Principal | ICD-10-CM

## 2017-05-09 NOTE — Telephone Encounter (Signed)
Sent Dr. Loletha Carrow a note through results.

## 2017-05-12 ENCOUNTER — Encounter: Payer: Self-pay | Admitting: Anesthesiology

## 2017-05-15 ENCOUNTER — Ambulatory Visit (INDEPENDENT_AMBULATORY_CARE_PROVIDER_SITE_OTHER): Payer: 59 | Admitting: Orthopaedic Surgery

## 2017-05-15 DIAGNOSIS — M47816 Spondylosis without myelopathy or radiculopathy, lumbar region: Secondary | ICD-10-CM

## 2017-05-15 DIAGNOSIS — G8929 Other chronic pain: Secondary | ICD-10-CM

## 2017-05-15 DIAGNOSIS — M25511 Pain in right shoulder: Secondary | ICD-10-CM | POA: Diagnosis not present

## 2017-05-15 NOTE — Progress Notes (Signed)
Office Visit Note   Patient: Fradel Baldonado           Date of Birth: 14-Mar-1971           MRN: [id number] Visit Date: 05/15/2017              Requested by: Cathleen Corti, PA-C [place], [place] [address] PCP: Cathleen Corti, PA-C   Assessment & Plan: Visit Diagnoses:  1. Spondylosis of lumbar region without myelopathy or radiculopathy     Plan: For her back her MRI shows significant facet disease with swelling at L4-5. Recommend facet blocks with Dr. Ernestina Patches. For her shoulder, she has failed conservative treatment. I recommend MRI of the right shoulder to rule out structural mellitus. Follow-up after the MRI.  Follow-Up Instructions: Return in about 2 weeks (around 05/29/2017).   Orders:  No orders of the defined types were placed in this encounter.  No orders of the defined types were placed in this encounter.     Procedures: No procedures performed   Clinical Data: No additional findings.   Subjective: Chief Complaint  Patient presents with  . Lower Back - Pain, Follow-up    Patient follows up today for her lumbar spine MRI and right shoulder pain. She received a subacromial injection at her last visit without any significant relief. Denies any numbness and tingling. Her back is stable.    Review of Systems  Constitutional: Negative.   HENT: Negative.   Eyes: Negative.   Respiratory: Negative.   Cardiovascular: Negative.   Endocrine: Negative.   Musculoskeletal: Negative.   Neurological: Negative.   Hematological: Negative.   Psychiatric/Behavioral: Negative.   All other systems reviewed and are negative.    Objective: Vital Signs: There were no vitals taken for this visit.  Physical Exam  Constitutional: She is oriented to person, place, and time. She appears well-developed and well-nourished.  Pulmonary/Chest: Effort normal.  Neurological: She is alert and oriented to person, place, and time.  Skin: Skin is warm.  Capillary refill takes less than 2 seconds.  Psychiatric: She has a normal mood and affect. Her behavior is normal. Judgment and thought content normal.  Nursing note and vitals reviewed.   Ortho Exam Right shoulder exam is stable. Specialty Comments:  No specialty comments available.  Imaging: No results found.   PMFS History: Patient Active Problem List   Diagnosis Date Noted  . 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  . Abdominal bloating 03/22/2017  . Other specified hypothyroidism 11/16/2016  . Dysmenorrhea 07/20/2016  . 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  . Dyspareunia, female 06/16/2016   Past Medical History:  Diagnosis Date  . Asthma   . GERD (gastroesophageal reflux disease)   . Hypertension   . Thyroid disease     Family History  Problem Relation Age of Onset  . Hypertension Mother   . Uterine cancer Mother   . Cancer Sister        unknown  . Stomach cancer Maternal Aunt   . Colon cancer Neg Hx   . Esophageal cancer Neg Hx   . Rectal cancer Neg Hx     No past surgical history on file. Social History   Occupational History  . Not on file.   Social History Main Topics  . Smoking status: Never Smoker  . Smokeless tobacco: Never Used  . Alcohol use No  . Drug use: No  . Sexual activity: Yes    Birth control/ protection: None

## 2017-05-15 NOTE — Addendum Note (Signed)
Addended by: Precious Bard on: 05/15/2017 03:58 PM   Modules accepted: Orders

## 2017-05-27 ENCOUNTER — Ambulatory Visit
Admission: RE | Admit: 2017-05-27 | Discharge: 2017-05-27 | Disposition: A | Discharge status: Home / Self Care | Payer: 59 | Source: Ambulatory Visit | Attending: Orthopaedic Surgery | Admitting: Orthopaedic Surgery

## 2017-05-27 DIAGNOSIS — G8929 Other chronic pain: Secondary | ICD-10-CM

## 2017-05-27 DIAGNOSIS — M25511 Pain in right shoulder: Principal | ICD-10-CM

## 2017-05-30 ENCOUNTER — Ambulatory Visit (INDEPENDENT_AMBULATORY_CARE_PROVIDER_SITE_OTHER): Payer: 59 | Admitting: Orthopaedic Surgery

## 2017-05-30 DIAGNOSIS — M75101 Unspecified rotator cuff tear or rupture of right shoulder, not specified as traumatic: Secondary | ICD-10-CM | POA: Insufficient documentation

## 2017-05-30 NOTE — Progress Notes (Signed)
Office Visit Note   Patient: Julia Green           Date of Birth: [birth year]           MRN: [id number] Visit Date: 05/30/2017              Requested by: Cathleen Corti, PA-C [place], [place] [address] PCP: Cathleen Corti, PA-C   Assessment & Plan: Visit Diagnoses:  1. Rotator cuff syndrome of right shoulder     Plan: Her MRI shows articular and bursal sided degenerative tears of her infraspinatus mainly along with a infraspinatus strain of the muscle belly. Patient has failed physical therapy and subacromial injection at this point. We did discuss shoulder arthroscopy with rotator cuff debridement and repair as indicated and likely distal clavicle excision. We discussed the risks benefits alternatives to surgery she understands and will likely proceed with surgery. She is supposed to have a hysterectomy in the next month. I've asked her to give [place] a call when she has been released by her OB/GYN for the shoulder surgery. She understands recovery is around 3 months. We will go ahead and put her on light duty. Today's encounter was made more complex due to the language barrier for which we had a Spanish interpreter for  Follow-Up Instructions: Return if symptoms worsen or fail to improve.   Orders:  No orders of the defined types were placed in this encounter.  No orders of the defined types were placed in this encounter.     Procedures: No procedures performed   Clinical Data: No additional findings.   Subjective: Chief Complaint  Patient presents with  . Right Shoulder - Pain    Julia Green follows up today for right shoulder MRI. She continues to have pain with use of her arm. 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    Review of Systems  Constitutional: Negative.   HENT: Negative.   Eyes: Negative.   Respiratory: Negative.   Cardiovascular: Negative.   Endocrine: Negative.   Musculoskeletal: Negative.     Neurological: Negative.   Hematological: Negative.   Psychiatric/Behavioral: Negative.   All other systems reviewed and are negative.    Objective: Vital Signs: There were no vitals taken for this visit.  Physical Exam  Constitutional: She is oriented to person, place, and time. She appears well-developed and well-nourished.  Pulmonary/Chest: Effort normal.  Neurological: She is alert and oriented to person, place, and time.  Skin: Skin is warm. Capillary refill takes less than 2 seconds.  Psychiatric: She has a normal mood and affect. Her behavior is normal. Judgment and thought content normal.  Nursing note and vitals reviewed.   Ortho Exam Right shoulder exam shows pain with testing of supraspinatus and infraspinatus with positive impingement sign and positive cross adduction sign. Specialty Comments:  No specialty comments available.  Imaging: No results found.   PMFS History: Patient Active Problem List   Diagnosis Date Noted  . Rotator cuff syndrome of right shoulder 05/30/2017  . 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  . Abdominal bloating 03/22/2017  . Other specified hypothyroidism 11/16/2016  . Dysmenorrhea 07/20/2016  . 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  . Dyspareunia, female 06/16/2016   Past Medical History:  Diagnosis Date  . Asthma   . GERD (gastroesophageal reflux disease)   . Hypertension   . Thyroid disease     Family History  Problem Relation Age of Onset  .  Hypertension Mother   . Uterine cancer Mother   . Cancer Sister        unknown  . Stomach cancer Maternal Aunt   . Colon cancer Neg Hx   . Esophageal cancer Neg Hx   . Rectal cancer Neg Hx     No past surgical history on file. Social History   Occupational History  . Not on file.   Social History Main Topics  . Smoking status: Never Smoker  . Smokeless tobacco: Never Used  . Alcohol use No  . Drug use: No  . Sexual activity: Yes    Birth control/  protection: None

## 2017-06-05 ENCOUNTER — Encounter (INDEPENDENT_AMBULATORY_CARE_PROVIDER_SITE_OTHER): Payer: Self-pay | Admitting: Physical Medicine and Rehabilitation

## 2017-06-05 ENCOUNTER — Ambulatory Visit (INDEPENDENT_AMBULATORY_CARE_PROVIDER_SITE_OTHER): Payer: 59 | Admitting: Physical Medicine and Rehabilitation

## 2017-06-05 ENCOUNTER — Ambulatory Visit (INDEPENDENT_AMBULATORY_CARE_PROVIDER_SITE_OTHER): Payer: Self-pay

## 2017-06-05 VITALS — BP 134/81 | HR 62 | Temp 98.4°F

## 2017-06-05 DIAGNOSIS — M47816 Spondylosis without myelopathy or radiculopathy, lumbar region: Secondary | ICD-10-CM | POA: Diagnosis not present

## 2017-06-05 MED ORDER — LIDOCAINE HCL 1 % IJ SOLN
2.0000 mL | Freq: Once | INTRAMUSCULAR | Status: AC
  Administered 2017-06-05: 2 mL

## 2017-06-05 MED ORDER — METHYLPREDNISOLONE ACETATE 80 MG/ML IJ SUSP
80.0000 mg | Freq: Once | INTRAMUSCULAR | Status: AC
  Administered 2017-06-05: 80 mg

## 2017-06-05 NOTE — Progress Notes (Deleted)
Pt here for injection of bilateral hip pain, pt does have a driver, no allergy to contrast dye. Pt taking 81 mg aspirin but has not taken today. Pian in both sides hip, Right side is most pain, has numbness in leg right side down to foot. Pain is worse when walking or bending down to get something.

## 2017-06-05 NOTE — Patient Instructions (Signed)

## 2017-06-08 ENCOUNTER — Other Ambulatory Visit: Payer: 59

## 2017-06-13 ENCOUNTER — Encounter: Payer: Self-pay | Admitting: Anesthesiology

## 2017-06-15 ENCOUNTER — Encounter (HOSPITAL_COMMUNITY): Payer: Self-pay

## 2017-06-15 ENCOUNTER — Encounter (HOSPITAL_COMMUNITY)
Admission: RE | Admit: 2017-06-15 | Discharge: 2017-06-15 | Disposition: A | Discharge status: Home / Self Care | Payer: 59 | Source: Ambulatory Visit | Attending: Obstetrics & Gynecology | Admitting: Obstetrics & Gynecology

## 2017-06-15 ENCOUNTER — Other Ambulatory Visit: Payer: Self-pay

## 2017-06-15 DIAGNOSIS — R102 Pelvic and perineal pain: Secondary | ICD-10-CM | POA: Insufficient documentation

## 2017-06-15 DIAGNOSIS — Z01818 Encounter for other preprocedural examination: Secondary | ICD-10-CM | POA: Diagnosis present

## 2017-06-15 DIAGNOSIS — N946 Dysmenorrhea, unspecified: Secondary | ICD-10-CM | POA: Diagnosis not present

## 2017-06-15 HISTORY — DX: Sciatica, unspecified side: M54.30

## 2017-06-15 HISTORY — DX: Major depressive disorder, single episode, unspecified: F32.9

## 2017-06-15 HISTORY — DX: Headache: R51

## 2017-06-15 HISTORY — DX: Depression, unspecified: F32.A

## 2017-06-15 HISTORY — DX: Anesthesia of skin: R20.0

## 2017-06-15 HISTORY — DX: Calculus of gallbladder without cholecystitis without obstruction: K80.20

## 2017-06-15 HISTORY — DX: Hypothyroidism, unspecified: E03.9

## 2017-06-15 HISTORY — DX: Fatty (change of) liver, not elsewhere classified: K76.0

## 2017-06-15 HISTORY — DX: Unspecified osteoarthritis, unspecified site: M19.90

## 2017-06-15 HISTORY — DX: Headache, unspecified: R51.9

## 2017-06-15 HISTORY — DX: Personal history of other endocrine, nutritional and metabolic disease: Z86.39

## 2017-06-15 LAB — BASIC METABOLIC PANEL
Anion gap: 8 (ref 5–15)
BUN: 12 mg/dL (ref 6–20)
CHLORIDE: 101 mmol/L (ref 101–111)
CO2: 26 mmol/L (ref 22–32)
CREATININE: 0.7 mg/dL (ref 0.44–1.00)
Calcium: 9.4 mg/dL (ref 8.9–10.3)
GFR calc non Af Amer: >60 mL/min (ref >60)
Glucose, Bld: 83 mg/dL (ref 65–99)
Potassium: 4 mmol/L (ref 3.5–5.1)
SODIUM: 135 mmol/L (ref 135–145)

## 2017-06-15 LAB — CBC
HEMATOCRIT: 37.4 % (ref 36.0–46.0)
HEMOGLOBIN: 12.8 g/dL (ref 12.0–15.0)
MCH: 30.5 pg (ref 26.0–34.0)
MCHC: 34.2 g/dL (ref 30.0–36.0)
MCV: 89.3 fL (ref 78.0–100.0)
Platelets: 329 10*3/uL (ref 150–400)
RBC: 4.19 MIL/uL (ref 3.87–5.11)
RDW: 13.7 % (ref 11.5–15.5)
WBC: 9.6 10*3/uL (ref 4.0–10.5)

## 2017-06-15 LAB — TYPE AND SCREEN
ABO/RH(D): B POS
ANTIBODY SCREEN: NEGATIVE

## 2017-06-15 LAB — ABO/RH: ABO/RH(D): B POS

## 2017-06-15 NOTE — Procedures (Signed)
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  She does not have much in the way of central canal narrowing or small protrusion.  Her biggest complaint is low back and hip pain standing or walking.  She feels that the right is worse than the left.  She is followed by Dr. Wyline Copas who has also follow her for rotator cuff problems.  An MRI was obtained we are going to complete diagnostic and hopefully therapeutic bilateral L4-5 facet joint injections.  Depending on relief would look at epidural injection.  She would also benefit from core strengthening and stabilization.  Unfortunately in the future this may represent more of an unstable area may need spine surgery referral.  Lumbar Facet Joint Intra-Articular Injection(s) with Fluoroscopic Guidance  Patient: Julia Green      Date of Birth: February 25, 1971 MRN: [id number] PCP: Cathleen Corti, PA-C      Visit Date: 06/05/2017   Universal Protocol:    Date/Time: 06/05/2017  Consent Given By: the patient  Position: PRONE   Additional Comments: Vital signs were monitored before and after the procedure. Patient was prepped and draped in the usual sterile fashion. The correct patient, procedure, and site was verified.   Injection Procedure Details:  Procedure Site One Meds Administered:  Meds ordered this encounter  Medications  . methylPREDNISolone acetate (DEPO-MEDROL) injection 80 mg  . lidocaine (XYLOCAINE) 1 % (with pres) injection 2 mL     Laterality: Bilateral  Location/Site:  L4-L5  Needle size: 22 guage  Needle type: Spinal  Needle Placement: Articular  Findings:  -Contrast Used: 1 mL iohexol 180 mg iodine/mL   -Comments: Excellent flow of contrast producing a partial arthrogram.  Procedure Details: The fluoroscope beam is vertically oriented in AP, and the inferior recess is visualized beneath the lower pole of the inferior  apophyseal process, which represents the target point for needle insertion. When direct visualization is difficult the target point is located at the medial projection of the vertebral pedicle. The region overlying each aforementioned target is locally anesthetized with a 1 to 2 ml. volume of 1% Lidocaine without Epinephrine.   The spinal needle was inserted into each of the above mentioned facet joints using biplanar fluoroscopic guidance. A 0.25 to 0.5 ml. volume of Isovue-250 was injected and a partial facet joint arthrogram was obtained. A single spot film was obtained of the resulting arthrogram.    One to 1.25 ml of the steroid/anesthetic solution was then injected into each of the facet joints noted above.   Additional Comments:  The patient tolerated the procedure well Dressing: Band-Aid    Post-procedure details: Patient was observed during the procedure. Post-procedure instructions were reviewed.  Patient left the clinic in stable condition.

## 2017-06-15 NOTE — Patient Instructions (Addendum)
Your procedure is scheduled on:  Monday, Nov. 5, 2018  Enter through the [employer] [facility] at:  6:00 AM  Pick up the phone at the desk and dial [phone].  Call this number if you have problems the morning of surgery: [phone].  Remember: Do NOT eat food or drink after:  Midnight Sunday  Take these medicines the morning of surgery with a SIP OF WATER:  Levothyroxine (Synthroid), Omeprazole (Prilosec)  Stop ALL herbal medications at this time  Do NOT smoke the day of surgery.  Do NOT wear jewelry (body piercing), metal hair clips/bobby pins, make-up, artifical eyelashes or nail polish. Do NOT wear lotions, powders, or perfumes.  You may wear deodorant. Do NOT shave for 48 hours prior to surgery. Do NOT bring valuables to the hospital. Contacts, dentures, or bridgework may not be worn into surgery.  Leave suitcase in car.  After surgery it may be brought to your room.  For patients admitted to the hospital, checkout time is 11:00 AM the day of discharge.  Bring a copy of your healthcare power of attorney and living will documents.  Instrucciones:  Su cirugia esta programada para-( your procedure is scheduled on) :  Monday, Nov. 5, 2018   Entre por la entrada principal a la(s) -(enter through the main entrance at): 6:00 AM [address]  [place] el [phone] e informenos de su llegada ( pick up phone, dial [phone] on arrival)  Por favor llame al [phone] si tiene algun problema la Julia Green ( please call [phone] if you have any problems the morning of surgery.)  Recuerde: (Remember)  No coma alimentos ni tome liquidos, incluyendo agua, despues de la medianoche del  ( Do not eat food or drink liquids including water after midnight on  Midnight Sunday  Tome estas medicinas la manana de la cirugia con un sorbito de agua (take these meds the morning of surgery with a SIP of water)  Levothyroxine (Synthroid), Omeprazole (Prilosec) Puede cepillarse  los dientes en la manana de la [place]. (you may brush your teeth the morning of surgery)  NO use joyas, maquillaje de ojos, lapiz labial, crema para el cuerpo o esmalte de unas oscuro - las unas de los pies pueden estar pintados. ( Do not wear jewelry, eye makeup, lipstick, body lotion, or dark fingernail polish)  Puede usar desodorante ( you may wear deodorant)  Si va a ser ingresado despues de las [place], deje la [place] en el carro hasta que se le haya asignado una habitacion. ( If you are to be admitted after surgery, leave suitcase in car until your room has been assigned.)  Use ropa suelta y comoda de regreso a Manufacturing engineer. ( wear loose comfortable clothes for ride home)  Gamaliel (patient signature) ______________________________________

## 2017-06-16 ENCOUNTER — Telehealth: Payer: Self-pay | Admitting: Gastroenterology

## 2017-06-16 NOTE — Telephone Encounter (Signed)
Patient was instructed to call back if not better.

## 2017-06-19 ENCOUNTER — Ambulatory Visit (INDEPENDENT_AMBULATORY_CARE_PROVIDER_SITE_OTHER): Payer: 59 | Admitting: Orthopaedic Surgery

## 2017-06-20 ENCOUNTER — Ambulatory Visit (INDEPENDENT_AMBULATORY_CARE_PROVIDER_SITE_OTHER): Payer: 59 | Admitting: Orthopaedic Surgery

## 2017-06-20 ENCOUNTER — Ambulatory Visit: Payer: 59 | Admitting: Obstetrics & Gynecology

## 2017-06-21 NOTE — Telephone Encounter (Signed)
Extensive testing negative.  Seems like irritable bowel.  We have tried some meds that have not helped. I recommend imodium as needed for loose stools. Stress reduction will probably help.  If she is still considering surgery with her gynecologist (as she was when she first came in June), then I think she should discuss that with them. I have not found anything that would get in the way of that.

## 2017-06-22 NOTE — Telephone Encounter (Signed)
Spoke with patient states she will relay this to her OBGYN doctor and will take Imodium for the diarrhea.

## 2017-06-22 NOTE — Telephone Encounter (Signed)
Spoke with patient states when she takes Immodium her diarrhea gets better but wakes up in the middle of the night with very bad cramping. Pt is asking what she should do about the cramping.

## 2017-06-22 NOTE — Telephone Encounter (Signed)
Dr. Loletha Carrow suggests she contact her GYN doctor to discuss the cramping. She is taking the imodium to control the diarrhea. I do see in Epic that she is scheduled for surgery. Can you please relay this to her, thank you.

## 2017-06-23 ENCOUNTER — Telehealth: Payer: Self-pay

## 2017-06-23 NOTE — Telephone Encounter (Signed)
At around 3 pm today I got a call from Jeneen Rinks at the [facility]. He checked on PA for patient's upcoming surgery 06/26/17 at 7:30am and became aware that the place of service on the authorization did not reflect [facility].  I called [employer] and spoke with Magda Paganini. She said it showed doctor's office as POS.  She said since case approved and closed we will have to open a new case to be able to revise POS.  She did that for me and opened a new PA case with PA #[id number].  She said she was sending an "Escalation form High priority" and asked me to send clinicals asap. I faxed the clinicals to the number she provided right away.  I called and provided new ref # to [facility].

## 2017-06-26 ENCOUNTER — Observation Stay (HOSPITAL_COMMUNITY)
Admission: RE | Admit: 2017-06-26 | Discharge: 2017-06-27 | Disposition: A | Discharge status: Home / Self Care | Payer: 59 | Source: Ambulatory Visit | Attending: Obstetrics & Gynecology | Admitting: Obstetrics & Gynecology

## 2017-06-26 ENCOUNTER — Encounter (HOSPITAL_COMMUNITY): Payer: Self-pay

## 2017-06-26 ENCOUNTER — Ambulatory Visit (HOSPITAL_COMMUNITY): Payer: 59 | Admitting: Anesthesiology

## 2017-06-26 ENCOUNTER — Encounter (HOSPITAL_COMMUNITY)
Admission: RE | Disposition: A | Discharge status: Home / Self Care | Payer: Self-pay | Source: Ambulatory Visit | Attending: Obstetrics & Gynecology

## 2017-06-26 DIAGNOSIS — Z79899 Other long term (current) drug therapy: Secondary | ICD-10-CM | POA: Diagnosis not present

## 2017-06-26 DIAGNOSIS — Z7982 Long term (current) use of aspirin: Secondary | ICD-10-CM | POA: Diagnosis not present

## 2017-06-26 DIAGNOSIS — N946 Dysmenorrhea, unspecified: Secondary | ICD-10-CM | POA: Insufficient documentation

## 2017-06-26 DIAGNOSIS — N838 Other noninflammatory disorders of ovary, fallopian tube and broad ligament: Secondary | ICD-10-CM | POA: Diagnosis not present

## 2017-06-26 DIAGNOSIS — I10 Essential (primary) hypertension: Secondary | ICD-10-CM | POA: Diagnosis not present

## 2017-06-26 DIAGNOSIS — E039 Hypothyroidism, unspecified: Secondary | ICD-10-CM | POA: Diagnosis not present

## 2017-06-26 DIAGNOSIS — F329 Major depressive disorder, single episode, unspecified: Secondary | ICD-10-CM | POA: Insufficient documentation

## 2017-06-26 DIAGNOSIS — N800 Endometriosis of uterus: Secondary | ICD-10-CM | POA: Diagnosis not present

## 2017-06-26 DIAGNOSIS — N921 Excessive and frequent menstruation with irregular cycle: Secondary | ICD-10-CM

## 2017-06-26 DIAGNOSIS — K219 Gastro-esophageal reflux disease without esophagitis: Secondary | ICD-10-CM | POA: Insufficient documentation

## 2017-06-26 DIAGNOSIS — J45909 Unspecified asthma, uncomplicated: Secondary | ICD-10-CM | POA: Diagnosis not present

## 2017-06-26 DIAGNOSIS — Z79890 Hormone replacement therapy (postmenopausal): Secondary | ICD-10-CM | POA: Insufficient documentation

## 2017-06-26 DIAGNOSIS — R102 Pelvic and perineal pain: Secondary | ICD-10-CM | POA: Insufficient documentation

## 2017-06-26 DIAGNOSIS — Z9889 Other specified postprocedural states: Secondary | ICD-10-CM

## 2017-06-26 HISTORY — PX: LAPAROSCOPIC VAGINAL HYSTERECTOMY WITH SALPINGECTOMY: SHX6680

## 2017-06-26 LAB — PREGNANCY, URINE: Preg Test, Ur: NEGATIVE

## 2017-06-26 SURGERY — HYSTERECTOMY, VAGINAL, LAPAROSCOPY-ASSISTED, WITH SALPINGECTOMY
Anesthesia: General | Laterality: Bilateral

## 2017-06-26 MED ORDER — ROCURONIUM BROMIDE 100 MG/10ML IV SOLN
INTRAVENOUS | Status: AC
  Filled 2017-06-26: qty 1

## 2017-06-26 MED ORDER — HYDROMORPHONE HCL 1 MG/ML IJ SOLN
0.5000 mg | INTRAMUSCULAR | Status: DC | PRN
  Administered 2017-06-26 (×2): 0.5 mg via INTRAVENOUS
  Filled 2017-06-26 (×2): qty 0.5

## 2017-06-26 MED ORDER — CEFAZOLIN SODIUM-DEXTROSE 2-4 GM/100ML-% IV SOLN
2.0000 g | INTRAVENOUS | Status: AC
  Administered 2017-06-26: 2 g via INTRAVENOUS

## 2017-06-26 MED ORDER — FENTANYL CITRATE (PF) 100 MCG/2ML IJ SOLN
INTRAMUSCULAR | Status: DC | PRN
  Administered 2017-06-26 (×2): 100 ug via INTRAVENOUS
  Administered 2017-06-26: 50 ug via INTRAVENOUS

## 2017-06-26 MED ORDER — LACTATED RINGERS IV SOLN
INTRAVENOUS | Status: DC
  Administered 2017-06-26: 10:00:00 via INTRAVENOUS
  Administered 2017-06-26: 125 mL/h via INTRAVENOUS
  Administered 2017-06-26 (×2): via INTRAVENOUS

## 2017-06-26 MED ORDER — ONDANSETRON HCL 4 MG/2ML IJ SOLN
INTRAMUSCULAR | Status: DC | PRN
  Administered 2017-06-26: 4 mg via INTRAVENOUS

## 2017-06-26 MED ORDER — FENTANYL CITRATE (PF) 250 MCG/5ML IJ SOLN
INTRAMUSCULAR | Status: AC
Start: 2017-06-26 — End: ?
  Filled 2017-06-26: qty 5

## 2017-06-26 MED ORDER — FENTANYL CITRATE (PF) 100 MCG/2ML IJ SOLN
25.0000 ug | INTRAMUSCULAR | Status: DC | PRN
  Administered 2017-06-26 (×3): 50 ug via INTRAVENOUS

## 2017-06-26 MED ORDER — PHENYLEPHRINE HCL 10 MG/ML IJ SOLN
INTRAMUSCULAR | Status: DC | PRN
  Administered 2017-06-26 (×4): 80 ug via INTRAVENOUS

## 2017-06-26 MED ORDER — HYDROCHLOROTHIAZIDE 12.5 MG PO CAPS
12.5000 mg | ORAL_CAPSULE | Freq: Every day | ORAL | Status: DC
  Filled 2017-06-26 (×2): qty 1

## 2017-06-26 MED ORDER — SUGAMMADEX SODIUM 200 MG/2ML IV SOLN
INTRAVENOUS | Status: AC
  Filled 2017-06-26: qty 2

## 2017-06-26 MED ORDER — DEXAMETHASONE SODIUM PHOSPHATE 10 MG/ML IJ SOLN
INTRAMUSCULAR | Status: DC | PRN
  Administered 2017-06-26: 4 mg via INTRAVENOUS

## 2017-06-26 MED ORDER — BUPIVACAINE HCL (PF) 0.25 % IJ SOLN
INTRAMUSCULAR | Status: AC
  Filled 2017-06-26: qty 30

## 2017-06-26 MED ORDER — LACTATED RINGERS IR SOLN
Status: DC | PRN
  Administered 2017-06-26: 3000 mL

## 2017-06-26 MED ORDER — ONDANSETRON HCL 4 MG/2ML IJ SOLN
INTRAMUSCULAR | Status: AC
  Filled 2017-06-26: qty 2

## 2017-06-26 MED ORDER — FENTANYL CITRATE (PF) 100 MCG/2ML IJ SOLN
INTRAMUSCULAR | Status: AC
  Administered 2017-06-26: 50 ug via INTRAVENOUS
  Filled 2017-06-26: qty 2

## 2017-06-26 MED ORDER — LISINOPRIL-HYDROCHLOROTHIAZIDE 10-12.5 MG PO TABS: 1.0000 | ORAL_TABLET | Freq: Every day | ORAL | Status: DC

## 2017-06-26 MED ORDER — LIDOCAINE HCL (CARDIAC) 20 MG/ML IV SOLN
INTRAVENOUS | Status: DC | PRN
  Administered 2017-06-26: 60 mg via INTRAVENOUS

## 2017-06-26 MED ORDER — SUGAMMADEX SODIUM 200 MG/2ML IV SOLN
INTRAVENOUS | Status: DC | PRN
  Administered 2017-06-26: 200 mg via INTRAVENOUS

## 2017-06-26 MED ORDER — CEFAZOLIN SODIUM-DEXTROSE 2-3 GM-%(50ML) IV SOLR
INTRAVENOUS | Status: AC
  Filled 2017-06-26: qty 50

## 2017-06-26 MED ORDER — PANCRELIPASE (LIP-PROT-AMYL) 36000-114000 UNITS PO CPEP
72000.0000 [IU] | ORAL_CAPSULE | Freq: Three times a day (TID) | ORAL | Status: DC
  Filled 2017-06-26 (×3): qty 2

## 2017-06-26 MED ORDER — IBUPROFEN 600 MG PO TABS
600.0000 mg | ORAL_TABLET | Freq: Four times a day (QID) | ORAL | Status: DC | PRN
  Administered 2017-06-26 – 2017-06-27 (×2): 600 mg via ORAL
  Filled 2017-06-26 (×2): qty 1

## 2017-06-26 MED ORDER — OXYCODONE HCL 5 MG PO TABS: 5.0000 mg | ORAL_TABLET | Freq: Once | ORAL | Status: DC | PRN

## 2017-06-26 MED ORDER — LISINOPRIL 10 MG PO TABS
10.0000 mg | ORAL_TABLET | Freq: Every day | ORAL | Status: DC
  Filled 2017-06-26 (×2): qty 1

## 2017-06-26 MED ORDER — GLYCOPYRROLATE 0.2 MG/ML IJ SOLN
INTRAMUSCULAR | Status: AC
  Filled 2017-06-26: qty 1

## 2017-06-26 MED ORDER — BUPIVACAINE HCL (PF) 0.25 % IJ SOLN
INTRAMUSCULAR | Status: DC | PRN
  Administered 2017-06-26: 10 mL

## 2017-06-26 MED ORDER — GLYCOPYRROLATE 0.2 MG/ML IJ SOLN
INTRAMUSCULAR | Status: DC | PRN
  Administered 2017-06-26: 0.2 mg via INTRAVENOUS

## 2017-06-26 MED ORDER — ALBUTEROL SULFATE (2.5 MG/3ML) 0.083% IN NEBU: 2.5000 mg | INHALATION_SOLUTION | Freq: Four times a day (QID) | RESPIRATORY_TRACT | Status: DC | PRN

## 2017-06-26 MED ORDER — PANCRELIPASE (LIP-PROT-AMYL) 36000-114000 UNITS PO CPEP
36000.0000 [IU] | ORAL_CAPSULE | ORAL | Status: DC
  Filled 2017-06-26 (×4): qty 1

## 2017-06-26 MED ORDER — LEVOTHYROXINE SODIUM 137 MCG PO TABS
137.0000 ug | ORAL_TABLET | Freq: Every day | ORAL | Status: DC
  Administered 2017-06-27: 137 ug via ORAL
  Filled 2017-06-26: qty 1

## 2017-06-26 MED ORDER — MIDAZOLAM HCL 2 MG/2ML IJ SOLN
INTRAMUSCULAR | Status: DC | PRN
  Administered 2017-06-26: 2 mg via INTRAVENOUS

## 2017-06-26 MED ORDER — ROCURONIUM BROMIDE 100 MG/10ML IV SOLN
INTRAVENOUS | Status: DC | PRN
  Administered 2017-06-26: 10 mg via INTRAVENOUS
  Administered 2017-06-26: 50 mg via INTRAVENOUS
  Administered 2017-06-26: 10 mg via INTRAVENOUS

## 2017-06-26 MED ORDER — LIDOCAINE-EPINEPHRINE (PF) 1 %-1:200000 IJ SOLN
INTRAMUSCULAR | Status: DC | PRN
  Administered 2017-06-26: 10 mL

## 2017-06-26 MED ORDER — DEXAMETHASONE SODIUM PHOSPHATE 4 MG/ML IJ SOLN
INTRAMUSCULAR | Status: AC
  Filled 2017-06-26: qty 1

## 2017-06-26 MED ORDER — ESCITALOPRAM OXALATE 10 MG PO TABS
10.0000 mg | ORAL_TABLET | Freq: Every day | ORAL | Status: DC
  Filled 2017-06-26 (×2): qty 1

## 2017-06-26 MED ORDER — PANTOPRAZOLE SODIUM 40 MG PO TBEC
80.0000 mg | DELAYED_RELEASE_TABLET | Freq: Every day | ORAL | Status: DC
  Administered 2017-06-26: 80 mg via ORAL
  Filled 2017-06-26: qty 2

## 2017-06-26 MED ORDER — LIDOCAINE-EPINEPHRINE (PF) 1 %-1:200000 IJ SOLN
INTRAMUSCULAR | Status: AC
  Filled 2017-06-26: qty 30

## 2017-06-26 MED ORDER — PROPOFOL 10 MG/ML IV BOLUS
INTRAVENOUS | Status: AC
  Filled 2017-06-26: qty 20

## 2017-06-26 MED ORDER — SCOPOLAMINE 1 MG/3DAYS TD PT72
MEDICATED_PATCH | TRANSDERMAL | Status: AC
  Administered 2017-06-26: 1.5 mg via TRANSDERMAL
  Filled 2017-06-26: qty 1

## 2017-06-26 MED ORDER — MIDAZOLAM HCL 2 MG/2ML IJ SOLN
INTRAMUSCULAR | Status: AC
  Filled 2017-06-26: qty 2

## 2017-06-26 MED ORDER — MONTELUKAST SODIUM 10 MG PO TABS
10.0000 mg | ORAL_TABLET | Freq: Every day | ORAL | Status: DC
  Filled 2017-06-26: qty 1

## 2017-06-26 MED ORDER — OXYCODONE-ACETAMINOPHEN 5-325 MG PO TABS
1.0000 | ORAL_TABLET | ORAL | Status: DC | PRN
  Administered 2017-06-26 – 2017-06-27 (×2): 1 via ORAL
  Filled 2017-06-26 (×2): qty 1

## 2017-06-26 MED ORDER — PROPOFOL 10 MG/ML IV BOLUS
INTRAVENOUS | Status: DC | PRN
  Administered 2017-06-26: 160 mg via INTRAVENOUS

## 2017-06-26 MED ORDER — LACTATED RINGERS IV SOLN: INTRAVENOUS | Status: DC

## 2017-06-26 MED ORDER — ONDANSETRON HCL 4 MG/2ML IJ SOLN: 4.0000 mg | Freq: Once | INTRAMUSCULAR | Status: DC | PRN

## 2017-06-26 MED ORDER — SCOPOLAMINE 1 MG/3DAYS TD PT72
1.0000 | MEDICATED_PATCH | Freq: Once | TRANSDERMAL | Status: DC
  Administered 2017-06-26: 1.5 mg via TRANSDERMAL

## 2017-06-26 MED ORDER — LIDOCAINE HCL (CARDIAC) 20 MG/ML IV SOLN
INTRAVENOUS | Status: AC
  Filled 2017-06-26: qty 5

## 2017-06-26 MED ORDER — OXYCODONE HCL 5 MG/5ML PO SOLN: 5.0000 mg | Freq: Once | ORAL | Status: DC | PRN

## 2017-06-26 SURGICAL SUPPLY — 47 items
APPLICATOR COTTON TIP 6IN STRL (MISCELLANEOUS) IMPLANT
CABLE HIGH FREQUENCY MONO STRZ (ELECTRODE) IMPLANT
CATH ROBINSON RED A/P 16FR (CATHETERS) IMPLANT
CLOTH BEACON ORANGE TIMEOUT ST (SAFETY) ×2 IMPLANT
CONT PATH 16OZ SNAP LID 3702 (MISCELLANEOUS) ×2 IMPLANT
COVER BACK TABLE 60X90IN (DRAPES) ×2 IMPLANT
DECANTER SPIKE VIAL GLASS SM (MISCELLANEOUS) IMPLANT
DERMABOND ADVANCED (GAUZE/BANDAGES/DRESSINGS) ×1
DERMABOND ADVANCED .7 DNX12 (GAUZE/BANDAGES/DRESSINGS) ×1 IMPLANT
DRSG OPSITE POSTOP 3X4 (GAUZE/BANDAGES/DRESSINGS) ×2 IMPLANT
DURAPREP 26ML APPLICATOR (WOUND CARE) ×2 IMPLANT
ELECT REM PT RETURN 9FT ADLT (ELECTROSURGICAL) ×2
ELECTRODE REM PT RTRN 9FT ADLT (ELECTROSURGICAL) ×1 IMPLANT
FILTER SMOKE EVAC LAPAROSHD (FILTER) IMPLANT
GAUZE PACKING IODOFORM 1X5 (MISCELLANEOUS) IMPLANT
GLOVE BIO SURGEON STRL SZ 6.5 (GLOVE) ×4 IMPLANT
GLOVE BIOGEL PI IND STRL 7.0 (GLOVE) ×4 IMPLANT
GLOVE BIOGEL PI INDICATOR 7.0 (GLOVE) ×4
LEGGING LITHOTOMY PAIR STRL (DRAPES) ×2 IMPLANT
NEEDLE MAYO CATGUT SZ4 (NEEDLE) ×2 IMPLANT
PACK LAVH (CUSTOM PROCEDURE TRAY) ×2 IMPLANT
PACK ROBOTIC GOWN (GOWN DISPOSABLE) ×2 IMPLANT
PACK TRENDGUARD 450 HYBRID PRO (MISCELLANEOUS) ×1 IMPLANT
PACK TRENDGUARD 600 HYBRD PROC (MISCELLANEOUS) IMPLANT
PROTECTOR NERVE ULNAR (MISCELLANEOUS) ×4 IMPLANT
SCISSORS LAP 5X35 DISP (ENDOMECHANICALS) ×2 IMPLANT
SEALER TISSUE G2 CVD JAW 35 (ENDOMECHANICALS) IMPLANT
SEALER TISSUE G2 CVD JAW 45CM (ENDOMECHANICALS)
SET IRRIG TUBING LAPAROSCOPIC (IRRIGATION / IRRIGATOR) ×2 IMPLANT
SHEARS HARMONIC ACE PLUS 36CM (ENDOMECHANICALS) ×2 IMPLANT
SLEEVE XCEL OPT CAN 5 100 (ENDOMECHANICALS) ×2 IMPLANT
SOLUTION ELECTROLUBE (MISCELLANEOUS) ×2 IMPLANT
SUT CHROMIC 0 CT 1 (SUTURE) IMPLANT
SUT MNCRL AB 4-0 PS2 18 (SUTURE) ×2 IMPLANT
SUT VIC AB 0 CT1 18XCR BRD8 (SUTURE) ×3 IMPLANT
SUT VIC AB 0 CT1 27 (SUTURE) ×1
SUT VIC AB 0 CT1 27XBRD ANBCTR (SUTURE) ×1 IMPLANT
SUT VIC AB 0 CT1 8-18 (SUTURE) ×3
SUT VICRYL 0 TIES 12 18 (SUTURE) ×2 IMPLANT
SUT VICRYL 0 UR6 27IN ABS (SUTURE) ×4 IMPLANT
TOWEL OR 17X24 6PK STRL BLUE (TOWEL DISPOSABLE) ×4 IMPLANT
TRAY FOLEY CATH SILVER 14FR (SET/KITS/TRAYS/PACK) ×2 IMPLANT
TRENDGUARD 450 HYBRID PRO PACK (MISCELLANEOUS) ×2
TRENDGUARD 600 HYBRID PROC PK (MISCELLANEOUS)
TROCAR BALLN 12MMX100 BLUNT (TROCAR) ×2 IMPLANT
TROCAR XCEL NON-BLD 5MMX100MML (ENDOMECHANICALS) ×2 IMPLANT
WARMER LAPAROSCOPE (MISCELLANEOUS) ×2 IMPLANT

## 2017-06-26 NOTE — Anesthesia Preprocedure Evaluation (Signed)
Anesthesia Evaluation  Patient identified by MRN, date of birth, ID band Patient awake    Reviewed: Allergy & Precautions, NPO status , Patient's Chart, lab work & pertinent test results  Airway Mallampati: I       Dental no notable dental hx. (+) Teeth Intact   Pulmonary    Pulmonary exam normal breath sounds clear to auscultation       Cardiovascular hypertension, Normal cardiovascular exam Rhythm:Regular Rate:Normal     Neuro/Psych    GI/Hepatic GERD  Medicated and Controlled,  Endo/Other  Hypothyroidism   Renal/GU      Musculoskeletal   Abdominal (+) + obese,   Peds  Hematology   Anesthesia Other Findings   Reproductive/Obstetrics negative OB ROS                             Anesthesia Physical Anesthesia Plan  ASA: II  Anesthesia Plan: General   Post-op Pain Management:    Induction:   PONV Risk Score and Plan: 4 or greater and Ondansetron, Dexamethasone, Midazolam and Scopolamine patch - Pre-op  Airway Management Planned: Oral ETT  Additional Equipment:   Intra-op Plan:   Post-operative Plan: Extubation in OR  Informed Consent: I have reviewed the patients History and Physical, chart, labs and discussed the procedure including the risks, benefits and alternatives for the proposed anesthesia with the patient or authorized representative who has indicated his/her understanding and acceptance.   Dental advisory given  Plan Discussed with: CRNA and Surgeon  Anesthesia Plan Comments:         Anesthesia Quick Evaluation

## 2017-06-26 NOTE — H&P (Signed)
Julia Green is an 46 y.o. female. G68P4 Married, husband present.  Visit conducted in Spanish  RP: Chronic pelvic pain for LAVH/Bilateral Salpingectomy  HPI:  Received 2 doses of DepoProvera with control of Menometrorrhagia, but continued pelvic pain/discomfort none-the-less.  Requests Hysterectomy.  Pertinent Gynecological History: Menses: Menometrorrhagia Contraception: Depo-Provera injections Blood transfusions: none Sexually transmitted diseases: no past history Previous GYN Procedures: None  Last pap: normal OB History: G4P4   Menstrual History:  No LMP recorded.    Past Medical History:  Diagnosis Date  . Arthritis   . Asthma   . Depression   . Fatty liver   . Gallstones   . GERD (gastroesophageal reflux disease)   . Headache   . History of low potassium   . Hypertension   . Hypothyroidism   . Right arm numbness   . Right leg numbness   . Sciatic nerve pain   . Thyroid disease     Past Surgical History:  Procedure Laterality Date  . COLONOSCOPY    . UPPER GI ENDOSCOPY      Family History  Problem Relation Age of Onset  . Hypertension Mother   . Uterine cancer Mother   . Cancer Sister        unknown  . Stomach cancer Maternal Aunt   . Colon cancer Neg Hx   . Esophageal cancer Neg Hx   . Rectal cancer Neg Hx     Social History:  reports that  has never smoked. she has never used smokeless tobacco. She reports that she does not drink alcohol or use drugs.  Allergies: No Known Allergies  Facility-Administered Medications Prior to Admission  Medication Dose Route Frequency Provider Last Rate Last Dose  . 0.9 %  sodium chloride infusion  500 mL Intravenous Continuous Nelida Meuse III, MD       Medications Prior to Admission  Medication Sig Dispense Refill Last Dose  . albuterol (PROVENTIL HFA;VENTOLIN HFA) 108 (90 Base) MCG/ACT inhaler Inhale 1-2 puffs into the lungs every 6 (six) hours as needed for wheezing or shortness of breath.    06/26/2017 at 0500  . aspirin EC 81 MG tablet Take 81 mg by mouth daily.   Past Week at Unknown time  . butalbital-acetaminophen-caffeine (FIORICET, ESGIC) 50-325-40 MG tablet Take 2 tablets by mouth every 6 (six) hours as needed for headache or migraine.   06/25/2017 at Unknown time  . escitalopram (LEXAPRO) 10 MG tablet Take 10 mg by mouth daily.   Past Week at Unknown time  . levothyroxine (SYNTHROID, LEVOTHROID) 137 MCG tablet Take 1 tablet (137 mcg total) by mouth daily before breakfast. 30 tablet 12 06/26/2017 at 0400  . lisinopril-hydrochlorothiazide (PRINZIDE,ZESTORETIC) 10-12.5 MG tablet Take 1 tablet by mouth daily.   06/25/2017 at 0500  . loratadine (CLARITIN) 10 MG tablet loratadine 10 mg tablet  TAKE 1 TABLET BY MOUTH IN THE MORNING   Past Week at Unknown time  . meclizine (ANTIVERT) 25 MG tablet Take 25 mg by mouth every 6 (six) hours as needed for dizziness.   Past Week at Unknown time  . montelukast (SINGULAIR) 10 MG tablet Take 10 mg by mouth at bedtime.   Past Week at Unknown time  . Multiple Vitamin (MULTI-VITAMIN DAILY PO) Take 1 tablet by mouth daily.   Past Week at Unknown time  . omeprazole (PRILOSEC) 40 MG capsule Take 1 capsule (40 mg total) by mouth daily. 30 capsule 2 06/26/2017 at 0400  . vitamin C (ASCORBIC  ACID) 500 MG tablet Take 500 mg by mouth daily.   Past Week at Unknown time  . lipase/protease/amylase (CREON) 36000 UNITS CPEP capsule 36,000 unit capsules. Take 2 with each meal and 1 with snacks 240 capsule 3 Taking  . tranexamic acid (LYSTEDA) 650 MG TABS tablet Take 2 tablets by mouth daily three times daily for 5 days to help with bleeding 30 tablet 0 Taking    REVIEW OF SYSTEMS: A ROS was performed and pertinent positives and negatives are included in the history.  GENERAL: No fevers or chills. HEENT: No change in vision, no earache, sore throat or sinus congestion. NECK: No pain or stiffness. CARDIOVASCULAR: No chest pain or pressure. No palpitations. PULMONARY:  No shortness of breath, cough or wheeze. GASTROINTESTINAL: No abdominal pain, nausea, vomiting or diarrhea, melena or bright red blood per rectum. GENITOURINARY: No urinary frequency, urgency, hesitancy or dysuria. MUSCULOSKELETAL: No joint or muscle pain, no back pain, no recent trauma. DERMATOLOGIC: No rash, no itching, no lesions. ENDOCRINE: No polyuria, polydipsia, no heat or cold intolerance. No recent change in weight. HEMATOLOGICAL: No anemia or easy bruising or bleeding. NEUROLOGIC: No headache, seizures, numbness, tingling or weakness. PSYCHIATRIC: No depression, no loss of interest in normal activity or change in sleep pattern.     Blood pressure 125/79, pulse (!) 58, temperature 98.5 F (36.9 C), temperature source Oral, resp. rate 16, SpO2 100 %.  Physical Exam:  See office notes  Results for orders placed or performed during the hospital encounter of 06/26/17 (from the past 24 hour(s))  Pregnancy, urine     Status: None   Collection Time: 06/26/17  6:00 AM  Result Value Ref Range   Preg Test, Ur NEGATIVE NEGATIVE   Assessment and Plan:  Pelvic pain in female Chronic pelvic pain suspicious for Endometriosis/Adenomyosis.  46 yo G4P4, no desire to preserve Fertility.  At patient's request, decision to proceed with an LAVH/Bilateral Salpingectomy instead of a Dx Laparoscopy.  Surgery and risks thoroughly discussed including trauma to bowels, ureters, Blood Vessels, risks of infection, risks of DVT/PE, risks of persistent pain not improved by surgery.                        Patient was counseled as to the risk of surgery to include the following:  1. Infection (prohylactic antibiotics will be administered)  2. DVT/Pulmonary Embolism (prophylactic pneumo compression stockings will be used)  3.Trauma to internal organs requiring additional surgical procedure to repair any injury to     Internal organs requiring perhaps additional hospitalization days.  4.Hemmorhage requiring  transfusion and blood products which carry risks such as anaphylactic reaction, hepatitis and AIDS  Patient had received literature information on the procedure scheduled and all her questions were answered and fully accepts all risk.     Marie-Lyne Avy Barlett 06/26/2017, 7:02 AM

## 2017-06-26 NOTE — Op Note (Signed)
Operative Note  06/26/2017  10:21 AM  PATIENT:  Merla Riches  46 y.o. female  PRE-OPERATIVE DIAGNOSIS:  Menometrorrhagia and Dysmenorrhea  POST-OPERATIVE DIAGNOSIS:  Menometrorrhagia and Dysmenorrhea.    PROCEDURE:  Procedure(s): LAPAROSCOPIC ASSISTED VAGINAL HYSTERECTOMY WITH BILATERAL SALPINGECTOMY, LYSIS OF ADHESION  SURGEON:  Surgeon(s): Princess Bruins, MD Fontaine, Belinda Block, MD  ANESTHESIA:   general  FINDINGS:  Normal size Uterus with bilateral Tubes s/p Tubal Ligation.  Bilateral Ovaries normal.  Left sigmoid adhesion.  DESCRIPTION OF OPERATION: Under general anesthesia with endotracheal intubation the patient is in lithotomy position.  She is prepped with DuraPrep on the abdomen and with Betadine on the suprapubic, vulvar and vaginal areas.  She is draped as usual.  Timeout is done.  The Foley is inserted in the bladder.  The uterine cannula is put in place for uterine manipulation.  Abdominally we infiltrated the infraumbilical area with Marcaine one quarter plain.  We make a 1.5 cm incision at that level with the scalpel.  The aponeurosis is grasped with Coker's.  The aponeurosis is cut with Mayo scissors.  The parietal peritoneum is opened bluntly with the finger.  A pursestring stitch of Vicryl 0 was done on the aponeurosis.  The Sheryle Hail is inserted under direct vision and pneumoperitoneum was created with CO2.  The camera was entered at that level.  Inspection of the abdominal and pelvic cavities.  No pathology is seen in the abdomen.  In the pelvis we note a normal size uterus normal appearance.  Both ovaries are normal in size and appearance.  Both tubes are status post tubal ligation.  Adhesion of the sigmoid close to the uterosacral ligament on the left side.  Infiltration of the subcutaneous tissue with Marcaine one quarter plain on the right and left side.  5 mm incisions at those locations with the scalpel.  Insertion of a 5 mm port on the right side under  direct vision and insertion of a 5 mm port on the left side under direct vision.  We used the harmonic scalpel for cauterization and section of the pedicles.  Both ureters are seen in normal anatomic position with good peristalsis.  We start on the right side with cauterization and section of the right mesosalpinx.  We then cauterized and section the right round ligament.  We cauterized and sectioned the right utero-ovarian ligament.  We then open the visceral peritoneum anteriorly all the way to the midline.  We proceeded exactly the same way on the left side.  The adhesion of the sigmoid close to the left uterosacral ligament is descended to a location where it will not interfere with surgery.  The distal ends of the tubes are put in the posterior cul-de-sac.  Hemostasis is adequate at all levels.  We go to the vaginal time.      The patient is repositioned for vaginal surgery with the legs up.  The weighted speculum was inserted in the vagina and the cervix is grasped with 2 tenaculums.  The junction of the vagina with the cervix is infiltrated with lidocaine 1% a total of 10 cc all around.  We used a scalpel to make a circumferential incision at that level.  We then used a sponge on the finger to push the vaginal mucosa anteriorly.  The visceral peritoneum is open at that level with metastases scissors.  The retractor is put at that opening to retract the bladder up.  We then go posteriorly and opened the visceral peritoneum at  that level.  Both tubes that were left in the posterior cul-de-sac are suctioned out.  We placed the weighted speculum with a long narrow weighted speculum.  We then used a curved Heaney on the left side to clamp the uterosacral ligaments and the cardinal ligaments.  We sectioned with Mayo scissors and suture with a Vicryl 0.  We do another stitch on the left side to completely section the uterosacral ligament.  Those 2 stitches are kept on a hemostat.  We proceeded the same way on the  right side.  We then clamped the left uterine artery with a curved Heaney we suction with Mayo scissors and suture with a Vicryl 0.  We proceed the same way on the right side.  We then take 1 more bite on either side with a curved Heaney section with Mayo scissors and suture with Vicryl 0.  We then flipped the uterus to see the small pedicle left which we clamped with a curved Heaney on the left side section with Mayo scissors and suture with a free tie of Vicryl 0.  The uterus is completely freed on that side.  We proceeded the same way on the right, clamping the pedicle with a curved Heaney, sectioning with Mayo scissors and suturing with a free tie of Vicryl 0.  The uterus with cervix are sent to pathology together with both tubes.  All pedicles are verified for good hemostasis with irrigation and suction.  Good hemostasis was confirmed on all pedicles.  Hemostasis is completed with the Bovie at the vaginal cuff.  We then proceeded with a locked running suture of Vicryl 0 on the posterior calf.  Including the uterosacral ligaments of each side for suspension.  We then closed the vaginal cuff with figure-of-eight of Vicryl 0.  Hemostasis is adequate.  We then repositioned the patient for laparoscopy time.      We re-create the pneumoperitoneum with CO2.  We irrigated and suctioned the pelvic cavity.  Hemostasis is completed on the level of the right ovary with the bipolar.  Hemostasis is adequate at all levels.  Pictures are taken.  All laparoscopic instruments were removed.  The ports are removed under direct vision.  The CO2 is evacuated.  We closed the infraumbilical incision by attaching the pursestring stitch of the aponeurosis, but the suture broke.  We therefore closed the aponeurosis with a running suture of Vicryl 0.  We put one stitch of Monocryl 4-0 on the adipose tissue.  The skin was closed with a subcuticular suture of Vicryl 4-0.  The 5 mm incisions were closed with a single stitch of Vicryl 4-0.   Dermabond was added on the 3 incisions.  Hemostasis was adequate vaginally.  The patient was brought to recovery room in good and stable status.  ESTIMATED BLOOD LOSS: 200 mL   Intake/Output Summary (Last 24 hours) at 06/26/2017 1021 Last data filed at 06/26/2017 1008 Gross per 24 hour  Intake 2300 ml  Output 500 ml  Net 1800 ml     BLOOD ADMINISTERED:none   LOCAL MEDICATIONS USED:  MARCAINE     SPECIMEN:  Source of Specimen:  Uterus with Cervix and both tubes  DISPOSITION OF SPECIMEN:  PATHOLOGY  COUNTS:  YES  PLAN OF CARE: Transfer to PACU  Marie-Lyne LavoieMD10:21 AM

## 2017-06-26 NOTE — Plan of Care (Signed)
Pt education will continue throughout stay

## 2017-06-26 NOTE — Anesthesia Postprocedure Evaluation (Signed)
Anesthesia Post Note  Patient: Julia Green  Procedure(s) Performed: LAPAROSCOPIC ASSISTED VAGINAL HYSTERECTOMY WITH SALPINGECTOMY (Bilateral )     Patient location during evaluation: PACU Anesthesia Type: General Level of consciousness: awake and sedated Pain management: pain level controlled Vital Signs Assessment: post-procedure vital signs reviewed and stable Respiratory status: spontaneous breathing Cardiovascular status: stable Postop Assessment: no apparent nausea or vomiting Anesthetic complications: no    Last Vitals:  Vitals:   06/26/17 1120 06/26/17 1130  BP:    Pulse: 62 62  Resp: 16   Temp:    SpO2: 100% 100%    Last Pain:  Vitals:   06/26/17 1113  TempSrc: Oral   Pain Goal: Patients Stated Pain Goal: 4 (06/26/17 6060)               Monette Omara JR,JOHN Mateo Flow

## 2017-06-26 NOTE — Addendum Note (Signed)
Addendum  created 06/26/17 1645 by Asher Muir, CRNA   Sign clinical note

## 2017-06-26 NOTE — Anesthesia Postprocedure Evaluation (Signed)
Anesthesia Post Note  Patient: Julia Green  Procedure(s) Performed: LAPAROSCOPIC ASSISTED VAGINAL HYSTERECTOMY WITH SALPINGECTOMY (Bilateral )     Patient location during evaluation: [facility] Anesthesia Type: General Level of consciousness: awake Pain management: satisfactory to patient Vital Signs Assessment: post-procedure vital signs reviewed and stable Respiratory status: spontaneous breathing Cardiovascular status: stable Anesthetic complications: no    Last Vitals:  Vitals:   06/26/17 1254 06/26/17 1557  BP: 97/64 128/78  Pulse: 64 81  Resp: 14 15  Temp: 36.6 C 36.9 C  SpO2: 96% 100%    Last Pain:  Vitals:   06/26/17 1557  TempSrc: Oral  PainSc:    Pain Goal: Patients Stated Pain Goal: 4 (06/26/17 1500)               Casimer Lanius

## 2017-06-26 NOTE — Anesthesia Procedure Notes (Signed)
Procedure Name: Intubation Date/Time: 06/26/2017 7:38 AM Performed by: Jonna Munro, CRNA Pre-anesthesia Checklist: Patient identified, Emergency Drugs available, Suction available, Patient being monitored and Timeout performed Patient Re-evaluated:Patient Re-evaluated prior to induction Oxygen Delivery Method: Circle system utilized Preoxygenation: Pre-oxygenation with 100% oxygen Induction Type: IV induction Ventilation: Mask ventilation without difficulty Laryngoscope Size: Mac and 3 Grade View: Grade I Tube type: Oral Tube size: 7.0 mm Number of attempts: 1 Airway Equipment and Method: Stylet Placement Confirmation: ETT inserted through vocal cords under direct vision,  positive ETCO2 and breath sounds checked- equal and bilateral Secured at: 21 cm Tube secured with: Tape Dental Injury: Teeth and Oropharynx as per pre-operative assessment

## 2017-06-26 NOTE — Progress Notes (Signed)
Check on patient need , ordered her meals, by Juliann Mule Spanish interpreter.

## 2017-06-26 NOTE — Transfer of Care (Signed)
Immediate Anesthesia Transfer of Care Note  Patient: Julia Green  Procedure(s) Performed: LAPAROSCOPIC ASSISTED VAGINAL HYSTERECTOMY WITH SALPINGECTOMY (Bilateral )  Patient Location: PACU  Anesthesia Type:General  Level of Consciousness: awake, alert  and oriented  Airway & Oxygen Therapy: Patient Spontanous Breathing and Patient connected to nasal cannula oxygen  Post-op Assessment: Report given to RN and Post -op Vital signs reviewed and stable  Post vital signs: Reviewed and stable  Last Vitals:  Vitals:   06/26/17 0632  BP: 125/79  Pulse: (!) 58  Resp: 16  Temp: 36.9 C  SpO2: 100%    Last Pain:  Vitals:   06/26/17 0632  TempSrc: Oral      Patients Stated Pain Goal: 4 (57/84/69 6295)  Complications: No apparent anesthesia complications

## 2017-06-27 ENCOUNTER — Telehealth: Payer: Self-pay

## 2017-06-27 ENCOUNTER — Encounter (HOSPITAL_COMMUNITY): Payer: Self-pay | Admitting: Obstetrics & Gynecology

## 2017-06-27 DIAGNOSIS — N921 Excessive and frequent menstruation with irregular cycle: Secondary | ICD-10-CM | POA: Diagnosis not present

## 2017-06-27 LAB — CBC
HCT: 28.5 % — ABNORMAL LOW (ref 36.0–46.0)
Hemoglobin: 10 g/dL — ABNORMAL LOW (ref 12.0–15.0)
MCH: 31.2 pg (ref 26.0–34.0)
MCHC: 35.1 g/dL (ref 30.0–36.0)
MCV: 88.8 fL (ref 78.0–100.0)
PLATELETS: 233 10*3/uL (ref 150–400)
RBC: 3.21 MIL/uL — AB (ref 3.87–5.11)
RDW: 13.9 % (ref 11.5–15.5)
WBC: 12.4 10*3/uL — AB (ref 4.0–10.5)

## 2017-06-27 MED ORDER — OXYCODONE-ACETAMINOPHEN 7.5-325 MG PO TABS: 1.0000 | ORAL_TABLET | Freq: Four times a day (QID) | ORAL | 0 refills | Status: DC | PRN

## 2017-06-27 NOTE — Telephone Encounter (Signed)
I received a call from [employer] today wanting to confirm patient had her surgery yesterday, CPT code , etc.  I  Went to check to be sure med rec release form on file but it was not in Environmental health practitioner. I had filled out FMLA forms to [employer] previously and would not have sent those without release form. Rosemarie Ax called patient and she confirmed she did fill out med rec release form with Claudia's assistance. However, it is not in her chart. I called [employer] to see if they had one on file but they did not. I advised them of our situation and that we will be delayed responding as we make arrangements with patient to have her fill out a release form. Rosemarie Ax will mail her a form to complete and send back to [place].

## 2017-06-27 NOTE — Progress Notes (Signed)
Pt denies/ques concerns re: d/c instructions.  Pt desires to have prilosec prescription refilled, will notify MD.  Pt d/c home in stable condition via w/c with husband.

## 2017-06-27 NOTE — Discharge Summary (Signed)
Physician Discharge Summary  Patient ID: Julia Green MRN: [id number] DOB/AGE: 44/14/1972 44 y.o.  Admit date: 06/26/2017 Discharge date: 06/27/2017  Admission Diagnoses: PELVIC PAIN   Discharge Diagnoses: Same Active Problems:   Postoperative state   Discharged Condition: good  Consults:None  Significant Diagnostic Studies: None  Treatments:surgery: Laparoscopy Assisted Vaginal Hysterectomy with Bilateral Salpingectomy  Vitals:   06/27/17 0015 06/27/17 0410  BP: (!) 102/54 104/60  Pulse: 65 66  Resp: 18 17  Temp: 98.7 F (37.1 C) 98.5 F (36.9 C)  SpO2: 99% 98%     Hospital Course: Good    Discharge Exam: Normal  Disposition: 01-Home or Self Care     Allergies as of 06/27/2017   No Known Allergies     Medication List    STOP taking these medications   tranexamic acid 650 MG Tabs tablet Commonly known as:  LYSTEDA     TAKE these medications   albuterol 108 (90 Base) MCG/ACT inhaler Commonly known as:  PROVENTIL HFA;VENTOLIN HFA Inhale 1-2 puffs into the lungs every 6 (six) hours as needed for wheezing or shortness of breath.   aspirin EC 81 MG tablet Take 81 mg by mouth daily.   butalbital-acetaminophen-caffeine 50-325-40 MG tablet Commonly known as:  FIORICET, ESGIC Take 2 tablets by mouth every 6 (six) hours as needed for headache or migraine.   escitalopram 10 MG tablet Commonly known as:  LEXAPRO Take 10 mg by mouth daily.   levothyroxine 137 MCG tablet Commonly known as:  SYNTHROID, LEVOTHROID Take 1 tablet (137 mcg total) by mouth daily before breakfast.   lipase/protease/amylase 36000 UNITS Cpep capsule Commonly known as:  CREON 36,000 unit capsules. Take 2 with each meal and 1 with snacks   lisinopril-hydrochlorothiazide 10-12.5 MG tablet Commonly known as:  PRINZIDE,ZESTORETIC Take 1 tablet by mouth daily.   loratadine 10 MG tablet Commonly known as:  CLARITIN loratadine 10 mg tablet  TAKE 1 TABLET BY MOUTH IN THE  MORNING   meclizine 25 MG tablet Commonly known as:  ANTIVERT Take 25 mg by mouth every 6 (six) hours as needed for dizziness.   montelukast 10 MG tablet Commonly known as:  SINGULAIR Take 10 mg by mouth at bedtime.   MULTI-VITAMIN DAILY PO Take 1 tablet by mouth daily.   omeprazole 40 MG capsule Commonly known as:  PRILOSEC Take 1 capsule (40 mg total) by mouth daily.   oxyCODONE-acetaminophen 7.5-325 MG tablet Commonly known as:  PERCOCET Take 1 tablet every 6 (six) hours as needed by mouth for severe pain.   vitamin C 500 MG tablet Commonly known as:  ASCORBIC ACID Take 500 mg by mouth daily.        Follow-up Information    Princess Bruins, MD Follow up in 3 week(s).   Specialty:  Obstetrics and Gynecology Contact information: [address] [place] [address] [phone]            Signed: Princess Bruins 06/27/2017, 7:49 AM

## 2017-06-27 NOTE — Progress Notes (Signed)
POD #1 Subjective: Patient reports tolerating PO and no problems voiding.    Objective: I have reviewed patient's vital signs, I/O, Labs  Vitals:   06/27/17 0015 06/27/17 0410  BP: (!) 102/54 104/60  Pulse: 65 66  Resp: 18 17  Temp: 98.7 F (37.1 C) 98.5 F (36.9 C)  SpO2: 99% 98%   I/O last 3 completed shifts: In: 2600 [I.V.:2600] Out: 1975 [EPPIR:5188; Blood:200] Total I/O In: -  Out: 4166 [Urine:1650]  Results for orders placed or performed during the hospital encounter of 06/26/17 (from the past 24 hour(s))  CBC     Status: Abnormal (Preliminary result)   Collection Time: 06/27/17  5:47 AM  Result Value Ref Range   WBC PENDING 4.0 - 10.5 K/uL   RBC 3.21 (L) 3.87 - 5.11 MIL/uL   Hemoglobin 10.0 (L) 12.0 - 15.0 g/dL   HCT 28.5 (L) 36.0 - 46.0 %   MCV 88.8 78.0 - 100.0 fL   MCH 31.2 26.0 - 34.0 pg   MCHC 35.1 30.0 - 36.0 g/dL   RDW 13.9 11.5 - 15.5 %   Platelets 233 150 - 400 K/uL    EXAM General: alert and cooperative Resp: clear to auscultation bilaterally Cardio: regular rate and rhythm GI: soft, non-tender; bowel sounds normal; no masses,  no organomegaly and incision: clean, dry and intact Extremities: no edema, redness or tenderness in the calves or thighs Vaginal Bleeding: none  Assessment: s/p Procedure(s): LAPAROSCOPIC ASSISTED VAGINAL HYSTERECTOMY WITH SALPINGECTOMY: stable and progressing well  Plan: Advance diet Encourage ambulation Discontinue IV fluids Discharge home  LOS: 0 days    Princess Bruins, MD 06/27/2017 6:37 AM    06/27/2017, 6:37 AM

## 2017-06-27 NOTE — Discharge Instructions (Signed)
Histerectomía vaginal asistida por laparoscopía, cuidados posteriores °(Laparoscopically Assisted Vaginal Hysterectomy, Care After) °Siga estas instrucciones durante las próximas semanas. Estas indicaciones le proporcionan información acerca de cómo deberá cuidarse después del procedimiento. El médico también podrá darle instrucciones más específicas. El tratamiento se ha planificado de acuerdo a las prácticas médicas actuales, pero a veces se producen problemas. Comuníquese con el médico si tiene algún problema o tiene dudas después del procedimiento. °QUÉ ESPERAR DESPUÉS DEL PROCEDIMIENTO °Después del procedimiento, es típico tener los siguientes síntomas: °· Dolor abdominal. Le darán analgésicos para controlar el dolor. °· Podrá sentir dolor de garganta por el tubo del respirador que le colocaron durante la cirugía. °INSTRUCCIONES PARA EL CUIDADO EN EL HOGAR °· Utilice los medicamentos de venta libre o recetados para calmar el dolor, el malestar o la fiebre, según se lo indique el médico. °· No tome aspirina. Puede ocasionar hemorragias. °· No conduzca mientras esté tomando analgésicos. °· Siga las indicaciones de su médico con respecto a la dieta, la actividad física, levantar objetos, conducir y para las actividades en general. °· Reanude su dieta habitual, según las indicaciones y los permisos. °· Descanse y duerma lo suficiente. °· Nose haga duchas vaginales, no utilice tampones ni tenga relaciones sexuales durante al menos 6 semanas o hasta que el profesional la autorice. °· Cambie los apósitos (vendajes) tal como le indicó su médico. °· Controle su temperatura y notifique a su médico si tiene fiebre. °· Tome duchas en lugar de baños durante 2 a 3 semanas. °· No beba alcohol hasta que el médico la autorice. °· Si está estreñida, tome un laxante suave, si el médico la autoriza. Los alimentos que contienen salvado la ayudarán para el problema de la estreñimiento. Debe ingerir la cantidad de líquidos  suficientes para mantener la orina de tono claro o color amarillo pálido. °· Trate de que alguien la acompañe en su casa durante 1 o 2 semanas, para ayudarla con los quehaceres domésticos. °· Cumpla con todas las visitas de control, según le indique su médico. ° °SOLICITE ATENCIÓN MÉDICA SI: °· Observa enrojecimiento, hinchazón o siente dolor cada vez más intenso en los sitios de las incisiones. °· Tiene pus en el sitio de la incisión. °· Advierte un olor feo que proviene de la incisión. °· La incisión se abre. °· Se siente mareada o sufre un desmayo. °· Siente dolor o tiene una hemorragia al orinar. °· Tiene diarrea persistente. °· Tiene náuseas o vómitos persistentes. °· Tiene flujo vaginal anormal. °· Tiene una erupción cutánea. °· Tiene alguna reacción anormal o aparece una alergia por los medicamentos. °· El dolor no se alivia con los medicamentos recetados. ° °SOLICITE ATENCIÓN MÉDICA DE INMEDIATO SI: °· Tiene fiebre. °· Siente un dolor abdominal intenso. °· Siente dolor en el pecho. °· Le falta el aire. °· Se desmaya. °· Siente dolor, u observa hinchazón o enrojecimiento en la pierna. °· Tiene una hemorragia vaginal abundante, con coágulos sanguíneos. ° °ASEGÚRESE DE QUE: °· Comprende estas instrucciones. °· Controlará su afección. °· Recibirá ayuda de inmediato si no mejora o si empeora. ° °Esta información no tiene como fin reemplazar el consejo del médico. Asegúrese de hacerle al médico cualquier pregunta que tenga. °Document Released: 07/28/2011 Document Revised: 08/13/2013 Document Reviewed: 02/21/2013 °Elsevier Interactive Patient Education © 2017 Elsevier Inc. ° °

## 2017-07-06 NOTE — Telephone Encounter (Signed)
Patient came by and spoke with Rosemarie Ax and completed med rec release form.   I then contacted [employer] and confirmed her surgery date and surgery performed.

## 2017-07-18 ENCOUNTER — Encounter: Payer: Self-pay | Admitting: Anesthesiology

## 2017-07-20 ENCOUNTER — Ambulatory Visit (INDEPENDENT_AMBULATORY_CARE_PROVIDER_SITE_OTHER): Payer: 59 | Admitting: Obstetrics & Gynecology

## 2017-07-20 ENCOUNTER — Encounter: Payer: Self-pay | Admitting: Obstetrics & Gynecology

## 2017-07-20 VITALS — BP 126/84

## 2017-07-20 DIAGNOSIS — R300 Dysuria: Secondary | ICD-10-CM

## 2017-07-20 DIAGNOSIS — Z09 Encounter for follow-up examination after completed treatment for conditions other than malignant neoplasm: Secondary | ICD-10-CM

## 2017-07-20 NOTE — Patient Instructions (Signed)
1. Follow-up examination after gynecological surgery Very good postop evolution with good healing.  Will follow up at 3 weeks to recheck vaginal vault.  Planning to resume work at 6 weeks postop.  Will resume sexual activity at 8 weeks postop.  2. Dysuria Urine analysis done, within normal limits.  Pending urine culture.  Will await results.  Nayla, voy a verla de nuevo en 3 semanas.

## 2017-07-20 NOTE — Progress Notes (Signed)
    Julia Green 1971/06/11 [id number]        46 y.o.  G4P4   RP:  3 wks postop LAVH/Bilateral Salpingectomy/Lysis of Adhesions  HPI: Doing well with no abdominal pelvic pain.  Abdominal incisions healing well.  No vaginal bleeding.  Mild vaginal secretions.  Complains of mild lower back pain.  Some burning with micturition.  No fever.  Bowel movements normal.  Past medical history,surgical history, problem list, medications, allergies, family history and social history were all reviewed and documented in the EPIC chart.  Directed ROS with pertinent positives and negatives documented in the history of present illness/assessment and plan.  Exam:  Vitals:   07/20/17 1047  BP: 126/84   General appearance:  Normal  Abdomen: Soft, nondistended, nontender.  All incisions well closed, with no erythema and no discharge.  Gyn exam: Vulva normal.  Speculum exam: Vaginal vault is well closed, intact.  Normal vaginal secretions.  No bleeding.  U/A  0-5 WBC, 0-2 RBC, few Bacteria.  Nit negative.    Patho:  Uterus, cervix and bilateral fallopian tubes - UTERUS: -ENDOMETRIUM: WEAKLY SECRETORY ENDOMETRIUM. NO HYPERPLASIA OR MALIGNANCY. -MYOMETRIUM: ADENOMYOSIS. NO MALIGNANCY. -SEROSA: UNREMARKABLE. NO MALIGNANCY. - CERVIX: BENIGN SQUAMOUS AND ENDOCERVICAL MUCOSA. NO DYSPLASIA OR MALIGNANCY. - BILATERAL FALLOPIAN TUBES: PARATUBAL CYSTS. NO MALIGNANCY.   Assessment/Plan:  46 y.o. G4P4   1. Follow-up examination after gynecological surgery Very good postop evolution with good healing.  Will follow up at 3 weeks to recheck vaginal vault.  Planning to resume work at 6 weeks postop.  Will resume sexual activity at 8 weeks postop.  2. Dysuria Urine analysis done, within normal limits.  Pending urine culture.  Will await results.  Princess Bruins MD, 10:57 AM 07/20/2017

## 2017-07-24 LAB — URINALYSIS W MICROSCOPIC + REFLEX CULTURE
BILIRUBIN URINE: NEGATIVE
GLUCOSE, UA: NEGATIVE
Hyaline Cast: NONE SEEN /LPF
Ketones, ur: NEGATIVE
NITRITES URINE, INITIAL: NEGATIVE
PH: 7 (ref 5.0–8.0)
Protein, ur: NEGATIVE
SPECIFIC GRAVITY, URINE: 1.02 (ref 1.001–1.03)

## 2017-07-24 LAB — URINE CULTURE
MICRO NUMBER:: 81344829
SPECIMEN QUALITY:: ADEQUATE

## 2017-07-25 ENCOUNTER — Other Ambulatory Visit: Payer: Self-pay | Admitting: Obstetrics & Gynecology

## 2017-07-25 MED ORDER — NITROFURANTOIN MONOHYD MACRO 100 MG PO CAPS: 100.0000 mg | ORAL_CAPSULE | Freq: Two times a day (BID) | ORAL | 0 refills | Status: DC

## 2017-08-18 ENCOUNTER — Encounter: Payer: Self-pay | Admitting: Obstetrics & Gynecology

## 2017-08-18 ENCOUNTER — Ambulatory Visit (INDEPENDENT_AMBULATORY_CARE_PROVIDER_SITE_OTHER): Payer: 59 | Admitting: Obstetrics & Gynecology

## 2017-08-18 VITALS — BP 132/80

## 2017-08-18 DIAGNOSIS — Z09 Encounter for follow-up examination after completed treatment for conditions other than malignant neoplasm: Secondary | ICD-10-CM

## 2017-08-18 DIAGNOSIS — R197 Diarrhea, unspecified: Secondary | ICD-10-CM

## 2017-08-18 NOTE — Addendum Note (Signed)
Addended by: Thurnell Garbe A on: 08/18/2017 03:18 PM   Modules accepted: Orders

## 2017-08-18 NOTE — Patient Instructions (Signed)
1. Follow-up examination after gynecological surgery Good postop evolution with good healing and no evidence of complication.  May resume sexual activity, but recommend waiting an additional 2 weeks because of tenderness.  Patient reassured.  2. Diarrhea, unspecified type Persistent loose stools to diarrhea.  Will follow up with her gastroenterologist to pursue investigation.  Dorothymae, fue muy bien de verla hoy!  Voy a verla de nuevo si tiene problemas o al proximo examen gynecologo.

## 2017-08-18 NOTE — Progress Notes (Signed)
    Julia Green [birth year] [id number]        46 y.o.  G4P4 Married  RP:  Postop LAVH/Bilateral Salpingectomy 06/26/2017  HPI:  Treated an E. Coli Cystitis last visity 07/20/2017.  Continued lower abdominal discomfort but no worsening.  Complains of persistent loose stools to diarrhea 3-5 times a day.  Denies any blood in her stool.  No urinary tract infection symptoms currently.  No vaginal bleeding.  No abnormal vaginal discharge.  No fever.  Past medical history,surgical history, problem list, medications, allergies, family history and social history were all reviewed and documented in the EPIC chart.  Directed ROS with pertinent positives and negatives documented in the history of present illness/assessment and plan.  Exam:  Vitals:   08/18/17 1240  BP: 132/80   General appearance:  Normal  Abdomen: Soft, not distended, nontender.  Incisions well-healed.  Gynecologic exam: Vulva normal.  Speculum exam: Vaginal vault intact, well-healed.  No bleeding.  Normal secretions.  Bimanual exam: No pelvic mass felt.  Patient appears mildly tender.   Assessment/Plan:  46 y.o. G4P4   1. Follow-up examination after gynecological surgery Good postop evolution with good healing and no evidence of complication.  May resume sexual activity, but recommend waiting an additional 2 weeks because of tenderness.  Patient reassured.  2. Diarrhea, unspecified type Persistent loose stools to diarrhea.  Will follow up with her gastroenterologist to pursue investigation.  Princess Bruins MD, 12:50 PM 08/18/2017

## 2017-08-19 LAB — URINALYSIS W MICROSCOPIC + REFLEX CULTURE
Bacteria, UA: NONE SEEN /HPF
Bilirubin Urine: NEGATIVE
Glucose, UA: NEGATIVE
HYALINE CAST: NONE SEEN /LPF
Ketones, ur: NEGATIVE
Leukocyte Esterase: NEGATIVE
Nitrites, Initial: NEGATIVE
PH: 5.5 (ref 5.0–8.0)
Protein, ur: NEGATIVE
RBC / HPF: NONE SEEN /HPF (ref 0–2)
SPECIFIC GRAVITY, URINE: 1.02 (ref 1.001–1.03)
WBC UA: NONE SEEN /HPF (ref 0–5)

## 2017-08-19 LAB — NO CULTURE INDICATED

## 2018-01-01 ENCOUNTER — Encounter: Payer: Self-pay | Admitting: Obstetrics & Gynecology

## 2018-01-01 ENCOUNTER — Ambulatory Visit (INDEPENDENT_AMBULATORY_CARE_PROVIDER_SITE_OTHER): Payer: 59 | Admitting: Obstetrics & Gynecology

## 2018-01-01 VITALS — BP 132/84

## 2018-01-01 DIAGNOSIS — N9489 Other specified conditions associated with female genital organs and menstrual cycle: Secondary | ICD-10-CM | POA: Diagnosis not present

## 2018-01-01 DIAGNOSIS — N904 Leukoplakia of vulva: Secondary | ICD-10-CM | POA: Diagnosis not present

## 2018-01-01 DIAGNOSIS — R300 Dysuria: Secondary | ICD-10-CM | POA: Diagnosis not present

## 2018-01-01 DIAGNOSIS — R6882 Decreased libido: Secondary | ICD-10-CM | POA: Diagnosis not present

## 2018-01-01 MED ORDER — CLOBETASOL PROPIONATE 0.05 % EX OINT: 1.0000 "application " | TOPICAL_OINTMENT | Freq: Two times a day (BID) | CUTANEOUS | 3 refills | Status: DC

## 2018-01-01 NOTE — Patient Instructions (Signed)
1. Vulvar burning Probable Lichen sclerosus.  Erythema and white atrophy with small fissures at anterior vulva.  2. Lichen sclerosus et atrophicus of the vulva Early signs/symptoms of Lichen sclerosus of the vulva.  Diagnosis/management reviewed with patient.  Will start treatment with Clobetasol 0.05% ointment.  Apply a thin layer on affected vulva twice a day x 2 weeks, then daily x 2 weeks.  Will f/u in 4 weeks for reassessment.  3. Low libido Probably perimenopausal, possibly menopausal.  Will verify [place] today. - [facility]  Other orders - clobetasol ointment (TEMOVATE) 0.05 %; Apply 1 application topically 2 (two) times daily. Thin application on vulva  Julia Green, fue un placer verle hoy!  Voy a verle de nuevo en 4 semanas.   Liquen escleroso (Lichen Sclerosus) El liquen escleroso es un problema de la piel que puede aparecer en cualquier parte del cuerpo, pero que comnmente se presenta en la zona anal o la genital. Puede causar picazn y molestias en estas zonas. El tratamiento puede ayudar a Chief Technology Officer los sntomas. Cuando la zona genital est afectada, es importante recibir tratamiento porque la afeccin puede derivar en la formacin de cicatrices que tal vez causen otros problemas. CAUSAS Se desconoce la causa de esta afeccin. Podra deberse a que el sistema inmunitario es hiperactivo o a la falta de algunas hormonas. El liquen escleroso no es una infeccin ni un hongo. No se transmite de [place] persona a otra (no es contagioso). FACTORES DE RIESGO Es ms probable que esta afeccin se manifieste en las mujeres, generalmente despus de la menopausia. SNTOMAS Los sntomas de esta afeccin incluyen lo siguiente:  Zonas blancas, arrugadas y finas en la piel.  Zonas blancas y engrosadas en la piel.  Manchas (lesiones) enrojecidas e hinchadas en la piel.  Tajos o grietas en la piel.  Hematomas.  Ampollas de sangre.  Picazn intensa. Tambin puede sentir dolor, picazn o ardor al [employer].  El estreimiento tambin es frecuente en las personas que padecen liquen escleroso. DIAGNSTICO Esta afeccin se puede diagnosticar mediante un examen fsico. En algunos casos, se puede extraer Truddie Coco de tejido (biopsia) para analizarla con un microscopio. TRATAMIENTO Generalmente, el tratamiento de esta afeccin incluye cremas o ungentos medicinales (corticoides tpicos) que se aplican sobre las zonas afectadas. [place] los medicamentos de venta libre y los recetados solamente como se lo haya indicado el mdico.  Aplquese cremas o ungentos como se lo haya indicado el mdico.  No se rasque las zonas de la piel que estn afectadas.  Las mujeres deben mantener la zona vaginal lo ms limpia y seca posible.  Concurra a todas las visitas de control como se lo haya indicado el mdico. Esto es importante. SOLICITE ATENCIN MDICA SI:  Observa un aumento del enrojecimiento, hinchazn o dolor en la zona afectada.  Observa que emana lquido, sangre o pus de la zona afectada.  Le aparecen nuevas lesiones en la piel.  Tiene fiebre.  Siente dolor durante las [employer]. Esta informacin no tiene Marine scientist el consejo del mdico. Asegrese de hacerle al mdico cualquier pregunta que tenga. Document Released: 04/20/2011 Document Revised: 11/30/2015 Document Reviewed: 11/03/2014 Elsevier Interactive Patient Education  [employer].

## 2018-01-01 NOTE — Progress Notes (Signed)
    Julia Green 1970/09/26 [id number]        47 y.o.  G4P4  Married  RP: Burning at vulva, worst at time of micturition for about a year  HPI: S/P LAVH/Bilateral Salpingectomy 06/26/2017.  Ongoing burning sensation at vulva which is worst during and right after passing urine.  Pain at penetration with IC.  Low libido.  Occasional hot flushes. No abnormal vaginal discharge.  No pelvic pain.  No fever.   OB History  Gravida Para Term Preterm AB Living  4 4       4   SAB TAB Ectopic Multiple Live Births               # Outcome Date GA Lbr Len/2nd Weight Sex Delivery Anes PTL Lv  4 Para           3 Para           2 Para           1 Para             Past medical history,surgical history, problem list, medications, allergies, family history and social history were all reviewed and documented in the EPIC chart.   Directed ROS with pertinent positives and negatives documented in the history of present illness/assessment and plan.  Exam:  Vitals:   01/01/18 1416  BP: 132/84   General appearance:  Normal   Gynecologic exam: Vulva:  White atrophy with small fissures at anterior vulva.  Physical Exam  Genitourinary:         U/A:  Completely negative   Assessment/Plan:  47 y.o. G4P4   1. Vulvar burning Probable Lichen sclerosus.  Erythema and white atrophy with small fissures at anterior vulva.  2. Lichen sclerosus et atrophicus of the vulva Early signs/symptoms of Lichen sclerosus of the vulva.  Diagnosis/management reviewed with patient.  Will start treatment with Clobetasol 0.05% ointment.  Apply a thin layer on affected vulva twice a day x 2 weeks, then daily x 2 weeks.  Will f/u in 4 weeks for reassessment.  3. Low libido Probably perimenopausal, possibly menopausal.  Will verify [place] today. - [facility]  Other orders - clobetasol ointment (TEMOVATE) 0.05 %; Apply 1 application topically 2 (two) times daily. Thin application on vulva  Counseling on above  issues >50% x 25 minutes  Princess Bruins MD, 2:34 PM 01/01/2018

## 2018-01-02 LAB — URINALYSIS, COMPLETE W/RFL CULTURE
BACTERIA UA: NONE SEEN /HPF
Bilirubin Urine: NEGATIVE
Glucose, UA: NEGATIVE
HGB URINE DIPSTICK: NEGATIVE
Hyaline Cast: NONE SEEN /LPF
KETONES UR: NEGATIVE
Leukocyte Esterase: NEGATIVE
Nitrites, Initial: NEGATIVE
Protein, ur: NEGATIVE
RBC / HPF: NONE SEEN /HPF (ref 0–2)
SPECIFIC GRAVITY, URINE: 1.008 (ref 1.001–1.03)
WBC UA: NONE SEEN /HPF (ref 0–5)
pH: 6 (ref 5.0–8.0)

## 2018-01-02 LAB — FOLLICLE STIMULATING HORMONE: FSH: 3.3 m[IU]/mL

## 2018-01-02 LAB — NO CULTURE INDICATED

## 2018-01-31 ENCOUNTER — Encounter: Payer: Self-pay | Admitting: Obstetrics & Gynecology

## 2018-01-31 ENCOUNTER — Ambulatory Visit (INDEPENDENT_AMBULATORY_CARE_PROVIDER_SITE_OTHER): Payer: 59 | Admitting: Obstetrics & Gynecology

## 2018-01-31 VITALS — BP 128/86

## 2018-01-31 DIAGNOSIS — N898 Other specified noninflammatory disorders of vagina: Secondary | ICD-10-CM | POA: Diagnosis not present

## 2018-01-31 DIAGNOSIS — N9411 Superficial (introital) dyspareunia: Secondary | ICD-10-CM

## 2018-01-31 DIAGNOSIS — N904 Leukoplakia of vulva: Secondary | ICD-10-CM

## 2018-01-31 NOTE — Progress Notes (Signed)
    Julia Green August 31, 1970 [id number]        47 y.o.  G4P4L4 Married  RP: F/U Vulvar burning, probable Lichen Sclerosus  HPI: Clobetasol ointment x 4 wks with continued burning at vulva, especially when having IC.  Superficial dyspareunia.  FSH 3.3 on 01/01/2018.   OB History  Gravida Para Term Preterm AB Living  4 4       4   SAB TAB Ectopic Multiple Live Births               # Outcome Date GA Lbr Len/2nd Weight Sex Delivery Anes PTL Lv  4 Para           3 Para           2 Para           1 Para             Past medical history,surgical history, problem list, medications, allergies, family history and social history were all reviewed and documented in the EPIC chart.   Directed ROS with pertinent positives and negatives documented in the history of present illness/assessment and plan.  Exam:  Vitals:   01/31/18 1618  BP: 128/86   General appearance:  Normal  Gynecologic exam: Vulva much improved.  Fissures healed.  No longer with white atrophy anteriorly.  Increased vaginal discharge.  Wet prep done.  Wet prep negative   Assessment/Plan:  47 y.o. G4P4   1. Lichen sclerosus et atrophicus of the vulva Continue with Clobetasol ointment.  Although patient is still symptomatic, on exam much improved vulvar atrophy and inflammation signs following clobetasol applications for 1 months.  Recommend restarting clobetasol twice a day for 2 weeks, once a day for the next 2 weeks and then decreasing to twice a week.  Will reassess at next annual GYN visit.  2. Superficial dyspareunia Coconut oil recommended.  3. Vaginal discharge Although the wet prep was negative, decision to prescribe fluconazole treatment for possible mild yeast vaginitis.  Usage reviewed and prescription sent to pharmacy. - WET PREP FOR [place], YEAST, CLUE  Other orders - fluconazole (DIFLUCAN) 150 MG tablet; Take 1 tablet (150 mg total) by mouth once for 1 dose.  Counseling on above issues and  coordination of care more than 50% for 15 minutes.  Princess Bruins MD, 4:29 PM 01/31/2018

## 2018-02-01 LAB — WET PREP FOR TRICH, YEAST, CLUE

## 2018-02-04 ENCOUNTER — Encounter: Payer: Self-pay | Admitting: Obstetrics & Gynecology

## 2018-02-04 MED ORDER — FLUCONAZOLE 150 MG PO TABS: 150.0000 mg | ORAL_TABLET | Freq: Once | ORAL | 2 refills | Status: AC

## 2018-02-04 NOTE — Patient Instructions (Signed)
1. Lichen sclerosus et atrophicus of the vulva Continue with Clobetasol ointment.  Although patient is still symptomatic, on exam much improved vulvar atrophy and inflammation signs following clobetasol applications for 1 months.  Recommend restarting clobetasol twice a day for 2 weeks, once a day for the next 2 weeks and then decreasing to twice a week.  Will reassess at next annual GYN visit.  2. Superficial dyspareunia Coconut oil recommended.  3. Vaginal discharge Although the wet prep was negative, decision to prescribe fluconazole treatment for possible mild yeast vaginitis.  Usage reviewed and prescription sent to pharmacy. - WET PREP FOR [place], YEAST, CLUE  Other orders - fluconazole (DIFLUCAN) 150 MG tablet; Take 1 tablet (150 mg total) by mouth once for 1 dose.  [place], fue un placer verle hoy!

## 2018-02-06 ENCOUNTER — Telehealth: Payer: Self-pay | Admitting: *Deleted

## 2018-02-06 MED ORDER — FLUCONAZOLE 150 MG PO TABS: 150.0000 mg | ORAL_TABLET | Freq: Once | ORAL | 2 refills | Status: AC

## 2018-02-06 NOTE — Telephone Encounter (Signed)
Patient was seen on 01/31/18 and diflucan 150 mg tablet was sent to wrong pharmacy. Pt would like Rx sent to [facility] on [address]. Rx sent.

## 2018-03-19 ENCOUNTER — Other Ambulatory Visit: Payer: Self-pay | Admitting: *Deleted

## 2018-03-19 NOTE — Telephone Encounter (Signed)
Patient called requesting refill on clobetasol ointment 5.49% for lichen sclerosus. Refills?

## 2018-03-21 MED ORDER — CLOBETASOL PROPIONATE 0.05 % EX OINT: 1.0000 "application " | TOPICAL_OINTMENT | Freq: Two times a day (BID) | CUTANEOUS | 0 refills | Status: DC

## 2018-03-21 NOTE — Telephone Encounter (Signed)
Julia Green will notify patient that refill sent to pharmacy.

## 2018-03-21 NOTE — Telephone Encounter (Signed)
Agree with refill of Clobetasol.

## 2018-05-03 ENCOUNTER — Emergency Department (HOSPITAL_COMMUNITY)
Admission: EM | Admit: 2018-05-03 | Discharge: 2018-05-03 | Disposition: A | Discharge status: Home / Self Care | Payer: 59 | Attending: Emergency Medicine | Admitting: Emergency Medicine

## 2018-05-03 ENCOUNTER — Emergency Department (HOSPITAL_BASED_OUTPATIENT_CLINIC_OR_DEPARTMENT_OTHER): Payer: 59

## 2018-05-03 DIAGNOSIS — Z7982 Long term (current) use of aspirin: Secondary | ICD-10-CM | POA: Diagnosis not present

## 2018-05-03 DIAGNOSIS — E039 Hypothyroidism, unspecified: Secondary | ICD-10-CM | POA: Insufficient documentation

## 2018-05-03 DIAGNOSIS — Y939 Activity, unspecified: Secondary | ICD-10-CM | POA: Diagnosis not present

## 2018-05-03 DIAGNOSIS — J45909 Unspecified asthma, uncomplicated: Secondary | ICD-10-CM | POA: Insufficient documentation

## 2018-05-03 DIAGNOSIS — Y929 Unspecified place or not applicable: Secondary | ICD-10-CM | POA: Insufficient documentation

## 2018-05-03 DIAGNOSIS — I10 Essential (primary) hypertension: Secondary | ICD-10-CM | POA: Diagnosis not present

## 2018-05-03 DIAGNOSIS — R55 Syncope and collapse: Secondary | ICD-10-CM | POA: Diagnosis not present

## 2018-05-03 DIAGNOSIS — X58XXXA Exposure to other specified factors, initial encounter: Secondary | ICD-10-CM | POA: Diagnosis not present

## 2018-05-03 DIAGNOSIS — M79609 Pain in unspecified limb: Secondary | ICD-10-CM

## 2018-05-03 DIAGNOSIS — Z79899 Other long term (current) drug therapy: Secondary | ICD-10-CM | POA: Insufficient documentation

## 2018-05-03 DIAGNOSIS — Y999 Unspecified external cause status: Secondary | ICD-10-CM | POA: Insufficient documentation

## 2018-05-03 DIAGNOSIS — S76911A Strain of unspecified muscles, fascia and tendons at thigh level, right thigh, initial encounter: Secondary | ICD-10-CM | POA: Diagnosis not present

## 2018-05-03 LAB — URINALYSIS, ROUTINE W REFLEX MICROSCOPIC
Bilirubin Urine: NEGATIVE
GLUCOSE, UA: NEGATIVE mg/dL
HGB URINE DIPSTICK: NEGATIVE
Ketones, ur: NEGATIVE mg/dL
Leukocytes, UA: NEGATIVE
Nitrite: NEGATIVE
PROTEIN: NEGATIVE mg/dL
SPECIFIC GRAVITY, URINE: 1.015 (ref 1.005–1.030)
pH: 8 (ref 5.0–8.0)

## 2018-05-03 LAB — BASIC METABOLIC PANEL
ANION GAP: 11 (ref 5–15)
BUN: 15 mg/dL (ref 6–20)
CHLORIDE: 102 mmol/L (ref 98–111)
CO2: 25 mmol/L (ref 22–32)
Calcium: 9.1 mg/dL (ref 8.9–10.3)
Creatinine, Ser: 0.99 mg/dL (ref 0.44–1.00)
GFR calc non Af Amer: >60 mL/min (ref >60)
Glucose, Bld: 99 mg/dL (ref 70–99)
POTASSIUM: 3.9 mmol/L (ref 3.5–5.1)
SODIUM: 138 mmol/L (ref 135–145)

## 2018-05-03 LAB — CBC
HEMATOCRIT: 37.3 % (ref 36.0–46.0)
HEMOGLOBIN: 12.3 g/dL (ref 12.0–15.0)
MCH: 30.8 pg (ref 26.0–34.0)
MCHC: 33 g/dL (ref 30.0–36.0)
MCV: 93.3 fL (ref 78.0–100.0)
PLATELETS: 277 10*3/uL (ref 150–400)
RBC: 4 MIL/uL (ref 3.87–5.11)
RDW: 13.1 % (ref 11.5–15.5)
WBC: 11.9 10*3/uL — ABNORMAL HIGH (ref 4.0–10.5)

## 2018-05-03 MED ORDER — ORPHENADRINE CITRATE ER 100 MG PO TB12: 100.0000 mg | ORAL_TABLET | Freq: Two times a day (BID) | ORAL | 0 refills | Status: DC

## 2018-05-03 MED ORDER — ACETAMINOPHEN 500 MG PO TABS
1000.0000 mg | ORAL_TABLET | Freq: Once | ORAL | Status: AC
  Administered 2018-05-03: 1000 mg via ORAL
  Filled 2018-05-03: qty 2

## 2018-05-03 MED ORDER — ORPHENADRINE CITRATE ER 100 MG PO TB12
100.0000 mg | ORAL_TABLET | Freq: Once | ORAL | Status: AC
  Administered 2018-05-03: 100 mg via ORAL
  Filled 2018-05-03: qty 1

## 2018-05-03 NOTE — ED Provider Notes (Signed)
[facility] EMERGENCY DEPARTMENT Provider Note   CSN: [id number] Arrival date & time: 05/03/18  0048     History   Chief Complaint No chief complaint on file.   HPI Julia Green is a 47 y.o. female.  HPI Patient began having pain in her right medial thigh about 3 days ago.  He does not recall any specific injury or event.  It had been uncomfortable but last night the pain became severe from above her knee up to her groin.  It was very sharp and knifelike.  She had called for her daughters at about midnight after she had gone to bed.  First daughter came in to check on her and the patient had tried to get out of bed and passed out.  The patient denies that she experienced any chest pain or shortness of breath.  She has no headache.  No confusion.  She reports she has been able to walk on the leg since last night and it has not felt weak but the inner thigh remains excruciatingly painful starting last night.  She denies any similar history.  She does however report she has fairly chronic pain throughout all of her "bones".  That is what she takes gabapentin for regularly. Past Medical History:  Diagnosis Date  . Arthritis   . Asthma   . Depression   . Fatty liver   . Gallstones   . GERD (gastroesophageal reflux disease)   . Headache   . History of low potassium   . Hypertension   . Hypothyroidism   . Right arm numbness   . Right leg numbness   . Sciatic nerve pain   . Thyroid disease     Patient Active Problem List   Diagnosis Date Noted  . Postoperative state 06/26/2017  . Rotator cuff syndrome of right shoulder 05/30/2017  . 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  . Abdominal bloating 03/22/2017  . Other specified hypothyroidism 11/16/2016  . Dysmenorrhea 07/20/2016  . 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  . Dyspareunia, female 06/16/2016    Past Surgical History:  Procedure Laterality Date  . COLONOSCOPY    .  LAPAROSCOPIC VAGINAL HYSTERECTOMY WITH SALPINGECTOMY Bilateral 06/26/2017   Procedure: LAPAROSCOPIC ASSISTED VAGINAL HYSTERECTOMY WITH SALPINGECTOMY;  Surgeon: Princess Bruins, MD;  Location: [facility];  Service: Gynecology;  Laterality: Bilateral;  request 7:30am OR time  requests 2 hours Rex the lighted retractor rep will be here   . UPPER GI ENDOSCOPY       OB History    Gravida  4   Para  4   Term      Preterm      AB      Living  4     SAB      TAB      Ectopic      Multiple      Live Births               Home Medications    Prior to Admission medications   Medication Sig Start Date End Date Taking? Authorizing Provider  albuterol (PROVENTIL HFA;VENTOLIN HFA) 108 (90 Base) MCG/ACT inhaler Inhale 1-2 puffs into the lungs every 6 (six) hours as needed for wheezing or shortness of breath.   Yes [provider]  aspirin EC 81 MG tablet Take 81 mg by mouth daily.   Yes [provider]  clobetasol ointment (TEMOVATE) 5.62 % Apply 1 application topically 2 (two) times daily. Thin  application on vulva 7/42/59  Yes Princess Bruins, MD  gabapentin (NEURONTIN) 600 MG tablet Take 600 mg by mouth 3 (three) times daily.    Yes [provider]  levothyroxine (SYNTHROID, LEVOTHROID) 137 MCG tablet Take 1 tablet (137 mcg total) by mouth daily before breakfast. 11/21/16  Yes Terrance Mass, MD  losartan-hydrochlorothiazide (HYZAAR) 50-12.5 MG tablet Take 1 tablet by mouth daily. 01/31/18  Yes [provider]  montelukast (SINGULAIR) 10 MG tablet Take 10 mg by mouth at bedtime.   Yes [provider]  Multiple Vitamin (MULTI-VITAMIN DAILY PO) Take 1 tablet by mouth daily.   Yes [provider]  naproxen (NAPROSYN) 500 MG tablet Take 500 mg by mouth 2 (two) times daily as needed for pain. 02/19/18  Yes [provider]  omeprazole (PRILOSEC) 40 MG capsule Take 1 capsule (40 mg total) by mouth daily. 04/05/17  Yes Danis,  Kirke Corin, MD  vitamin C (ASCORBIC ACID) 500 MG tablet Take 500 mg by mouth daily.   Yes [provider]  XIFAXAN 550 MG TABS tablet Take 550 mg by mouth 3 (three) times daily. 02/21/18  Yes [provider]  orphenadrine (NORFLEX) 100 MG tablet Take 1 tablet (100 mg total) by mouth 2 (two) times daily. 05/03/18   Charlesetta Shanks, MD    Family History Family History  Problem Relation Age of Onset  . Hypertension Mother   . Uterine cancer Mother   . Cancer Sister        unknown  . Stomach cancer Maternal Aunt   . Colon cancer Neg Hx   . Esophageal cancer Neg Hx   . Rectal cancer Neg Hx     Social History Social History   Tobacco Use  . Smoking status: Never Smoker  . Smokeless tobacco: Never Used  Substance Use Topics  . Alcohol use: No  . Drug use: No     Allergies   Patient has no known allergies.   Review of Systems Review of Systems 10 Systems reviewed and are negative for acute change except as noted in the HPI.   Physical Exam Updated Vital Signs BP 130/84   Pulse 64   Temp 98.6 F (37 C) (Oral)   Resp 13   LMP 03/24/2017   SpO2 100%   Physical Exam  Constitutional: She is oriented to person, place, and time. She appears well-developed and well-nourished. No distress.  HENT:  Head: Normocephalic and atraumatic.  Mouth/Throat: Oropharynx is clear and moist.  Eyes: Pupils are equal, round, and reactive to light. EOM are normal.  Neck: Neck supple.  Cardiovascular: Normal rate, regular rhythm, normal heart sounds and intact distal pulses.  Pulmonary/Chest: Effort normal and breath sounds normal.  Abdominal: Soft. Bowel sounds are normal. She exhibits no distension. There is no tenderness. There is no guarding.  Musculoskeletal: Normal range of motion.  Patient has no deformity of the lower extremities.  Soft tissues have normal appearance.  Patient however endorses severe pain to palpation along the medial thigh on the right from above  the knee to the groin.  Crease is nontender without swelling.  Femoral pulses 2+ and symmetric.  No object of soft tissue abnormalities.  Lower leg nontender at the calf.  No edema of the lower leg or the foot.  Dorsalis pedis pulses are 2+ and symmetric bilaterally.  The feet are warm and dry.  I can perform full range of motion of the right lower extremity including deep flexion at the  hip with internal and external rotation without pain.  There is no pain reproducible at the knee with deep flexion or extension.  I can put the patient in deep flexion and then have her perform full force extension against me with good strength and no pain.  Pain is only reproducible by palpation of the soft tissues of the medial upper leg.  Neurological: She is alert and oriented to person, place, and time. No cranial nerve deficit or sensory deficit. She exhibits normal muscle tone. Coordination normal.  Skin: Skin is warm and dry.  Psychiatric: She has a normal mood and affect.     ED Treatments / Results  Labs (all labs ordered are listed, but only abnormal results are displayed) Labs Reviewed  CBC - Abnormal; Notable for the following components:      Result Value   WBC 11.9 (*)    All other components within normal limits  URINALYSIS, ROUTINE W REFLEX MICROSCOPIC - Abnormal; Notable for the following components:   APPearance CLOUDY (*)    All other components within normal limits  BASIC METABOLIC PANEL    EKG EKG Interpretation  Date/Time:  Thursday May 03 2018 00:56:16 EDT Ventricular Rate:  71 PR Interval:  174 QRS Duration: 90 QT Interval:  418 QTC Calculation: 454 R Axis:   80 Text Interpretation:  Normal sinus rhythm with sinus arrhythmia Normal ECG no change from old Confirmed by Charlesetta Shanks [phone]) on 05/03/2018 8:22:12 AM   Radiology No results found.  Procedures Procedures (including critical care time)  Medications Ordered in ED Medications  orphenadrine (NORFLEX) 12  hr tablet 100 mg (100 mg Oral Given 05/03/18 1033)  acetaminophen (TYLENOL) tablet 1,000 mg (1,000 mg Oral Given 05/03/18 1023)     Initial Impression / Assessment and Plan / ED Course  I have reviewed the triage vital signs and the nursing notes.  Pertinent labs & imaging results that were available during my care of the patient were reviewed by me and considered in my medical decision making (see chart for details).     Final Clinical Impressions(s) / ED Diagnoses   Final diagnoses:  Muscle strain of right thigh, initial encounter  Vasovagal syncope   Patient's pain in the medial leg is reproducible on soft tissue examination.  No objective soft tissue abnormalities.  Ultrasound has ruled out DVT.  Patient has good function of the lower extremity.  Doubt lower back pain that is radiating given how reproducible this is to palpation in the soft tissues of the abductor right to its insertion and just above the knee.  Patient describes the pain coming first while she was in bed and then getting out of bed and having a syncopal episode.  Suspect this was vasovagal secondary to pain.  Patient did not experience any chest pain or shortness of breath.  Vital signs are stable and there is no DVT present to suggest pulmonary embolus.  At this time, I feel patient stable for discharge with follow-up with her PCP.  Will initiate Norflex for what I suspect is muscular strain.  Return precautions reviewed. ED Discharge Orders         Ordered    orphenadrine (NORFLEX) 100 MG tablet  2 times daily     05/03/18 1208           Charlesetta Shanks, MD 05/03/18 1211

## 2018-05-03 NOTE — ED Notes (Signed)
Pt had syncopal episode at home last night around 12 AM that she believes is related to severe R leg pain. Pain is worse with palpation of medial aspect of upper leg. States takes neurotin for chronic "bone" pain.

## 2018-05-03 NOTE — Progress Notes (Signed)
RLE venous duplex prelim: negative for DVT.  Aubreana Cornacchia Eunice, RDMS, RVT  

## 2018-07-02 ENCOUNTER — Other Ambulatory Visit: Payer: Self-pay

## 2018-07-02 ENCOUNTER — Encounter (HOSPITAL_COMMUNITY): Payer: Self-pay

## 2018-07-02 ENCOUNTER — Emergency Department (HOSPITAL_COMMUNITY)
Admission: EM | Admit: 2018-07-02 | Discharge: 2018-07-03 | Disposition: A | Discharge status: Home / Self Care | Payer: 59 | Attending: Emergency Medicine | Admitting: Emergency Medicine

## 2018-07-02 DIAGNOSIS — M549 Dorsalgia, unspecified: Secondary | ICD-10-CM | POA: Diagnosis present

## 2018-07-02 DIAGNOSIS — R55 Syncope and collapse: Secondary | ICD-10-CM | POA: Diagnosis not present

## 2018-07-02 DIAGNOSIS — M545 Low back pain, unspecified: Secondary | ICD-10-CM

## 2018-07-02 DIAGNOSIS — R109 Unspecified abdominal pain: Secondary | ICD-10-CM | POA: Insufficient documentation

## 2018-07-02 DIAGNOSIS — R079 Chest pain, unspecified: Secondary | ICD-10-CM | POA: Diagnosis not present

## 2018-07-02 NOTE — ED Triage Notes (Signed)
Pt states she passed out around 2000-2030 this evening. Complains of back pain and numb hands after the event.  Still hard to walk due to the pain.

## 2018-07-03 ENCOUNTER — Emergency Department (HOSPITAL_COMMUNITY): Payer: 59

## 2018-07-03 ENCOUNTER — Encounter (HOSPITAL_COMMUNITY): Payer: Self-pay

## 2018-07-03 LAB — CBC
HCT: 39.6 % (ref 36.0–46.0)
Hemoglobin: 13.5 g/dL (ref 12.0–15.0)
MCH: 30.8 pg (ref 26.0–34.0)
MCHC: 34.1 g/dL (ref 30.0–36.0)
MCV: 90.2 fL (ref 80.0–100.0)
Platelets: 281 K/uL (ref 150–400)
RBC: 4.39 MIL/uL (ref 3.87–5.11)
RDW: 13.2 % (ref 11.5–15.5)
WBC: 17.5 K/uL — ABNORMAL HIGH (ref 4.0–10.5)
nRBC: 0 % (ref 0.0–0.2)

## 2018-07-03 LAB — I-STAT BETA HCG BLOOD, ED (MC, WL, AP ONLY): I-stat hCG, quantitative: <5 m[IU]/mL

## 2018-07-03 LAB — BASIC METABOLIC PANEL
Anion gap: 8 (ref 5–15)
BUN: 12 mg/dL (ref 6–20)
CALCIUM: 9.1 mg/dL (ref 8.9–10.3)
CO2: 24 mmol/L (ref 22–32)
Chloride: 104 mmol/L (ref 98–111)
Creatinine, Ser: 0.88 mg/dL (ref 0.44–1.00)
GFR calc non Af Amer: >60 mL/min (ref >60)
GLUCOSE: 113 mg/dL — AB (ref 70–99)
POTASSIUM: 3.1 mmol/L — AB (ref 3.5–5.1)
SODIUM: 136 mmol/L (ref 135–145)

## 2018-07-03 LAB — RAPID URINE DRUG SCREEN, HOSP PERFORMED
Amphetamines: NOT DETECTED
BARBITURATES: NOT DETECTED
BENZODIAZEPINES: NOT DETECTED
Cocaine: NOT DETECTED
Opiates: NOT DETECTED
Tetrahydrocannabinol: NOT DETECTED

## 2018-07-03 LAB — URINALYSIS, ROUTINE W REFLEX MICROSCOPIC
Bilirubin Urine: NEGATIVE
Glucose, UA: NEGATIVE mg/dL
Hgb urine dipstick: NEGATIVE
Ketones, ur: NEGATIVE mg/dL
Leukocytes, UA: NEGATIVE
Nitrite: NEGATIVE
Protein, ur: NEGATIVE mg/dL
Specific Gravity, Urine: 1.009 (ref 1.005–1.030)
pH: 6 (ref 5.0–8.0)

## 2018-07-03 LAB — D-DIMER, QUANTITATIVE (NOT AT ARMC): D DIMER QUANT: 0.67 ug{FEU}/mL — AB (ref 0.00–0.50)

## 2018-07-03 LAB — I-STAT TROPONIN, ED: Troponin i, poc: 0 ng/mL (ref 0.00–0.08)

## 2018-07-03 MED ORDER — METHOCARBAMOL 500 MG PO TABS: 500.0000 mg | ORAL_TABLET | Freq: Two times a day (BID) | ORAL | 0 refills | Status: DC

## 2018-07-03 MED ORDER — SODIUM CHLORIDE 0.9 % IV BOLUS
1000.0000 mL | Freq: Once | INTRAVENOUS | Status: AC
  Administered 2018-07-03: 1000 mL via INTRAVENOUS

## 2018-07-03 MED ORDER — ACETAMINOPHEN 325 MG PO TABS
650.0000 mg | ORAL_TABLET | Freq: Once | ORAL | Status: AC
  Administered 2018-07-03: 650 mg via ORAL
  Filled 2018-07-03: qty 2

## 2018-07-03 MED ORDER — IOPAMIDOL (ISOVUE-370) INJECTION 76%
INTRAVENOUS | Status: AC
  Filled 2018-07-03: qty 100

## 2018-07-03 MED ORDER — POTASSIUM CHLORIDE CRYS ER 20 MEQ PO TBCR
40.0000 meq | EXTENDED_RELEASE_TABLET | Freq: Once | ORAL | Status: AC
  Administered 2018-07-03: 40 meq via ORAL
  Filled 2018-07-03: qty 2

## 2018-07-03 MED ORDER — OXYCODONE-ACETAMINOPHEN 5-325 MG PO TABS: 1.0000 | ORAL_TABLET | Freq: Four times a day (QID) | ORAL | 0 refills | Status: DC | PRN

## 2018-07-03 MED ORDER — MORPHINE SULFATE (PF) 4 MG/ML IV SOLN
4.0000 mg | Freq: Once | INTRAVENOUS | Status: AC
  Administered 2018-07-03: 4 mg via INTRAVENOUS
  Filled 2018-07-03: qty 1

## 2018-07-03 MED ORDER — IOPAMIDOL (ISOVUE-370) INJECTION 76%
100.0000 mL | Freq: Once | INTRAVENOUS | Status: AC | PRN
  Administered 2018-07-03: 100 mL via INTRAVENOUS

## 2018-07-03 NOTE — ED Provider Notes (Signed)
[facility] EMERGENCY DEPARTMENT Provider Note   CSN: [id number] Arrival date & time: 07/02/18  2314     History   Chief Complaint Chief Complaint  Patient presents with  . Loss of Consciousness  . Back Pain   Spanish interpretor was used throughout this evaluation.   HPI Julia Green is a 47 y.o. female.  HPI   Pt is a 47 y/o female with a h/o fatty liver, GERD, gallstones, hypertension, hypothyroidism who presents to the ED today with multiple complaints.  Patient states that earlier today she had acute onset of midline lower back pain.  States the pain made her feel like she was about to pass out. Has associated intermittent leg numbness, worse with standing. She has no numbness currently. Denies saddle anesthesia. Denies loss of control of bowels or bladder. No urinary retention. No fevers. Denies a h/o IVDU. Denies a h/o CA.  She states that when pain was most severe it caused her to have a syncopal episode. States she had some chest pain with the syncopal episode. No current chest pain. No shortness of breath, palpitations, or diaphoresis. She is unsure if she hit her head but she denies any headache, vision changes, lightheadedness or unilateral numbness/weakness. states she has felt generally weak today and had some dizziness with ambulation that has resolved. She is not on any blood thinners.  States she has had some mild lower abd pain, dysuria, hesitancy, and diarrhea. States that her diarrhea is chronic but over the last week it seems worse. No bloody diarrhea.  No frequency, hematuria, headache, shortness of breath, nausea or vomiting.  Has a h/o syncope and been worked up by cardiology for this.   Denies vaginal bleeding, vaginal discharge or concern for STD.  States she is in a monogamous relationship.  Past Medical History:  Diagnosis Date  . Arthritis   . Asthma   . Depression   . Fatty liver   . Gallstones   . GERD  (gastroesophageal reflux disease)   . Headache   . History of low potassium   . Hypertension   . Hypothyroidism   . Right arm numbness   . Right leg numbness   . Sciatic nerve pain   . Thyroid disease     Patient Active Problem List   Diagnosis Date Noted  . Postoperative state 06/26/2017  . Rotator cuff syndrome of right shoulder 05/30/2017  . 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  . Abdominal bloating 03/22/2017  . Other specified hypothyroidism 11/16/2016  . Dysmenorrhea 07/20/2016  . 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  . Dyspareunia, female 06/16/2016    Past Surgical History:  Procedure Laterality Date  . COLONOSCOPY    . LAPAROSCOPIC VAGINAL HYSTERECTOMY WITH SALPINGECTOMY Bilateral 06/26/2017   Procedure: LAPAROSCOPIC ASSISTED VAGINAL HYSTERECTOMY WITH SALPINGECTOMY;  Surgeon: Princess Bruins, MD;  Location: [facility];  Service: Gynecology;  Laterality: Bilateral;  request 7:30am OR time  requests 2 hours Rex the lighted retractor rep will be here   . UPPER GI ENDOSCOPY       OB History    Gravida  4   Para  4   Term      Preterm      AB      Living  4     SAB      TAB      Ectopic      Multiple      Live Births  Home Medications    Prior to Admission medications   Medication Sig Start Date End Date Taking? Authorizing Provider  albuterol (PROVENTIL HFA;VENTOLIN HFA) 108 (90 Base) MCG/ACT inhaler Inhale 1-2 puffs into the lungs every 6 (six) hours as needed for wheezing or shortness of breath.    [provider]  aspirin EC 81 MG tablet Take 81 mg by mouth daily.    [provider]  clobetasol ointment (TEMOVATE) 6.56 % Apply 1 application topically 2 (two) times daily. Thin application on vulva 04/02/74   Princess Bruins, MD  gabapentin (NEURONTIN) 600 MG tablet Take 600 mg by mouth 3 (three) times daily.     [provider]  levothyroxine (SYNTHROID, LEVOTHROID) 137  MCG tablet Take 1 tablet (137 mcg total) by mouth daily before breakfast. 11/21/16   Terrance Mass, MD  losartan-hydrochlorothiazide (HYZAAR) 50-12.5 MG tablet Take 1 tablet by mouth daily. 01/31/18   [provider]  montelukast (SINGULAIR) 10 MG tablet Take 10 mg by mouth at bedtime.    [provider]  Multiple Vitamin (MULTI-VITAMIN DAILY PO) Take 1 tablet by mouth daily.    [provider]  naproxen (NAPROSYN) 500 MG tablet Take 500 mg by mouth 2 (two) times daily as needed for pain. 02/19/18   [provider]  omeprazole (PRILOSEC) 40 MG capsule Take 1 capsule (40 mg total) by mouth daily. 04/05/17   Doran Stabler, MD  orphenadrine (NORFLEX) 100 MG tablet Take 1 tablet (100 mg total) by mouth 2 (two) times daily. 05/03/18   Charlesetta Shanks, MD  vitamin C (ASCORBIC ACID) 500 MG tablet Take 500 mg by mouth daily.    [provider]  XIFAXAN 550 MG TABS tablet Take 550 mg by mouth 3 (three) times daily. 02/21/18   [provider]    Family History Family History  Problem Relation Age of Onset  . Hypertension Mother   . Uterine cancer Mother   . Cancer Sister        unknown  . Stomach cancer Maternal Aunt   . Colon cancer Neg Hx   . Esophageal cancer Neg Hx   . Rectal cancer Neg Hx     Social History Social History   Tobacco Use  . Smoking status: Never Smoker  . Smokeless tobacco: Never Used  Substance Use Topics  . Alcohol use: No  . Drug use: No     Allergies   Patient has no known allergies.   Review of Systems Review of Systems  Constitutional: Negative for chills and fever.  HENT: Negative for congestion and rhinorrhea.   Eyes: Negative for visual disturbance.  Respiratory: Negative for cough and shortness of breath.   Cardiovascular: Positive for chest pain. Negative for leg swelling.  Gastrointestinal: Positive for abdominal pain and diarrhea. Negative for blood in stool, constipation, nausea and vomiting.    Musculoskeletal: Positive for back pain.  Skin: Negative for rash.  Neurological: Positive for dizziness (resolved), weakness ( resolved) and numbness (resolved). Negative for light-headedness and headaches.   Physical Exam Updated Vital Signs BP 127/86   Pulse 79   Temp 98.2 F (36.8 C) (Oral)   Resp 16   LMP 03/24/2017   SpO2 100%   Physical Exam  Constitutional: She appears well-developed and well-nourished. No distress.  Well appearing, in no acute distress  HENT:  Head: Normocephalic and atraumatic.  Mouth/Throat: Oropharynx is clear and moist.  Eyes: Pupils are equal, round, and reactive to light. Conjunctivae and  EOM are normal.  No nystagmus  Neck: Neck supple.  Cardiovascular: Normal rate and regular rhythm.  No murmur heard. Pulmonary/Chest: Effort normal and breath sounds normal. No respiratory distress.  Abdominal: Soft. Bowel sounds are normal.  Mild lower abd TTP, moreso on the left. Bilat CVA TTP. No rebound, guarding, or rigidity. Pt distractible on exam.  Musculoskeletal: She exhibits no edema.  Diffuse lumbar TTP, no focal TTP  Neurological: She is alert.  Mental Status:  Alert, thought content appropriate, able to give a coherent history. Speech fluent without evidence of aphasia. Able to follow 2 step commands without difficulty.  Cranial Nerves:  II: pupils equal, round, reactive to light III,IV, VI: ptosis not present, extra-ocular motions intact bilaterally  V,VII: smile symmetric, facial light touch sensation equal VIII: hearing grossly normal to voice  X: uvula elevates symmetrically  XI: bilateral shoulder shrug symmetric and strong XII: midline tongue extension without fassiculations Motor:  Normal tone. 5/5 strength of BUE and BLE major muscle groups including strong and equal grip strength and dorsiflexion/plantar flexion Sensory: light touch normal in all extremities. Gait: normal gait and balance.  Negative pronator drift  Skin: Skin is  warm and dry.  Psychiatric: She has a normal mood and affect.  Nursing note and vitals reviewed.   ED Treatments / Results  Labs (all labs ordered are listed, but only abnormal results are displayed) Labs Reviewed  BASIC METABOLIC PANEL - Abnormal; Notable for the following components:      Result Value   Potassium 3.1 (*)    Glucose, Bld 113 (*)    All other components within normal limits  CBC - Abnormal; Notable for the following components:   WBC 17.5 (*)    All other components within normal limits  D-DIMER, QUANTITATIVE (NOT AT [facility]) - Abnormal; Notable for the following components:   D-Dimer, Quant 0.67 (*)    All other components within normal limits  URINALYSIS, ROUTINE W REFLEX MICROSCOPIC  RAPID URINE DRUG SCREEN, HOSP PERFORMED  I-STAT BETA HCG BLOOD, ED ([facility], [facility], [facility] ONLY)  I-STAT TROPONIN, ED  CBG MONITORING, ED    EKG EKG Interpretation  Date/Time:  Monday July 02 2018 23:34:35 EST Ventricular Rate:  71 PR Interval:  152 QRS Duration: 98 QT Interval:  398 QTC Calculation: 432 R Axis:   74 Text Interpretation:  Normal sinus rhythm Cannot rule out Anterior infarct , age undetermined Abnormal ECG Confirmed by Thayer Jew [phone]) on 07/03/2018 3:46:28 AM   Radiology Dg Chest 2 View  Result Date: 07/03/2018 CLINICAL DATA:  Acute onset of syncope. Generalized back pain and numbness in hands. EXAM: CHEST - 2 VIEW COMPARISON:  Chest radiograph performed 12/23/2012 FINDINGS: The lungs are well-aerated and clear. There is no evidence of focal opacification, pleural effusion or pneumothorax. The heart is normal in size; the mediastinal contour is within normal limits. No acute osseous abnormalities are seen. IMPRESSION: No acute cardiopulmonary process seen. Electronically Signed   By: Garald Balding M.D.   On: 07/03/2018 04:08    Procedures Procedures (including critical care time)  Medications Ordered in ED Medications  iopamidol (ISOVUE-370) 76 %  injection 100 mL (has no administration in time range)  iopamidol (ISOVUE-370) 76 % injection (has no administration in time range)  sodium chloride 0.9 % bolus 1,000 mL (0 mLs Intravenous Stopped 07/03/18 0542)  acetaminophen (TYLENOL) tablet 650 mg (650 mg Oral Given 07/03/18 0433)  potassium chloride SA (K-DUR,KLOR-CON) CR tablet 40 mEq (40 mEq Oral Given 07/03/18  0603)     Initial Impression / Assessment and Plan / ED Course  I have reviewed the triage vital signs and the nursing notes.  Pertinent labs & imaging results that were available during my care of the patient were reviewed by me and considered in my medical decision making (see chart for details).   records reviewed. She has had MR lumbar spine last year on 04/2017: "1. Severe facet arthrosis at L4-L5 with widening of the joint spaces, which are fluid-filled. This may indicate a degree of segmental instability. 2. Grade 1 L4-L5 anterolisthesis, possibly due to pars interarticularis defects. This could be confirmed with dedicated the oblique radiography of the lumbar spine. 3. Small central disc protrusions at L4-L5 and L5-S1 with mild spinal canal stenosis at L4-5. No L5-S1 stenosis."   Final Clinical Impressions(s) / ED Diagnoses   Final diagnoses:  Lumbar pain   Pt here with multiple complaints.  Overall, pts exam is very reassuring. Pt very well appearing. No focal midline lumbar ttp. abd exam is non surgical. Neuro exam is wnl and cardiac exam is bening. Vitals are WNL. Not significantly orthostatic.  CBC with leukocytosis to 17. No anemia. BMP with hypokalemia to 3.1 (supplemented). Normal kidney function. Normal beta hcg. UA without leukocytes or nitrites. UDS negative.  Back pain: nonfocal diffuse lumbar tenderness. Has h/o lumbar spondylosis, suspect exacerbation of pain secondary to this. Strength and sensation are normal throuhgout. Low suspicion for cauda equina syndrome as no red flag signs or symptoms. Post void  bladder scan with 22cc. No point TTP on exam.  Syncope: pt has a h/o this and has been seen by cardiology. Given that her sxs occurred after sudden onset of back pain today suspect that this was a vasovagal episode rather than due to a cardiac cause. Doubt neurologic cause. Trop negative. EKG with NSR, no acute ischemic changes. Appears unchanged from prior. CXR with no acute cardiopulmonary etiology. No widened mediastinum and there fore doubt dissection. DDimer was ordered to r/o pe causing syncope and was slightly elevated there for CT PE ordered.   Lower abd pain and diarrhea: Diarrhea is chronic but she states that sxs are worse recently. She does have some lower abd pain. No rigidity, rebound or guarding. Lower suspicion for pelvic pathology such as PID given pt reports no concern for STD and pain is more superior. Given elevated WBC count and lower abd ttp, ct abd ordered.  CT PE and Ct abd/pelvis pending at time of shift change. Care signed out to Armstead Peaks, PA-c with plan to f/u on imaging studies. If imaging is negative patient is likely safe for discharge with close pcp f/u and good return precautions.   ED Discharge Orders    None       Rodney Booze, [place] 07/05/18 8546    Merryl Hacker, MD 07/09/18 [phone]

## 2018-07-03 NOTE — ED Provider Notes (Signed)
Signout from previous provider, Cortni Couture, PA-C at shift change See previous provider notes for full H&P  Briefly, patient with sudden onset of back pain yesterday afternoon. Hx of back pain. Intermittently numbness to legs with standing. Otherwise no red flags. Dysuria and lower abdominal pain. PVR bladder scan. Chronic diarrhea.  Lower abdomen tenderness. Reported CVA tendneress.  Passed out from severe pain. Chest pain after syncopal episode. Hx of syncope worked up by cardiology. D-dimer elevated. CT angio, abdomen pelvis pending.  PLan to d/c home if scans negative.  CT is resulted and showed progression of severe bilateral facet arthritis at L4-5 as well as progressive, now large broad-based soft disc protrusion at L4-5 creating new severe spinal stenosis. The disc protrusion almost completely obliterates the thecal sac. There are small gallstones in the gallbladder and a small, fat-containing hernia. Patient's PCP made aware of a 7x6 mm lung nodule to have follow up CT in 6-12 months.  On my exam, patient has decreased sensation with light touch to the perineal area.  Sharp touch is intact.  Her rectal tone is intact.  Patellar reflexes not reactive.  5/5 strength to bilateral lower extremities with flexion/extension at the hip, plantar and dorsiflexion/extension. Patient reporting pain in her back and in the suprapubic and perineal area when she has the urge to urinate.  Patient is ambulatory.  I watched her walk to the restroom.  I consulted with Dr. Christella Noa with neurosurgery who advised MR Lumbar spine.  MR Lumbar was conducted and found some worsening of disc protrusions but no neural compression. I spoke with Dr. Christella Noa regarding these findings and he advised follow up in his office in the next week or so for further evaluation.  Patient will be discharged home with short course of Percocet for pain control and Robaxin. Advised she can alternate with ibuprofen as well. I reviewed  the [place] narcotic database and found no discrepancies.    Frederica Kuster, PA-C 07/04/18 1950    Merryl Hacker, MD 07/04/18 [phone]

## 2018-07-03 NOTE — ED Notes (Signed)
ED Provider at bedside. 

## 2018-07-03 NOTE — Discharge Instructions (Addendum)
Take ibuprofen as prescribed over-the-counter, as needed for your pain.  For severe pain, you can take 1-2 Percocet every 6 hours.  Take Robaxin twice daily as needed for muscle pain or spasms.  Do not drive or operate machinery while taking Percocet or Robaxin.  Use ice 3-4 times daily alternating 20 minutes on, 20 minutes off.  Please call Dr. Lacy Duverney office for appointment in 1 to 2 weeks for further management of your back problems.  If you would rather, you can also follow-up with Dr. Phoebe Sharps office and a spine doctor there may be able to help you as well. please return the emergency department if you develop any complete numbness of your groin, loss of bowel or bladder control, complete numbness or weakness of your leg, or any other concerning symptoms.  Do not drink alcohol, drive, operate machinery or participate in any other potentially dangerous activities while taking opiate pain medication as it may make you sleepy. Do not take this medication with any other sedating medications, either prescription or over-the-counter. If you were prescribed Percocet or Vicodin, do not take these with acetaminophen (Tylenol) as it is already contained within these medications and overdose of Tylenol is dangerous.   This medication is an opiate (or narcotic) pain medication and can be habit forming.  Use it as little as possible to achieve adequate pain control.  Do not use or use it with extreme caution if you have a history of opiate abuse or dependence. This medication is intended for your use only - do not give any to anyone else and keep it in a secure place where nobody else, especially children, have access to it. It will also cause or worsen constipation, so you may want to consider taking an over-the-counter stool softener while you are taking this medication.

## 2018-07-03 NOTE — ED Notes (Signed)
Patient ambulatory to bathroom with steady gait at this time 

## 2018-07-03 NOTE — Progress Notes (Signed)
Patient ID: Julia Green, female   DOB: 03/02/1971, 47 y.o.   MRN: [id number] BP 115/76 (BP Location: Right Arm)   Pulse 76   Temp 98.2 F (36.8 C) (Oral)   Resp 16   LMP 03/24/2017   SpO2 98%  Films reviewed, ok for outpatient follow up. Please instruct Ms. Julia Green to call the office to make an appointment.

## 2018-07-03 NOTE — ED Notes (Signed)
Interpretor used to update pt, MRI contacted re: estimated time to scan, pt next for transport

## 2018-07-04 ENCOUNTER — Encounter (HOSPITAL_COMMUNITY): Payer: Self-pay | Admitting: Emergency Medicine

## 2018-07-17 ENCOUNTER — Encounter (INDEPENDENT_AMBULATORY_CARE_PROVIDER_SITE_OTHER): Payer: Self-pay | Admitting: Orthopaedic Surgery

## 2018-07-17 ENCOUNTER — Ambulatory Visit (INDEPENDENT_AMBULATORY_CARE_PROVIDER_SITE_OTHER): Payer: 59 | Admitting: Orthopaedic Surgery

## 2018-07-17 ENCOUNTER — Ambulatory Visit (INDEPENDENT_AMBULATORY_CARE_PROVIDER_SITE_OTHER): Payer: Self-pay

## 2018-07-17 DIAGNOSIS — M542 Cervicalgia: Secondary | ICD-10-CM | POA: Insufficient documentation

## 2018-07-17 DIAGNOSIS — M545 Low back pain, unspecified: Secondary | ICD-10-CM | POA: Insufficient documentation

## 2018-07-17 MED ORDER — PREDNISONE 10 MG (21) PO TBPK: ORAL_TABLET | ORAL | 0 refills | Status: DC

## 2018-07-17 NOTE — Progress Notes (Signed)
Office Visit Note   Patient: Julia Green           Date of Birth: 23-Mar-1971           MRN: [id number] Visit Date: 07/17/2018              Requested by: Cathleen Corti, PA-C [address] [place], [place] [id number] PCP: Cathleen Corti, PA-C   Assessment & Plan: Visit Diagnoses:  1. Right low back pain, unspecified chronicity, unspecified whether sciatica present   2. Neck pain     Plan: Impression is cervical and lumbar radiculopathy.  We will start the patient on a steroid taper and muscle relaxer.  I will go ahead and send her to formal physical therapy.  I will go ahead and refer her to Dr. Ernestina Patches as well for repeat ESI.  Follow-up with [place] as needed.  This was all discussed with her daughters who acted as the [place] interpreters as the patient spoke [place].  Follow-Up Instructions: Return if symptoms worsen or fail to improve.   Orders:  Orders Placed This Encounter  Procedures  . XR Lumbar Spine 2-3 Views  . XR Cervical Spine 2 or 3 views   Meds ordered this encounter  Medications  . predniSONE (STERAPRED UNI-PAK 21 TAB) 10 MG (21) TBPK tablet    Sig: Take as directed    Dispense:  21 tablet    Refill:  0      Procedures: No procedures performed   Clinical Data: No additional findings.   Subjective: Chief Complaint  Patient presents with  . Lower Back - Pain    HPI patient is a pleasant 47 year old female presents to our clinic today with continued lower back pain.  History of facet arthropathy at L4-5 which was injected by Dr. Ernestina Patches last year of relief.  She denies any relief of symptoms following the injection.  She has had continued bilateral lower back pain which does occasionally radiate into both legs.  She does note that she gets occasional numbness to both feet.  No new injury or change in activity.  No bowel or bladder change and no saddle paresthesias.  She is also talking about bilateral upper extremity numbness,  tingling burning as well as neck pain.  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  The numbness she has is to all 10 fingers.  Nothing seems to make this worse.  No previous cervical spine MRI.  Review of Systems as detailed in HPI.  All others reviewed and are negative   Objective: Vital Signs: LMP 03/24/2017   Physical Exam.  Well-developed and well-nourished female no acute distress.  Alert and oriented x3.  Ortho Exam examination of her lumbar spine reveals increased pain with lumbar flexion.  Positive straight leg raise both sides.  No paraspinous or spinous tenderness.  No focal weakness.  Examination of her cervical spine reveals limited motion in all planes.  No spinous or paraspinous tenderness.  She is neurovascular intact distally.  Negative Phalen's and negative Tinel.  Specialty Comments:  No specialty comments available.  Imaging: Xr Cervical Spine 2 Or 3 Views  Result Date: 07/17/2018 X-rays of the cervical spine reveal abnormal straightening with a small avulsion at C6  Xr Lumbar Spine 2-3 Views  Result Date: 07/17/2018 X-rays of the lumbar spine reveal retrolisthesis at L5    PMFS History: Patient Active Problem List   Diagnosis Date  Noted  . Right low back pain 07/17/2018  . Neck pain 07/17/2018  . Postoperative state 06/26/2017  . Rotator cuff syndrome of right shoulder 05/30/2017  . 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  . Abdominal bloating 03/22/2017  . Other specified hypothyroidism 11/16/2016  . Dysmenorrhea 07/20/2016  . 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  . Dyspareunia, female 06/16/2016   Past Medical History:  Diagnosis Date  . Arthritis   . Asthma   . Depression   . Fatty liver   . Gallstones   . GERD (gastroesophageal reflux disease)   . Headache   . History of low potassium   . Hypertension   . Hypothyroidism   . Right arm numbness   . Right leg numbness    . Sciatic nerve pain   . Thyroid disease     Family History  Problem Relation Age of Onset  . Hypertension Mother   . Uterine cancer Mother   . Cancer Sister        unknown  . Stomach cancer Maternal Aunt   . Colon cancer Neg Hx   . Esophageal cancer Neg Hx   . Rectal cancer Neg Hx     Past Surgical History:  Procedure Laterality Date  . COLONOSCOPY    . LAPAROSCOPIC VAGINAL HYSTERECTOMY WITH SALPINGECTOMY Bilateral 06/26/2017   Procedure: LAPAROSCOPIC ASSISTED VAGINAL HYSTERECTOMY WITH SALPINGECTOMY;  Surgeon: Princess Bruins, MD;  Location: [facility];  Service: Gynecology;  Laterality: Bilateral;  request 7:30am OR time  requests 2 hours Rex the lighted retractor rep will be here   . UPPER GI ENDOSCOPY     Social History   Occupational History  . Not on file  Tobacco Use  . Smoking status: Never Smoker  . Smokeless tobacco: Never Used  Substance and Sexual Activity  . Alcohol use: No  . Drug use: No  . Sexual activity: Yes    Birth control/protection: None

## 2018-07-18 ENCOUNTER — Ambulatory Visit (INDEPENDENT_AMBULATORY_CARE_PROVIDER_SITE_OTHER): Payer: 59 | Admitting: Obstetrics & Gynecology

## 2018-07-18 ENCOUNTER — Encounter: Payer: Self-pay | Admitting: Obstetrics & Gynecology

## 2018-07-18 VITALS — BP 120/80

## 2018-07-18 DIAGNOSIS — R300 Dysuria: Secondary | ICD-10-CM

## 2018-07-18 DIAGNOSIS — N898 Other specified noninflammatory disorders of vagina: Secondary | ICD-10-CM | POA: Diagnosis not present

## 2018-07-18 DIAGNOSIS — R6882 Decreased libido: Secondary | ICD-10-CM | POA: Diagnosis not present

## 2018-07-18 DIAGNOSIS — N9411 Superficial (introital) dyspareunia: Secondary | ICD-10-CM

## 2018-07-18 LAB — WET PREP FOR TRICH, YEAST, CLUE

## 2018-07-18 MED ORDER — ESTRADIOL 0.1 MG/GM VA CREA: 0.2500 | TOPICAL_CREAM | VAGINAL | 2 refills | Status: DC

## 2018-07-18 NOTE — Addendum Note (Signed)
Addended by: Precious Bard on: 07/18/2018 01:55 PM   Modules accepted: Orders

## 2018-07-18 NOTE — Progress Notes (Signed)
    Arlyne daejah klebba 19-Jan-1971 [id number]        47 y.o.  G4P4L4 married  RP: Low libido and pain with intercourse  HPI: Status post total hysterectomy.  Pain at anterior vestibule during IC.   Very low libido.  Mild hot flashes and night sweats.  No symptoms of major depression.  No pelvic pain.  Urine and bowel movements normal.   OB History  Gravida Para Term Preterm AB Living  4 4       4   SAB TAB Ectopic Multiple Live Births               # Outcome Date GA Lbr Len/2nd Weight Sex Delivery Anes PTL Lv  4 Para           3 Para           2 Para           1 Para             Past medical history,surgical history, problem list, medications, allergies, family history and social history were all reviewed and documented in the EPIC chart.   Directed ROS with pertinent positives and negatives documented in the history of present illness/assessment and plan.  Exam:  Vitals:   07/18/18 1520  BP: 120/80   General appearance:  Normal  Abdomen: Normal  Gynecologic exam: Vulva normal except for mild atrophy in the anterior vestibule where the patient is experiencing pain with intercourse.  No evidence of lichen sclerosus.  Vaginal exam normal with no pelvic mass felt.  Wet prep negative U/A negative.  No culture indicated.   Assessment/Plan:  47 y.o. G4P4   1. Low libido Probably multifactorial, but likely in perimenopause or entering menopause.  [place] drawn today.  If menopause is confirmed would recommend starting on hormone replacement therapy.  Free, total testosterone also drawn today.  Will consider Testosterone gel if low Testo. - FSH - Testos,Total,Free and SHBG (Female)  2. 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  Appears atrophic anteriorly on exam.  Will confirm with [facility] today.  Start Estradiol cream a quarter of an applicator at the anterior vulva twice a week.  Usage reviewed and prescription sent to pharmacy. - [place]  3.  Dysuria Urine analysis negative.  No culture indicated. - Urinalysis,Complete w/RFL Culture  4. Vaginal discharge Wet prep negative.  Reassured. - WET PREP FOR [place], YEAST, CLUE  Other orders - estradiol (ESTRACE) 0.1 MG/GM vaginal cream; Place 1.85 Applicatorfuls vaginally 2 (two) times a week.  Counseling on above issues and coordination of care more than 50% for 25 minutes.  Princess Bruins MD, 3:45 PM 07/18/2018

## 2018-07-19 LAB — URINALYSIS, COMPLETE W/RFL CULTURE
BILIRUBIN URINE: NEGATIVE
Bacteria, UA: NONE SEEN /HPF
GLUCOSE, UA: NEGATIVE
Hgb urine dipstick: NEGATIVE
Hyaline Cast: NONE SEEN /LPF
Ketones, ur: NEGATIVE
LEUKOCYTE ESTERASE: NEGATIVE
NITRITES URINE, INITIAL: NEGATIVE
Protein, ur: NEGATIVE
RBC / HPF: NONE SEEN /HPF (ref 0–2)
SQUAMOUS EPITHELIAL / LPF: NONE SEEN /HPF (ref <=5)
Specific Gravity, Urine: 1.02 (ref 1.001–1.03)
WBC, UA: NONE SEEN /HPF (ref 0–5)
pH: 7 (ref 5.0–8.0)

## 2018-07-19 LAB — FOLLICLE STIMULATING HORMONE: FSH: 32.6 m[IU]/mL

## 2018-07-19 LAB — NO CULTURE INDICATED

## 2018-07-20 ENCOUNTER — Encounter: Payer: Self-pay | Admitting: Obstetrics & Gynecology

## 2018-07-20 NOTE — Patient Instructions (Addendum)
1. Low libido Probably multifactorial, but likely in perimenopause or entering menopause.  [place] drawn today.  If menopause is confirmed would recommend starting on hormone replacement therapy.  Free, total testosterone also drawn today.  Will consider Testosterone gel if low Testo. - FSH - Testos,Total,Free and SHBG (Female)  2. 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  Appears atrophic anteriorly on exam.  Will confirm with [facility] today.  Start Estradiol cream a quarter of an applicator at the anterior vulva twice a week.  Usage reviewed and prescription sent to pharmacy. - [place]  3. Dysuria Urine analysis negative.  No culture indicated. - Urinalysis,Complete w/RFL Culture  4. Vaginal discharge Wet prep negative.  Reassured. - WET PREP FOR [place], YEAST, CLUE  Other orders - estradiol (ESTRACE) 0.1 MG/GM vaginal cream; Place 7.86 Applicatorfuls vaginally 2 (two) times a week.  [place], fue un placer verle hoy!  Voy a informarle de sus [employer].

## 2018-07-25 LAB — TESTOS,TOTAL,FREE AND SHBG (FEMALE): Sex Hormone Binding: 55 nmol/L (ref 17–124)

## 2018-07-30 ENCOUNTER — Encounter: Payer: Self-pay | Admitting: Physical Therapy

## 2018-07-30 ENCOUNTER — Other Ambulatory Visit: Payer: Self-pay

## 2018-07-30 ENCOUNTER — Ambulatory Visit: Payer: 59 | Attending: Physician Assistant | Admitting: Physical Therapy

## 2018-07-30 DIAGNOSIS — M542 Cervicalgia: Secondary | ICD-10-CM | POA: Diagnosis not present

## 2018-07-30 DIAGNOSIS — R293 Abnormal posture: Secondary | ICD-10-CM | POA: Diagnosis present

## 2018-07-30 DIAGNOSIS — M5412 Radiculopathy, cervical region: Secondary | ICD-10-CM | POA: Insufficient documentation

## 2018-07-30 DIAGNOSIS — M6281 Muscle weakness (generalized): Secondary | ICD-10-CM

## 2018-07-30 DIAGNOSIS — M5416 Radiculopathy, lumbar region: Secondary | ICD-10-CM | POA: Insufficient documentation

## 2018-07-30 NOTE — Therapy (Signed)
[place], [place], [address] Phone: [phone]   Fax:  [phone]  Physical Therapy Evaluation  Patient Details  Name: Julia Green MRN: [id number] Date of Birth: [birth year] Referring Provider (PT): Tiney Rouge, [place]   Encounter Date: 07/30/2018  PT End of Session - 07/30/18 1741    Visit Number  1    Number of Visits  8    Date for PT Re-Evaluation  08/27/18    Authorization Type  [employer]-60 visit limit    PT Start Time  5465    PT Stop Time  1505    PT Time Calculation (min)  47 min    Activity Tolerance  Patient tolerated treatment well    Behavior During Therapy  [facility] for tasks assessed/performed       Past Medical History:  Diagnosis Date  . Arthritis   . Asthma   . Depression   . Fatty liver   . Gallstones   . GERD (gastroesophageal reflux disease)   . Headache   . History of low potassium   . Hypertension   . Hypothyroidism   . Right arm numbness   . Right leg numbness   . Sciatic nerve pain   . Thyroid disease     Past Surgical History:  Procedure Laterality Date  . COLONOSCOPY    . LAPAROSCOPIC VAGINAL HYSTERECTOMY WITH SALPINGECTOMY Bilateral 06/26/2017   Procedure: LAPAROSCOPIC ASSISTED VAGINAL HYSTERECTOMY WITH SALPINGECTOMY;  Surgeon: Princess Bruins, MD;  Location: [facility];  Service: Gynecology;  Laterality: Bilateral;  request 7:30am OR time  requests 2 hours Rex the lighted retractor rep will be here   . UPPER GI ENDOSCOPY      There were no vitals filed for this visit.   Subjective Assessment - 07/30/18 1641    Subjective  Pt. reports 5-6 year history of neck and bilateral upper extremity pain including pain extending from shoulders into bilat. arms and hands with hand parasthesias and difficulty with gripping activities on right>left side. Pt. reports tried therapy closer to onset of symptoms with no relief. Pt. referred to PT to try therapy before  proceeding with MRI. Pt. denies bowel or bladder changes. or any issues with balance.    Patient is accompained by:  Interpreter    Pertinent History  5-6 year history symptoms, also has lumbar radiculopathy    Limitations  Sitting;Lifting;Standing;House hold activities   lifting, work duties   How long can you sit comfortably?  unable comfortably    How long can you stand comfortably?  unable comfortably    How long can you walk comfortably?  no limitations    Diagnostic tests  X-rays    Patient Stated Goals  Does not know-did not want to come to therapy    Currently in Pain?  Yes    Pain Score  5     Pain Location  Neck    Pain Orientation  Right;Left    Pain Descriptors / Indicators  Stabbing   "horrible", "inside bones"   Pain Type  Chronic pain    Pain Onset  More than a month ago    Pain Frequency  Constant    Aggravating Factors   everything, no specific aggravating factors noted    Pain Relieving Factors  no eases noted    Effect of Pain on Daily Activities  limits positional tolerance and ability for lifting, gripping activities         [facility] PT Assessment -  07/30/18 0001      Assessment   Medical Diagnosis  Cervical pain/suspected radiculopathy    Referring Provider (PT)  Tiney Rouge, PA-C    Onset Date/Surgical Date  07/30/13   estimated per report of onset 5-6 years ago   Hand Dominance  Right    Prior Therapy  Past PT several years ago      Precautions   Precautions  None      Restrictions   Weight Bearing Restrictions  No      Balance Screen   Has the patient fallen in the past 6 months  No      [place] residence    Living Arrangements  Spouse/significant other      Prior Function   Level of Independence  Independent with basic ADLs    Vocation  --   works in Sports [employer]   Overall Cognitive Status  Within Functional Limits for tasks assessed      Observation/Other Assessments   Focus on  Therapeutic Outcomes (FOTO)   66% limited      Sensation   Additional Comments  dermatomal screen C5-T1 grossly intact but reports symptoms of bilat. hand parasthesias      Posture/Postural Control   Posture/Postural Control  Postural limitations    Posture Comments  forward head, increased upper thoracic kyphosi      ROM / Strength   AROM / PROM / Strength  AROM;Strength      AROM   AROM Assessment Site  Cervical    Cervical Flexion  50    Cervical Extension  30    Cervical - Right Side Bend  20    Cervical - Left Side Bend  32    Cervical - Right Rotation  80    Cervical - Left Rotation  80      Strength   Overall Strength Comments  R grip 47 lbs., L grip 52 lbs. with grip dynamonometer    Strength Assessment Site  Shoulder;Elbow;Wrist;Hand    Right/Left Shoulder  Right;Left    Right Shoulder Flexion  4+/5    Right Shoulder ABduction  5/5    Right Shoulder Internal Rotation  4+/5    Right Shoulder External Rotation  4+/5    Left Shoulder Flexion  4+/5    Left Shoulder ABduction  5/5    Left Shoulder Internal Rotation  4+/5    Left Shoulder External Rotation  4+/5    Right/Left Elbow  Right;Left    Right Elbow Flexion  4+/5    Right Elbow Extension  4+/5    Left Elbow Flexion  4+/5    Left Elbow Extension  4+/5    Right/Left Wrist  Right;Left    Right Wrist Flexion  4+/5    Right Wrist Extension  4+/5    Left Wrist Flexion  4+/5    Left Wrist Extension  5/5      Special Tests   Other special tests  Attempted Spurling's-radicular symptoms present prior to test-no exacerbation or peripheralization of symptoms noted, upper limb tension tests for median, radial, and ulnar nerve (-) bilaterally, cervical distraction test (-) for any symptom change                Objective measurements completed on examination: See above findings.      [facility] Adult PT Treatment/Exercise - 07/30/18 0001      Exercises   Exercises  Neck  Neck Exercises: Seated   Neck  Retraction  --   HEP instruction and brief practice      Modalities   Modalities  Moist Heat      Moist Heat Therapy   Number Minutes Moist Heat  10 Minutes    Moist Heat Location  Cervical             PT Education - 07/30/18 1740    Education Details  POC, brief HEP, cervical anatomy/potential etiology symptoms    Person(s) Educated  Patient    Methods  Explanation;Handout;Demonstration;Verbal cues    Comprehension  Verbalized understanding;Returned demonstration          PT Long Term Goals - 07/30/18 2016      PT LONG TERM GOAL #1   Title  Independent with HEP    Baseline  no HEP    Time  4    Period  Weeks    Status  New    Target Date  08/27/18      PT LONG TERM GOAL #2   Title  Centralize bilat. UE pain/parasthesias to elbows or more proximally for performance of work duties and gripping activities without hand pain    Baseline  constant symptoms distally to hands    Time  4    Period  Weeks    Status  New    Target Date  08/27/18      PT LONG TERM GOAL #3   Title  Improve FOTO score to 47% or less limited    Baseline  66% limited    Time  4    Period  Weeks    Status  New    Target Date  08/27/18      PT LONG TERM GOAL #4   Title  Bilat. UE strength grossly 5/5 to improve ability for lifting activities for work, chores    Baseline  see objective for specifics-grossly 4+/5    Time  4    Period  Weeks    Status  New    Target Date  08/27/18             Plan - 07/30/18 1742    Clinical Impression Statement  Pt. presents with cervical pain and bilateral upper extremity pain, parasthesias, and decreased strength which could be consistent with radiculopathy. Unable to determine any directional preference with cervical ROM or mechanical relation to special tests. Plan trial of therapy to see if improvement can be obtained but if no significant changes then would recommend follow up with referring provider for further assessment.    History and  Personal Factors relevant to plan of care:  multiple years of symptoms, potential radicular etiology, bilateral UE symptoms, past PT without improvement, history lumbar radiculopathy    Clinical Presentation  Evolving    Clinical Presentation due to:  potential radicular etiology, no clear directional preference/clinical tests or aggs/eases to correlate with symptoms    Clinical Decision Making  Moderate    Rehab Potential  Fair    Clinical Impairments Affecting Rehab Potential  upper extremity weakness with bilateral radicular symptoms    PT Frequency  2x / week    PT Duration  4 weeks    PT Treatment/Interventions  ADLs/Self Care Home Management;Cryotherapy;Electrical Stimulation;Ultrasound;Traction;Moist Heat;Therapeutic activities;Therapeutic exercise;Neuromuscular re-education;Patient/family education;Manual techniques;Dry needling;Taping    PT Next Visit Plan  Trial gentle traction manual vs. mechanical, continue retractions-plan trial flexion cervical ROM, postural strengthening, STM, modalities prn for pain    PT Home  Exercise Plan  cervical retractions-will add further exercises pending tolerance    Consulted and Agree with Plan of Care  Patient       Patient will benefit from skilled therapeutic intervention in order to improve the following deficits and impairments:  Pain, Impaired sensation, Decreased strength, Impaired UE functional use, Postural dysfunction, Decreased activity tolerance  Visit Diagnosis: Cervicalgia  Muscle weakness (generalized)  Abnormal posture     Problem List Patient Active Problem List   Diagnosis Date Noted  . Right low back pain 07/17/2018  . Neck pain 07/17/2018  . Postoperative state 06/26/2017  . Rotator cuff syndrome of right shoulder 05/30/2017  . 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  . Abdominal bloating 03/22/2017  . Other specified hypothyroidism 11/16/2016  . Dysmenorrhea 07/20/2016  .  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  . Dyspareunia, female 06/16/2016    Beaulah Dinning, PT, DPT 07/30/18 8:24 PM  [facility] [address] [place], [place], [address] Phone: [phone]   Fax:  [phone]  Name: Julia Green MRN: [id number] Date of Birth: 1970-10-30

## 2018-08-07 ENCOUNTER — Ambulatory Visit: Payer: 59 | Admitting: Physical Therapy

## 2018-08-07 DIAGNOSIS — M542 Cervicalgia: Secondary | ICD-10-CM

## 2018-08-07 DIAGNOSIS — R293 Abnormal posture: Secondary | ICD-10-CM

## 2018-08-07 DIAGNOSIS — M6281 Muscle weakness (generalized): Secondary | ICD-10-CM

## 2018-08-07 NOTE — Therapy (Signed)
[place], [place], [address] Phone: [phone]   Fax:  [phone]  Physical Therapy Treatment  Patient Details  Name: Nilaya Bouie MRN: [id number] Date of Birth: Jun 24, 1971 Referring Provider (PT): Tiney Rouge, [place]   Encounter Date: 08/07/2018  PT End of Session - 08/07/18 1507    Visit Number  2    Number of Visits  8    Date for PT Re-Evaluation  08/27/18    Authorization Type  UHC-60 visit limit    PT Start Time  0300    PT Stop Time  0355    PT Time Calculation (min)  55 min       Past Medical History:  Diagnosis Date  . Arthritis   . Asthma   . Depression   . Fatty liver   . Gallstones   . GERD (gastroesophageal reflux disease)   . Headache   . History of low potassium   . Hypertension   . Hypothyroidism   . Right arm numbness   . Right leg numbness   . Sciatic nerve pain   . Thyroid disease     Past Surgical History:  Procedure Laterality Date  . COLONOSCOPY    . LAPAROSCOPIC VAGINAL HYSTERECTOMY WITH SALPINGECTOMY Bilateral 06/26/2017   Procedure: LAPAROSCOPIC ASSISTED VAGINAL HYSTERECTOMY WITH SALPINGECTOMY;  Surgeon: Princess Bruins, MD;  Location: [facility];  Service: Gynecology;  Laterality: Bilateral;  request 7:30am OR time  requests 2 hours Rex the lighted retractor rep will be here   . UPPER GI ENDOSCOPY      There were no vitals filed for this visit.  Subjective Assessment - 08/07/18 1512    Subjective  Neck is the same,     Currently in Pain?  Yes    Pain Score  6     Pain Location  Neck    Pain Orientation  Posterior;Right;Left    Pain Descriptors / Indicators  Constant    Pain Type  Chronic pain    Pain Radiating Towards  bilateral arms    Aggravating Factors   down the stairs     Pain Relieving Factors  none                       [facility] Adult PT Treatment/Exercise - 08/07/18 0001      Neck Exercises: Seated   Neck  Retraction  10 reps    Postural Training  Postural demonstration and return demonstration    Other Seated Exercise   scap retractions       Neck Exercises: Supine   Other Supine Exercise  chin tuck and abdominal draw in with alternating forward raise of UE     Other Supine Exercise  Decompression series head press, shoulder press, leg lengthener, leg press       Moist Heat Therapy   Number Minutes Moist Heat  10 Minutes    Moist Heat Location  Cervical      Manual Therapy   Manual therapy comments  bilateral upper trap STW and PROM side bend and rotation             PT Education - 08/07/18 1530    Education Details  HEP-decompression     Person(s) Educated  Patient    Methods  Explanation;Handout    Comprehension  Verbalized understanding          PT Long Term Goals - 07/30/18 2016  PT LONG TERM GOAL #1   Title  Independent with HEP    Baseline  no HEP    Time  4    Period  Weeks    Status  New    Target Date  08/27/18      PT LONG TERM GOAL #2   Title  Centralize bilat. UE pain/parasthesias to elbows or more proximally for performance of work duties and gripping activities without hand pain    Baseline  constant symptoms distally to hands    Time  4    Period  Weeks    Status  New    Target Date  08/27/18      PT LONG TERM GOAL #3   Title  Improve FOTO score to 47% or less limited    Baseline  66% limited    Time  4    Period  Weeks    Status  New    Target Date  08/27/18      PT LONG TERM GOAL #4   Title  Bilat. UE strength grossly 5/5 to improve ability for lifting activities for work, chores    Baseline  see objective for specifics-grossly 4+/5    Time  4    Period  Weeks    Status  New    Target Date  08/27/18            Plan - 08/07/18 1545    Clinical Impression Statement  Pt reports no change. We reviewed HEp and progressed with decompression series. She reports pain with all therex. Encouraged gentle motions. Supine position  uncomfortable. No change in UE sx with manual cervical distracton. Soft tissue work to bilateral upper traps. She reports using HMP and daughter's massage upper traps with limited benefit. HMP at end of session today.     PT Next Visit Plan  Referral in media tab is for neck and back? Are we treating both? Assess response to decompression this visit; Trial gentle traction manual vs. mechanical, continue retractions-plan trial flexion cervical ROM, postural strengthening, STM, modalities prn for pain    PT Home Exercise Plan  cervical retractions-will add further exercises pending tolerance, decompression series     Consulted and Agree with Plan of Care  Patient       Patient will benefit from skilled therapeutic intervention in order to improve the following deficits and impairments:  Pain, Impaired sensation, Decreased strength, Impaired UE functional use, Postural dysfunction, Decreased activity tolerance  Visit Diagnosis: Cervicalgia  Muscle weakness (generalized)  Abnormal posture     Problem List Patient Active Problem List   Diagnosis Date Noted  . Right low back pain 07/17/2018  . Neck pain 07/17/2018  . Postoperative state 06/26/2017  . Rotator cuff syndrome of right shoulder 05/30/2017  . 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  . Abdominal bloating 03/22/2017  . Other specified hypothyroidism 11/16/2016  . Dysmenorrhea 07/20/2016  . 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  . Dyspareunia, female 06/16/2016    Dorene Ar, PTA 08/07/2018, 4:23 PM  [facility] [address] [place], [place], [address] Phone: [phone]   Fax:  [phone]  Name: Paislynn Hegstrom MRN: [id number] Date of Birth: [birth year]

## 2018-08-07 NOTE — Patient Instructions (Signed)
Pt given decompression Series from cabinet. Head press, shoulder press, leg lengthener, leg press. 5 reps , 2 sets each.

## 2018-08-13 ENCOUNTER — Encounter: Payer: Self-pay | Admitting: Physical Therapy

## 2018-08-13 ENCOUNTER — Ambulatory Visit: Payer: 59 | Admitting: Physical Therapy

## 2018-08-13 DIAGNOSIS — M542 Cervicalgia: Secondary | ICD-10-CM

## 2018-08-13 DIAGNOSIS — M6281 Muscle weakness (generalized): Secondary | ICD-10-CM

## 2018-08-13 DIAGNOSIS — R293 Abnormal posture: Secondary | ICD-10-CM

## 2018-08-13 NOTE — Therapy (Signed)
[place], [place], [address] Phone: [phone]   Fax:  [phone]  Physical Therapy Treatment  Patient Details  Name: Julia Green MRN: [id number] Date of Birth: 27-Oct-1970 Referring Provider (PT): Tiney Rouge, [place]   Encounter Date: 08/13/2018  PT End of Session - 08/13/18 1726    Visit Number  3    Number of Visits  8    Date for PT Re-Evaluation  08/27/18    Authorization Type  UHC-60 visit limit    PT Start Time  1500    PT Stop Time  1545    PT Time Calculation (min)  45 min    Activity Tolerance  --   session well-tolerated but no change in symptoms   Behavior During Therapy  [facility] for tasks assessed/performed       Past Medical History:  Diagnosis Date  . Arthritis   . Asthma   . Depression   . Fatty liver   . Gallstones   . GERD (gastroesophageal reflux disease)   . Headache   . History of low potassium   . Hypertension   . Hypothyroidism   . Right arm numbness   . Right leg numbness   . Sciatic nerve pain   . Thyroid disease     Past Surgical History:  Procedure Laterality Date  . COLONOSCOPY    . LAPAROSCOPIC VAGINAL HYSTERECTOMY WITH SALPINGECTOMY Bilateral 06/26/2017   Procedure: LAPAROSCOPIC ASSISTED VAGINAL HYSTERECTOMY WITH SALPINGECTOMY;  Surgeon: Princess Bruins, MD;  Location: [facility];  Service: Gynecology;  Laterality: Bilateral;  request 7:30am OR time  requests 2 hours Rex the lighted retractor rep will be here   . UPPER GI ENDOSCOPY      There were no vitals filed for this visit.  Subjective Assessment - 08/13/18 1716    Subjective  Still no changes. Pt. reports continued neck pain with radiating pain and parasthesias into bilat. hands including c/o hand weakness.    Patient is accompained by:  Interpreter    Currently in Pain?  Yes    Pain Score  8     Pain Location  Neck    Pain Orientation  Posterior;Left;Right    Pain Descriptors /  Indicators  Constant;Stabbing    Pain Type  Chronic pain    Pain Radiating Towards  bilat. arms and hands    Pain Onset  More than a month ago    Pain Frequency  Constant    Aggravating Factors   no specific aggs noted    Pain Relieving Factors  none    Effect of Pain on Daily Activities  limits tolerance ADLs, difficulty with gripping and lifting activities due to c/o hand weakness         [facility] PT Assessment - 08/13/18 0001      AROM   Cervical Flexion  25    Cervical Extension  20    Cervical - Right Side Bend  23    Cervical - Left Side Bend  20    Cervical - Right Rotation  80    Cervical - Left Rotation  70                   [facility] Adult PT Treatment/Exercise - 08/13/18 0001      Neck Exercises: Supine   Neck Retraction  15 reps   with gentle OP   Other Supine Exercise  supine retraction to assisted flexion x 15 for trial  flexion bias ROM      Modalities   Modalities  Electrical Stimulation;Moist Heat      Moist Heat Therapy   Number Minutes Moist Heat  10 Minutes    Moist Heat Location  Cervical      Electrical Stimulation   Electrical Stimulation Location  --   Cervical/upper trapezius region   Electrical Stimulation Action  IFC    Electrical Stimulation Parameters  80-150 HZ    Electrical Stimulation Goals  Pain      Manual Therapy   Manual Therapy  Soft tissue mobilization;Myofascial release;Manual Traction    Soft tissue mobilization  cervical paraspinals and upper trap/levator region bilat. in sitting    Myofascial Release  suboccipital release    Manual Traction  supine cervical manual traction      Neck Exercises: Stretches   Upper Trapezius Stretch  Right;Left;2 reps;30 seconds   supine manual stretches   Levator Stretch  Right;Left;2 reps;30 seconds   supine manual stretch   Neural Stretch  median and ulnar nerve glides x 10 ea. bilat.             PT Education - 08/13/18 1725    Education Details  HEP updates, POC     Person(s) Educated  Patient    Methods  Explanation;Handout    Comprehension  Verbalized understanding;Returned demonstration          PT Long Term Goals - 08/13/18 1732      PT LONG TERM GOAL #1   Title  Independent with HEP    Baseline  updated today    Time  4    Period  Weeks    Status  On-going      PT LONG TERM GOAL #2   Title  Centralize bilat. UE pain/parasthesias to elbows or more proximally for performance of work duties and gripping activities without hand pain    Baseline  still with constant symptoms distally to hands    Time  4    Period  Weeks    Status  New      PT LONG TERM GOAL #3   Title  Improve FOTO score to 47% or less limited    Baseline  66% limited at eval, not retested    Time  4    Period  Weeks    Status  New      PT LONG TERM GOAL #4   Title  Bilat. UE strength grossly 5/5 to improve ability for lifting activities for work, chores    Baseline  see objective for specifics-grossly 4+/5 at eval (not retested today)    Time  4    Period  Weeks            Plan - 08/13/18 1726    Clinical Impression Statement  No mechanical response to directional exercises or manual traction to change or relieve radicular symptoms. Trial estim with MHP today to help with pain relief-will await further tx. response. Pending tolerance potential trial mechanical traction next session. Given lack of tx. response if no changes or improvement in the next 1-2 visits tentatively plan hold off on more therapy/follow up with referring provider for further assessment.    Rehab Potential  Fair    Clinical Impairments Affecting Rehab Potential  upper extremity weakness with bilateral radicular symptoms    PT Frequency  2x / week    PT Duration  4 weeks    PT Treatment/Interventions  ADLs/Self Care Home Management;Cryotherapy;Electrical Stimulation;Ultrasound;Traction;Moist Heat;Therapeutic  activities;Therapeutic exercise;Neuromuscular re-education;Patient/family  education;Manual techniques;Dry needling;Taping    PT Next Visit Plan  potential trial mechanical traction vs. continued manual traction, cervical ROM/continue as needed pending directional preference, further modalities prn.    PT Home Exercise Plan  cervical retractions, decompression series, upper trap and levator stretches    Consulted and Agree with Plan of Care  Patient       Patient will benefit from skilled therapeutic intervention in order to improve the following deficits and impairments:  Pain, Impaired sensation, Decreased strength, Impaired UE functional use, Postural dysfunction, Decreased activity tolerance  Visit Diagnosis: Cervicalgia  Muscle weakness (generalized)  Abnormal posture     Problem List Patient Active Problem List   Diagnosis Date Noted  . Right low back pain 07/17/2018  . Neck pain 07/17/2018  . Postoperative state 06/26/2017  . Rotator cuff syndrome of right shoulder 05/30/2017  . 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  . Abdominal bloating 03/22/2017  . Other specified hypothyroidism 11/16/2016  . Dysmenorrhea 07/20/2016  . 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  . Dyspareunia, female 06/16/2016    Beaulah Dinning, PT, DPT 08/13/18 5:36 PM  [facility] [address] [place], [place], [address] Phone: [phone]   Fax:  [phone]  Name: Aleicia Kenagy MRN: [id number] Date of Birth: February 16, 1971

## 2018-08-16 ENCOUNTER — Telehealth: Payer: Self-pay | Admitting: Physical Therapy

## 2018-08-16 ENCOUNTER — Ambulatory Visit: Payer: 59 | Admitting: Physical Therapy

## 2018-08-16 NOTE — Telephone Encounter (Signed)
Spoke with interpreter- forgot about the appointment and will attend next scheduled appointment. Julia Green Julia Green PT, DPT 08/16/18 6:01 PM

## 2018-08-20 ENCOUNTER — Ambulatory Visit: Payer: 59 | Admitting: Physical Therapy

## 2018-08-20 ENCOUNTER — Encounter: Payer: Self-pay | Admitting: Physical Therapy

## 2018-08-20 DIAGNOSIS — R293 Abnormal posture: Secondary | ICD-10-CM

## 2018-08-20 DIAGNOSIS — M5412 Radiculopathy, cervical region: Secondary | ICD-10-CM

## 2018-08-20 DIAGNOSIS — M6281 Muscle weakness (generalized): Secondary | ICD-10-CM

## 2018-08-20 DIAGNOSIS — M5416 Radiculopathy, lumbar region: Secondary | ICD-10-CM

## 2018-08-20 DIAGNOSIS — M542 Cervicalgia: Secondary | ICD-10-CM | POA: Diagnosis not present

## 2018-08-20 NOTE — Addendum Note (Signed)
Addended by: Beaulah Dinning S on: 08/20/2018 08:10 PM   Modules accepted: Orders

## 2018-08-20 NOTE — Therapy (Signed)
[place], [place], [address] Phone: [phone]   Fax:  [phone]  Physical Therapy Treatment (Including Lumbar Assessment)/Discharge  Patient Details  Name: Julia Green MRN: [id number] Date of Birth: December 10, 1970 Referring Provider (PT): Tiney Rouge, [place]   Encounter Date: 08/20/2018  PT End of Session - 08/20/18 1953    Visit Number  4    Number of Visits  8    Date for PT Re-Evaluation  08/27/18    Authorization Type  UHC-60 visit limit    PT Start Time  1502    PT Stop Time  1555    PT Time Calculation (min)  53 min    Activity Tolerance  Patient tolerated treatment well    Behavior During Therapy  [facility] for tasks assessed/performed       Past Medical History:  Diagnosis Date  . Arthritis   . Asthma   . Depression   . Fatty liver   . Gallstones   . GERD (gastroesophageal reflux disease)   . Headache   . History of low potassium   . Hypertension   . Hypothyroidism   . Right arm numbness   . Right leg numbness   . Sciatic nerve pain   . Thyroid disease     Past Surgical History:  Procedure Laterality Date  . COLONOSCOPY    . LAPAROSCOPIC VAGINAL HYSTERECTOMY WITH SALPINGECTOMY Bilateral 06/26/2017   Procedure: LAPAROSCOPIC ASSISTED VAGINAL HYSTERECTOMY WITH SALPINGECTOMY;  Surgeon: Princess Bruins, MD;  Location: [facility];  Service: Gynecology;  Laterality: Bilateral;  request 7:30am OR time  requests 2 hours Rex the lighted retractor rep will be here   . UPPER GI ENDOSCOPY      There were no vitals filed for this visit.  Subjective Assessment - 08/20/18 1608    Subjective  Pt. reports still no improvement or changes with neck and UE radiating symptoms. Tx. thus far had focused on neck as primary region of complaint but PT Rx. includes lumbar region as well, pt. agreeable to have this area assessed and try HEP but due to continued pain wishes to d/c today-she is  scheduled for lumbar ESI next week and will follow up with MD for status with neck given continued symptoms. For lumbar region pt. reports multiple year history of symptoms including lumbar and bilateral LE radiating pain and numbness. Pain/symptoms worse with sitting and standing/walking but seem to be the worst with standing.    Patient is accompained by:  Interpreter    Pertinent History  5-6 year history symptoms, also has lumbar radiculopathy    Limitations  Sitting;Lifting;Standing;House hold activities    How long can you sit comfortably?  unable comfortably    How long can you stand comfortably?  unable comfortably    How long can you walk comfortably?  no limitations due to neck but has limited tolerance at times due to lumbar region    Diagnostic tests  X-rays for neck, lumbar MRI-per report showed severe facet degeneration, mild-mod stenosis, multiple level disc bulges, also has spondylolisthesis    Patient Stated Goals  Does not know-did not want to come to therapy    Currently in Pain?  Yes    Pain Score  6     Pain Location  Neck    Pain Orientation  Posterior;Left;Right    Pain Descriptors / Indicators  Constant;Stabbing    Pain Type  Chronic pain    Pain Radiating Towards  bilat.  arms and hands    Pain Onset  More than a month ago    Pain Frequency  Constant    Aggravating Factors   no aggs noted    Pain Relieving Factors  none    Effect of Pain on Daily Activities  limits tolerance ADLs/IADLs, difficulty with gripping and lifting activites    Multiple Pain Sites  Yes    Pain Score  6    Pain Location  Back    Pain Orientation  Lower    Pain Descriptors / Indicators  Sharp    Pain Type  Chronic pain    Pain Radiating Towards  bilat. legs and feet    Pain Onset  More than a month ago    Pain Frequency  Constant    Aggravating Factors   standing, walking, sitting    Pain Relieving Factors  no specific eases noted    Effect of Pain on Daily Activities  limits positional  tolerance for standing and sitting, difficulty prolonged walking         [facility] PT Assessment - 08/20/18 0001      Observation/Other Assessments   Focus on Therapeutic Outcomes (FOTO)   47% limited      Sensation   Light Touch  --   no deficits noted (LE checked today for lumbar region)     AROM   AROM Assessment Site  Cervical;Lumbar    Cervical Flexion  42    Cervical Extension  40    Cervical - Right Side Bend  35    Cervical - Left Side Bend  27    Cervical - Right Rotation  74    Cervical - Left Rotation  60    Lumbar Flexion  75    Lumbar Extension  30    Lumbar - Right Side Bend  24    Lumbar - Left Side Bend  30    Lumbar - Right Rotation  WFL    Lumbar - Left Rotation  [facility]      Strength   Overall Strength Comments  Bilat. LE seated MMTs grossly 5/5, Bilat. UE grossly 4+/5 to 5/5      Special Tests   Other special tests  SLR (-) bilat.                   [facility] Adult PT Treatment/Exercise - 08/20/18 0001      Exercises   Exercises  Lumbar      Neck Exercises: Supine   Neck Retraction  15 reps    Other Supine Exercise  retraction to assisted flexion x 15      Lumbar Exercises: Stretches   Passive Hamstring Stretch  Right;Left;3 reps;30 seconds    Single Knee to Chest Stretch  Right;Left;5 reps;10 seconds      Lumbar Exercises: Supine   Pelvic Tilt  10 reps    Bent Knee Raise  10 reps    Bridge  10 reps      Modalities   Modalities  Traction      Moist Heat Therapy   Number Minutes Moist Heat  10 Minutes    Moist Heat Location  Cervical      Traction   Type of Traction  Cervical   static   Max (lbs)  10    Time  10      Neck Exercises: Stretches   Upper Trapezius Stretch  Left;3 reps;30 seconds  PT Education - 08/20/18 1953    Education Details  HEP (lumbar), POC    Person(s) Educated  Patient    Methods  Explanation;Demonstration;Verbal cues;Handout    Comprehension  Verbalized understanding;Returned  demonstration          PT Long Term Goals - 08/20/18 2001      PT LONG TERM GOAL #1   Title  Independent with HEP (updated today to include lumbar region)    Baseline  met    Time  4    Period  Weeks    Status  Achieved      PT LONG TERM GOAL #2   Title  Centralize bilat. UE pain/parasthesias to elbows or more proximally for performance of work duties and gripping activities without hand pain    Baseline   no improvement    Time  4    Period  Weeks    Status  Not Met      PT LONG TERM GOAL #3   Title  Improve FOTO score to 47% or less limited    Baseline  47%-met    Time  4    Period  Weeks    Status  Achieved      PT LONG TERM GOAL #4   Title  Bilat. UE strength grossly 5/5 to improve ability for lifting activities for work, chores    Baseline  not met,     Time  4    Period  Weeks    Status  Not Met            Plan - 08/20/18 1954    Clinical Impression Statement  No changes or improvements for neck/UE radicular symptoms despite tx. efforts including manual/mechanical traction and attempts directional preference exercises. Assessed lumbar region today-no clear directional preference and per imaging status complicated with combination severe facet degeneration along with disc bulges, spondlyolisthesis and stenosis. Given symptoms worse with standing along with factors including tolerance for flexion ROM during assessment, (-) SLR and imaging findings instructed in trial mat-based flexion bias exercises and core strengthening. Plan given lack of improvement is to d/c today/per pt. preference and pt. will follow up with referring provider for further assessment and treatment options as needed.    Clinical Presentation due to:  lack of mechanical/directional response to symptoms for neck and lumbar region    Clinical Decision Making  Moderate    Clinical Impairments Affecting Rehab Potential  upper extremity weakness with bilateral radicular symptoms    PT Frequency  --    d/c after today's visit   PT Duration  --   NA   PT Treatment/Interventions  ADLs/Self Care Home Management;Cryotherapy;Electrical Stimulation;Ultrasound;Traction;Moist Heat;Therapeutic activities;Therapeutic exercise;Neuromuscular re-education;Patient/family education;Manual techniques;Dry needling;Taping    PT Next Visit Plan  NA    PT Home Exercise Plan  cervical retractions, decompression series, upper trap and levator stretches, see pt. instructions for lumbar exercises    Consulted and Agree with Plan of Care  Patient       Patient will benefit from skilled therapeutic intervention in order to improve the following deficits and impairments:  Pain, Impaired sensation, Decreased strength, Impaired UE functional use, Postural dysfunction, Decreased activity tolerance  Visit Diagnosis: Cervicalgia  Radiculopathy, cervical region  Radiculopathy, lumbar region  Muscle weakness (generalized)  Abnormal posture     Problem List Patient Active Problem List   Diagnosis Date Noted  . Right low back pain 07/17/2018  . Neck pain 07/17/2018  . Postoperative state 06/26/2017  .  Rotator cuff syndrome of right shoulder 05/30/2017  . 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  . Abdominal bloating 03/22/2017  . Other specified hypothyroidism 11/16/2016  . Dysmenorrhea 07/20/2016  . 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  . Dyspareunia, female 06/16/2016       PHYSICAL THERAPY DISCHARGE SUMMARY  Visits from Start of Care: 4  Current functional level related to goals / functional outcomes: Pt. Continues with cervical/UE radicular pain and associated limitations without improvement, limitations from lumbar region for prolonged standing, sitting, walking   Remaining deficits: UE weakness, functional limitations for gripping and lifting activities, limitations for positional tolerance   Education / Equipment: HEP, POC, spine anatomy  Plan: Patient agrees  to discharge.  Patient goals were not met. Patient is being discharged due to lack of progress.  ?????            Beaulah Dinning, PT, DPT 08/20/18 8:07 PM   [place] [facility] [address] [place], [place], [address] Phone: [phone]   Fax:  [phone]  Name: Ariane Ditullio MRN: [id number] Date of Birth: Dec 21, 1970

## 2018-08-21 ENCOUNTER — Ambulatory Visit (INDEPENDENT_AMBULATORY_CARE_PROVIDER_SITE_OTHER): Payer: 59 | Admitting: Obstetrics & Gynecology

## 2018-08-21 ENCOUNTER — Encounter: Payer: Self-pay | Admitting: Obstetrics & Gynecology

## 2018-08-21 VITALS — BP 136/88

## 2018-08-21 DIAGNOSIS — B3731 Acute candidiasis of vulva and vagina: Secondary | ICD-10-CM

## 2018-08-21 DIAGNOSIS — B373 Candidiasis of vulva and vagina: Secondary | ICD-10-CM | POA: Diagnosis not present

## 2018-08-21 DIAGNOSIS — N951 Menopausal and female climacteric states: Secondary | ICD-10-CM | POA: Diagnosis not present

## 2018-08-21 DIAGNOSIS — N9089 Other specified noninflammatory disorders of vulva and perineum: Secondary | ICD-10-CM

## 2018-08-21 MED ORDER — FLUCONAZOLE 150 MG PO TABS: 150.0000 mg | ORAL_TABLET | Freq: Every day | ORAL | 1 refills | Status: AC

## 2018-08-21 MED ORDER — ESTRADIOL 0.1 MG/24HR TD PTWK: 0.1000 mg | MEDICATED_PATCH | TRANSDERMAL | 4 refills | Status: DC

## 2018-08-21 MED ORDER — TERCONAZOLE 0.8 % VA CREA: 1.0000 | TOPICAL_CREAM | Freq: Every day | VAGINAL | 1 refills | Status: AC

## 2018-08-21 NOTE — Therapy (Signed)
[place], [place], [address] Phone: [phone]   Fax:  [phone]  Physical Therapy Treatment  Patient Details  Name: Julia Green MRN: [id number] Date of Birth: 01-16-1971 Referring Provider (PT): Tiney Rouge, [place]   Encounter Date: 08/20/2018  PT End of Session - 08/20/18 1953    Visit Number  4    Number of Visits  8    Date for PT Re-Evaluation  08/27/18    Authorization Type  [employer]-60 visit limit    PT Start Time  1502    PT Stop Time  1555    PT Time Calculation (min)  53 min    Activity Tolerance  Patient tolerated treatment well    Behavior During Therapy  [facility] for tasks assessed/performed       Past Medical History:  Diagnosis Date  . Arthritis   . Asthma   . Depression   . Fatty liver   . Gallstones   . GERD (gastroesophageal reflux disease)   . Headache   . History of low potassium   . Hypertension   . Hypothyroidism   . Right arm numbness   . Right leg numbness   . Sciatic nerve pain   . Thyroid disease     Past Surgical History:  Procedure Laterality Date  . COLONOSCOPY    . LAPAROSCOPIC VAGINAL HYSTERECTOMY WITH SALPINGECTOMY Bilateral 06/26/2017   Procedure: LAPAROSCOPIC ASSISTED VAGINAL HYSTERECTOMY WITH SALPINGECTOMY;  Surgeon: Princess Bruins, MD;  Location: [facility];  Service: Gynecology;  Laterality: Bilateral;  request 7:30am OR time  requests 2 hours Rex the lighted retractor rep will be here   . UPPER GI ENDOSCOPY      There were no vitals filed for this visit.  Subjective Assessment - 08/20/18 1608    Subjective  Pt. reports still no improvement or changes with neck and UE radiating symptoms. Tx. thus far had focused on neck as primary region of complaint but PT Rx. includes lumbar region as well, pt. agreeable to have this area assessed and try HEP but due to continued pain wishes to d/c today-she is scheduled for lumbar ESI next week and will  follow up with MD for status with neck given continued symptoms. For lumbar region pt. reports multiple year history of symptoms including lumbar and bilateral LE radiating pain and numbness. Pain/symptoms worse with sitting and standing/walking but seem to be the worst with standing.    Patient is accompained by:  Interpreter    Pertinent History  5-6 year history symptoms, also has lumbar radiculopathy    Limitations  Sitting;Lifting;Standing;House hold activities    How long can you sit comfortably?  unable comfortably    How long can you stand comfortably?  unable comfortably    How long can you walk comfortably?  no limitations due to neck but has limited tolerance at times due to lumbar region    Diagnostic tests  X-rays for neck, lumbar MRI-per report showed severe facet degeneration, mild-mod stenosis, multiple level disc bulges, also has spondylolisthesis    Patient Stated Goals  Does not know-did not want to come to therapy    Currently in Pain?  Yes    Pain Score  6     Pain Location  Neck    Pain Orientation  Posterior;Left;Right    Pain Descriptors / Indicators  Constant;Stabbing    Pain Type  Chronic pain    Pain Radiating Towards  bilat. arms and hands  Pain Onset  More than a month ago    Pain Frequency  Constant    Aggravating Factors   no aggs noted    Pain Relieving Factors  none    Effect of Pain on Daily Activities  limits tolerance ADLs/IADLs, difficulty with gripping and lifting activites    Multiple Pain Sites  Yes    Pain Score  6    Pain Location  Back    Pain Orientation  Lower    Pain Descriptors / Indicators  Sharp    Pain Type  Chronic pain    Pain Radiating Towards  bilat. legs and feet    Pain Onset  More than a month ago    Pain Frequency  Constant    Aggravating Factors   standing, walking, sitting    Pain Relieving Factors  no specific eases noted    Effect of Pain on Daily Activities  limits positional tolerance for standing and sitting,  difficulty prolonged walking                               PT Education - 08/20/18 1953    Education Details  HEP (lumbar), POC    Person(s) Educated  Patient    Methods  Explanation;Demonstration;Verbal cues;Handout    Comprehension  Verbalized understanding;Returned demonstration          PT Long Term Goals - 08/20/18 2001      PT LONG TERM GOAL #1   Title  Independent with HEP (updated today to include lumbar region)    Baseline  met    Time  4    Period  Weeks    Status  Achieved      PT LONG TERM GOAL #2   Title  Centralize bilat. UE pain/parasthesias to elbows or more proximally for performance of work duties and gripping activities without hand pain    Baseline   no improvement    Time  4    Period  Weeks    Status  Not Met      PT LONG TERM GOAL #3   Title  Improve FOTO score to 47% or less limited    Baseline  47%-met    Time  4    Period  Weeks    Status  Achieved      PT LONG TERM GOAL #4   Title  Bilat. UE strength grossly 5/5 to improve ability for lifting activities for work, chores    Baseline  not met,     Time  4    Period  Weeks    Status  Not Met            Plan - 08/20/18 1954    Clinical Impression Statement  No changes or improvements for neck/UE radicular symptoms despite tx. efforts including manual/mechanical traction and attempts directional preference exercises. Assessed lumbar region today-no clear directional preference and per imaging status complicated with combination severe facet degeneration along with disc bulges, spondlyolisthesis and stenosis. Given symptoms worse with standing along with factors including tolerance for flexion ROM during assessment, (-) SLR and imaging findings instructed in trial mat-based flexion bias exercises and core strengthening. Plan given lack of improvement is to d/c today/per pt. preference and pt. will follow up with referring provider for further assessment and treatment  options as needed.    Clinical Presentation due to:  lack of mechanical/directional response to symptoms for neck and  lumbar region    Clinical Decision Making  Moderate    Clinical Impairments Affecting Rehab Potential  upper extremity weakness with bilateral radicular symptoms    PT Frequency  --   d/c after today's visit   PT Duration  --   NA   PT Treatment/Interventions  ADLs/Self Care Home Management;Cryotherapy;Electrical Stimulation;Ultrasound;Traction;Moist Heat;Therapeutic activities;Therapeutic exercise;Neuromuscular re-education;Patient/family education;Manual techniques;Dry needling;Taping    PT Next Visit Plan  NA    PT Home Exercise Plan  cervical retractions, decompression series, upper trap and levator stretches, see pt. instructions for lumbar exercises    Consulted and Agree with Plan of Care  Patient       Patient will benefit from skilled therapeutic intervention in order to improve the following deficits and impairments:  Pain, Impaired sensation, Decreased strength, Impaired UE functional use, Postural dysfunction, Decreased activity tolerance  Visit Diagnosis: Cervicalgia - Plan: PT plan of care cert/re-cert  Radiculopathy, cervical region - Plan: PT plan of care cert/re-cert  Radiculopathy, lumbar region - Plan: PT plan of care cert/re-cert  Muscle weakness (generalized) - Plan: PT plan of care cert/re-cert  Abnormal posture - Plan: PT plan of care cert/re-cert     Problem List Patient Active Problem List   Diagnosis Date Noted  . Right low back pain 07/17/2018  . Neck pain 07/17/2018  . Postoperative state 06/26/2017  . Rotator cuff syndrome of right shoulder 05/30/2017  . 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  . Abdominal bloating 03/22/2017  . Other specified hypothyroidism 11/16/2016  . Dysmenorrhea 07/20/2016  . 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  . Dyspareunia, female 06/16/2016    Judeth Cornfield [facility] 08/21/2018,  9:20 AM  [facility] [address] [place], [place], [address] Phone: [phone]   Fax:  [phone]  Name: Narissa Beaufort MRN: [id number] Date of Birth: 04-20-1971

## 2018-08-21 NOTE — Progress Notes (Signed)
    Julia Green [birth year] [id number]        47 y.o.  G4P4L4 Married  RP: Menopause for discussion of HRT  HPI: S/P Total Hysterectomy.  [place] 32.6 on 07/18/2018.  Patient is having severe hot flushes and night sweats with resulting difficulties sleeping.  Feels tired.  Has low libido.  Complaining of vulvar irritation and itching.  No pelvic pain.  Appears depressed but no symptom of major depression and has no suicidal ideation.   OB History  Gravida Para Term Preterm AB Living  4 4       4   SAB TAB Ectopic Multiple Live Births               # Outcome Date GA Lbr Len/2nd Weight Sex Delivery Anes PTL Lv  4 Para           3 Para           2 Para           1 Para             Past medical history,surgical history, problem list, medications, allergies, family history and social history were all reviewed and documented in the EPIC chart.   Directed ROS with pertinent positives and negatives documented in the history of present illness/assessment and plan.  Exam:  There were no vitals filed for this visit. General appearance:  Normal  Abdomen: Normal  Gynecologic exam: Vulva mildly irritated.  Mild increase in secretions.  Wet prep done.  Wet prep: Abundant yeasts with hyphae.   Assessment/Plan:  47 y.o. G4P4   1. Menopausal syndrome Severe menopausal symptoms including hot flushes and night sweats with secondary insomnia and fatigue.  Low libido.  Status post total hysterectomy.  Decision to treat with estradiol patch 0.1 mg weekly.  No contraindication to hormone replacement therapy.  Usage, risks and benefits reviewed thoroughly with patient.  Low risk of blood clots and breast cancer up to 10 years of use reviewed with patient.  Benefit on bone preservation, cardioprotection, control of menopausal vasomotor symptoms and potential improvement of libido, sleep and fatigue reviewed with patient.  2. Vulvar irritation Wet prep confirmed yeast vulvo-vaginitis. - WET  PREP FOR TRICH, YEAST, CLUE  3. Yeast vaginitis We will treat with fluconazole 155 mg 1 tablet daily for 3 days and terconazole 0.8% vaginal cream 1 applicator vaginally and small amount on vulva for 3 days.  Prescription sent to pharmacy.  Other orders - estradiol (CLIMARA - DOSED IN MG/24 HR) 0.1 mg/24hr patch; Place 1 patch (0.1 mg total) onto the skin once a week. - fluconazole (DIFLUCAN) 150 MG tablet; Take 1 tablet (150 mg total) by mouth daily for 3 days. - terconazole (TERAZOL 3) 0.8 % vaginal cream; Place 1 applicator vaginally at bedtime for 3 days. And on vulva x 3 days  Counseling on above issues and coordination of care more than 50% for 25 minutes.  Princess Bruins MD, 3:04 PM 08/21/2018

## 2018-08-22 HISTORY — PX: APPENDECTOMY: SHX54

## 2018-08-22 NOTE — Patient Instructions (Signed)
1. Menopausal syndrome Severe menopausal symptoms including hot flushes and night sweats with secondary insomnia and fatigue.  Low libido.  Status post total hysterectomy.  Decision to treat with estradiol patch 0.1 mg weekly.  No contraindication to hormone replacement therapy.  Usage, risks and benefits reviewed thoroughly with patient.  Low risk of blood clots and breast cancer up to 10 years of use reviewed with patient.  Benefit on bone preservation, cardioprotection, control of menopausal vasomotor symptoms and potential improvement of libido, sleep and fatigue reviewed with patient.  2. Vulvar irritation Wet prep confirmed yeast vulvo-vaginitis. - WET PREP FOR TRICH, YEAST, CLUE  3. Yeast vaginitis We will treat with fluconazole 155 mg 1 tablet daily for 3 days and terconazole 0.8% vaginal cream 1 applicator vaginally and small amount on vulva for 3 days.  Prescription sent to pharmacy.  Other orders - estradiol (CLIMARA - DOSED IN MG/24 HR) 0.1 mg/24hr patch; Place 1 patch (0.1 mg total) onto the skin once a week. - fluconazole (DIFLUCAN) 150 MG tablet; Take 1 tablet (150 mg total) by mouth daily for 3 days. - terconazole (TERAZOL 3) 0.8 % vaginal cream; Place 1 applicator vaginally at bedtime for 3 days. And on vulva x 3 days  Jinna, fue un placer verle hoy!

## 2018-08-23 ENCOUNTER — Ambulatory Visit: Payer: 59 | Admitting: Physical Therapy

## 2018-08-23 LAB — WET PREP FOR TRICH, YEAST, CLUE

## 2018-08-27 ENCOUNTER — Ambulatory Visit (INDEPENDENT_AMBULATORY_CARE_PROVIDER_SITE_OTHER): Payer: Self-pay

## 2018-08-27 ENCOUNTER — Encounter (INDEPENDENT_AMBULATORY_CARE_PROVIDER_SITE_OTHER): Payer: Self-pay | Admitting: Physical Medicine and Rehabilitation

## 2018-08-27 ENCOUNTER — Ambulatory Visit (INDEPENDENT_AMBULATORY_CARE_PROVIDER_SITE_OTHER): Payer: 59 | Admitting: Physical Medicine and Rehabilitation

## 2018-08-27 VITALS — BP 130/92 | HR 69 | Temp 98.3°F

## 2018-08-27 DIAGNOSIS — M5416 Radiculopathy, lumbar region: Secondary | ICD-10-CM | POA: Diagnosis not present

## 2018-08-27 DIAGNOSIS — M4316 Spondylolisthesis, lumbar region: Secondary | ICD-10-CM

## 2018-08-27 MED ORDER — BETAMETHASONE SOD PHOS & ACET 6 (3-3) MG/ML IJ SUSP
12.0000 mg | Freq: Once | INTRAMUSCULAR | Status: AC
  Administered 2018-08-27: 12 mg

## 2018-08-27 NOTE — Patient Instructions (Signed)

## 2018-08-27 NOTE — Progress Notes (Signed)
 .  Numeric Pain Rating Scale and Functional Assessment Average Pain 6   In the last MONTH (on 0-10 scale) has pain interfered with the following?  1. General activity like being  able to carry out your everyday physical activities such as walking, climbing stairs, carrying groceries, or moving a chair?  Rating(4)   +Driver, -BT, -Dye Allergies.  

## 2018-08-28 NOTE — Procedures (Signed)
Lumbosacral Transforaminal Epidural Steroid Injection - Sub-Pedicular Approach with Fluoroscopic Guidance  Patient: Julia Green      Date of Birth: 06/26/1971 MRN: [id number] PCP: Cathleen Corti, PA-C      Visit Date: 08/27/2018   Universal Protocol:    Date/Time: 08/27/2018  Consent Given By: the patient  Position: PRONE  Additional Comments: Vital signs were monitored before and after the procedure. Patient was prepped and draped in the usual sterile fashion. The correct patient, procedure, and site was verified.   Injection Procedure Details:  Procedure Site One Meds Administered:  Meds ordered this encounter  Medications  . betamethasone acetate-betamethasone sodium phosphate (CELESTONE) injection 12 mg    Laterality: Bilateral  Location/Site:  L4-L5  Needle size: 22 G  Needle type: Spinal  Needle Placement: Transforaminal  Findings:    -Comments: Excellent flow of contrast along the nerve and into the epidural space.  Procedure Details: After squaring off the end-plates to get a true AP view, the C-arm was positioned so that an oblique view of the foramen as noted above was visualized. The target area is just inferior to the "nose of the scotty dog" or sub pedicular. The soft tissues overlying this structure were infiltrated with 2-3 ml. of 1% Lidocaine without Epinephrine.  The spinal needle was inserted toward the target using a "trajectory" view along the fluoroscope beam.  Under AP and lateral visualization, the needle was advanced so it did not puncture dura and was located close the 6 O'Clock position of the pedical in AP tracterory. Biplanar projections were used to confirm position. Aspiration was confirmed to be negative for CSF and/or blood. A 1-2 ml. volume of Isovue-250 was injected and flow of contrast was noted at each level. Radiographs were obtained for documentation purposes.   After attaining the desired flow of contrast documented  above, a 0.5 to 1.0 ml test dose of 0.25% Marcaine was injected into each respective transforaminal space.  The patient was observed for 90 seconds post injection.  After no sensory deficits were reported, and normal lower extremity motor function was noted,   the above injectate was administered so that equal amounts of the injectate were placed at each foramen (level) into the transforaminal epidural space.   Additional Comments:  The patient tolerated the procedure well Dressing: Band-Aid    Post-procedure details: Patient was observed during the procedure. Post-procedure instructions were reviewed.  Patient left the clinic in stable condition.

## 2018-08-28 NOTE — Progress Notes (Signed)
Julia Green - 48 y.o. female MRN [id number]  Date of birth: Jul 07, 1971  Office Visit Note: Visit Date: 08/27/2018 PCP: Cathleen Corti, PA-C Referred by: Cathleen Corti, PA-C  Subjective: Chief Complaint  Patient presents with  . Lower Back - Pain  . Right Leg - Pain  . Left Leg - Pain   HPI:  Julia Green is a 48 y.o. female who comes in today At the request of Dr. Eduard Roux for what was thought to be repeat injection that we completed a year ago.  Unfortunately the patient does not really remember if the injection helped at the time.  We do talk today through an interpreter.  With much talking through the interpreter it sounds like she did get some relief maybe but it was temporary and she had a gynecological surgery about a month later so she does not really remember through all that if it helped her back or not.  MRI findings show facet arthritis at L4-5 with small listhesis but without stenosis or nerve compression.  Small annular tearing at L4-5 and L5-S1 with central protrusion but no compressive nerve problems.  Pain is in the low back and bilateral hip and thigh.  After talking with her today I think the best approach is may be trying an L for transforaminal injection bilaterally and just seeing how she does.  If it does not help much at all would consider diagnostic medial branch blocks with pain diary because of language difficulties as well the pain diary may help.  Otherwise she would do well with continued physical therapy and core strengthening Dr. Erlinda Hong may wish to refer her to potentially pain management if all else fails and she is just not getting anywhere.  Should she would be a surgical candidate.  Exam today shows that the patient ambulates without aid and has good strength bilaterally without clonus.  ROS Otherwise per HPI.  Assessment & Plan: Visit Diagnoses:  1. Lumbar radiculopathy   2. Spondylolisthesis of lumbar region     Plan: No  additional findings.   Meds & Orders:  Meds ordered this encounter  Medications  . betamethasone acetate-betamethasone sodium phosphate (CELESTONE) injection 12 mg    Orders Placed This Encounter  Procedures  . XR C-ARM NO REPORT  . Epidural Steroid injection    Follow-up: Return if symptoms worsen or fail to improve.   Procedures: No procedures performed  Lumbosacral Transforaminal Epidural Steroid Injection - Sub-Pedicular Approach with Fluoroscopic Guidance  Patient: Julia Green      Date of Birth: [birth year] MRN: [id number] PCP: Cathleen Corti, PA-C      Visit Date: 08/27/2018   Universal Protocol:    Date/Time: 08/27/2018  Consent Given By: the patient  Position: PRONE  Additional Comments: Vital signs were monitored before and after the procedure. Patient was prepped and draped in the usual sterile fashion. The correct patient, procedure, and site was verified.   Injection Procedure Details:  Procedure Site One Meds Administered:  Meds ordered this encounter  Medications  . betamethasone acetate-betamethasone sodium phosphate (CELESTONE) injection 12 mg    Laterality: Bilateral  Location/Site:  L4-L5  Needle size: 22 G  Needle type: Spinal  Needle Placement: Transforaminal  Findings:    -Comments: Excellent flow of contrast along the nerve and into the epidural space.  Procedure Details: After squaring off the end-plates to get a true AP view, the C-arm was positioned so that an oblique view  of the foramen as noted above was visualized. The target area is just inferior to the "nose of the scotty dog" or sub pedicular. The soft tissues overlying this structure were infiltrated with 2-3 ml. of 1% Lidocaine without Epinephrine.  The spinal needle was inserted toward the target using a "trajectory" view along the fluoroscope beam.  Under AP and lateral visualization, the needle was advanced so it did not puncture dura and was located close  the 6 O'Clock position of the pedical in AP tracterory. Biplanar projections were used to confirm position. Aspiration was confirmed to be negative for CSF and/or blood. A 1-2 ml. volume of Isovue-250 was injected and flow of contrast was noted at each level. Radiographs were obtained for documentation purposes.   After attaining the desired flow of contrast documented above, a 0.5 to 1.0 ml test dose of 0.25% Marcaine was injected into each respective transforaminal space.  The patient was observed for 90 seconds post injection.  After no sensory deficits were reported, and normal lower extremity motor function was noted,   the above injectate was administered so that equal amounts of the injectate were placed at each foramen (level) into the transforaminal epidural space.   Additional Comments:  The patient tolerated the procedure well Dressing: Band-Aid    Post-procedure details: Patient was observed during the procedure. Post-procedure instructions were reviewed.  Patient left the clinic in stable condition.    Clinical History: MRI LUMBAR SPINE WITHOUT CONTRAST  TECHNIQUE: Multiplanar, multisequence MR imaging of the lumbar spine was performed. No intravenous contrast was administered.  COMPARISON:  Lumbar spine radiograph 04/17/2017  FINDINGS: Segmentation:  Standard.  Alignment:  Grade 1 L4-L5 anterolisthesis  Vertebrae:  Bilateral facet edema at L4-L5.  Conus medullaris: Extends to the L1 level and appears normal.  Paraspinal and other soft tissues: The visualized aorta, IVC and iliac vessels are normal. The visualized retroperitoneal organs and paraspinal soft tissues are normal.  Disc levels:  T12-L1: Normal disc space and facets. No spinal canal or neuroforaminal stenosis.  L1-L2: Normal disc space and facets. No spinal canal or neuroforaminal stenosis.  L2-L3: Normal disc space and facets. No spinal canal or neuroforaminal stenosis.  L3-L4:  Normal disc space and facets. No spinal canal or neuroforaminal stenosis.  L4-L5: Grade 1 anterolisthesis. There may be bilateral pars interarticularis defects. There is severe bilateral facet hypertrophy with fluid filling the widened facet joint spaces. There is a small central disc protrusion with annular fissure. Mild spinal canal stenosis. Mild left foraminal stenosis.  L5-S1: Small central disc protrusion with annular fissure. Mild facet hypertrophy. No spinal canal stenosis. No neural foraminal stenosis.  Visualized sacrum: Normal.  IMPRESSION: 1. Severe facet arthrosis at L4-L5 with widening of the joint spaces, which are fluid-filled. This may indicate a degree of segmental instability. 2. Grade 1 L4-L5 anterolisthesis, possibly due to pars interarticularis defects. This could be confirmed with dedicated the oblique radiography of the lumbar spine. 3. Small central disc protrusions at L4-L5 and L5-S1 with mild spinal canal stenosis at L4-5. No L5-S1 stenosis.   Electronically Signed   By: Ulyses Jarred M.D.   On: 05/10/2017 02:45     Objective:  VS:  HT:    WT:   BMI:     BP:(!) 130/92  HR:69bpm  TEMP:98.3 F (36.8 C)(Oral)  RESP:  Physical Exam  Ortho Exam Imaging: Xr C-arm No Report  Result Date: 08/27/2018 Please see Notes tab for imaging impression.

## 2018-09-03 ENCOUNTER — Telehealth: Payer: Self-pay | Admitting: *Deleted

## 2018-09-03 NOTE — Telephone Encounter (Signed)
-----   Message from Sinclair Grooms sent at 09/03/2018  3:41 PM EST ----- Regarding: rx Patient states she can not afford estradiol (CLIMARA - DOSED IN MG/24 HR) 0.1 mg/24hr patch and wants to know it Dellis Filbert can prescribe something else.

## 2018-09-07 NOTE — Telephone Encounter (Signed)
Estradiol 1 mg/tab 1 tab PO daily.  #90, refill x 4.

## 2018-09-10 MED ORDER — ESTRADIOL 1 MG PO TABS: 1.0000 mg | ORAL_TABLET | Freq: Every day | ORAL | 0 refills | Status: DC

## 2018-09-10 NOTE — Telephone Encounter (Signed)
Patient informed. 

## 2018-09-10 NOTE — Telephone Encounter (Signed)
Rx sent to pharmacy, will route to [facility] for relay to patient.

## 2018-09-20 ENCOUNTER — Ambulatory Visit: Payer: 59 | Admitting: Obstetrics & Gynecology

## 2018-12-07 ENCOUNTER — Other Ambulatory Visit: Payer: Self-pay | Admitting: Obstetrics & Gynecology

## 2019-01-23 ENCOUNTER — Ambulatory Visit: Payer: 59 | Admitting: Orthopaedic Surgery

## 2019-01-30 ENCOUNTER — Other Ambulatory Visit: Payer: Self-pay

## 2019-01-30 ENCOUNTER — Encounter: Payer: Self-pay | Admitting: Orthopaedic Surgery

## 2019-01-30 ENCOUNTER — Ambulatory Visit (INDEPENDENT_AMBULATORY_CARE_PROVIDER_SITE_OTHER): Payer: 59 | Admitting: Orthopaedic Surgery

## 2019-01-30 DIAGNOSIS — M545 Low back pain, unspecified: Secondary | ICD-10-CM

## 2019-01-30 DIAGNOSIS — M25511 Pain in right shoulder: Secondary | ICD-10-CM | POA: Diagnosis not present

## 2019-01-30 DIAGNOSIS — M79641 Pain in right hand: Secondary | ICD-10-CM | POA: Diagnosis not present

## 2019-01-30 DIAGNOSIS — M79642 Pain in left hand: Secondary | ICD-10-CM

## 2019-01-30 DIAGNOSIS — G8929 Other chronic pain: Secondary | ICD-10-CM

## 2019-01-30 DIAGNOSIS — M542 Cervicalgia: Secondary | ICD-10-CM

## 2019-01-30 DIAGNOSIS — M25512 Pain in left shoulder: Secondary | ICD-10-CM

## 2019-01-30 NOTE — Progress Notes (Signed)
Office Visit Note   Patient: Julia Green           Date of Birth: [birth year]           MRN: [id number] Visit Date: 01/30/2019              Requested by: Cathleen Corti, PA-C [place], [place] [address] PCP: Cathleen Corti, PA-C   Assessment & Plan: Visit Diagnoses:  1. Chronic pain of both shoulders   2. Pain in both hands   3. Right low back pain, unspecified chronicity, unspecified whether sciatica present   4. Neck pain     Plan: Impression is questionable autoimmune arthritis and carpal tunnel syndrome.  We will obtain nerve conduction studies to assess for this.  We will also obtain arthritis panel today.  Follow-up afterwards.  Follow-Up Instructions: Return if symptoms worsen or fail to improve.   Orders:  Orders Placed This Encounter  Procedures  . Antinuclear Antib (ANA)  . Uric acid  . Sed Rate (ESR)  . Rheumatoid Factor  . Ambulatory referral to Physical Medicine Rehab   No orders of the defined types were placed in this encounter.     Procedures: No procedures performed   Clinical Data: No additional findings.   Subjective: Chief Complaint  Patient presents with  . Right Hand - Pain  . Left Hand - Pain  . Right Shoulder - Pain  . Left Shoulder - Pain  . Left Leg - Pain    [name] comes in today for evaluation of bilateral arm pain, bilateral hand pain bilateral hand numbness this and weakness, bilateral shoulder pain, and left leg pain.  She states that due to she has swelling and numbness and tingling at all times.  Denies any constitutional symptoms.   Review of Systems   Objective: Vital Signs: LMP 03/24/2017   Physical Exam  Ortho Exam Exam of bilateral arms hands and legs are nonfocal.  She does have some mild swelling of her hands.  They are globally tender. Specialty Comments:  No specialty comments available.  Imaging: No results found.   PMFS History: Patient Active Problem List   Diagnosis  Date Noted  . Right low back pain 07/17/2018  . Neck pain 07/17/2018  . Postoperative state 06/26/2017  . Rotator cuff syndrome of right shoulder 05/30/2017  . 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  . Abdominal bloating 03/22/2017  . Other specified hypothyroidism 11/16/2016  . Dysmenorrhea 07/20/2016  . 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  . Dyspareunia, female 06/16/2016   Past Medical History:  Diagnosis Date  . Arthritis   . Asthma   . Depression   . Fatty liver   . Gallstones   . GERD (gastroesophageal reflux disease)   . Headache   . History of low potassium   . Hypertension   . Hypothyroidism   . Right arm numbness   . Right leg numbness   . Sciatic nerve pain   . Thyroid disease     Family History  Problem Relation Age of Onset  . Hypertension Mother   . Uterine cancer Mother   . Cancer Sister        unknown  . Stomach cancer Maternal Aunt   . Colon cancer Neg Hx   . Esophageal cancer Neg Hx   . Rectal cancer Neg Hx     Past Surgical History:  Procedure Laterality Date  . COLONOSCOPY    . LAPAROSCOPIC VAGINAL HYSTERECTOMY  WITH SALPINGECTOMY Bilateral 06/26/2017   Procedure: LAPAROSCOPIC ASSISTED VAGINAL HYSTERECTOMY WITH SALPINGECTOMY;  Surgeon: Princess Bruins, MD;  Location: [facility];  Service: Gynecology;  Laterality: Bilateral;  request 7:30am OR time  requests 2 hours Rex the lighted retractor rep will be here   . UPPER GI ENDOSCOPY     Social History   Occupational History  . Not on file  Tobacco Use  . Smoking status: Never Smoker  . Smokeless tobacco: Never Used  Substance and Sexual Activity  . Alcohol use: No  . Drug use: No  . Sexual activity: Yes    Birth control/protection: None

## 2019-02-01 LAB — SEDIMENTATION RATE: Sed Rate: 11 mm/h (ref 0–20)

## 2019-02-01 LAB — RHEUMATOID FACTOR: Rheumatoid fact SerPl-aCnc: <14 IU/mL (ref <14)

## 2019-02-01 LAB — ANA: Anti Nuclear Antibody (ANA): NEGATIVE

## 2019-02-01 LAB — URIC ACID: Uric Acid, Serum: 4.6 mg/dL (ref 2.5–7.0)

## 2019-02-05 ENCOUNTER — Ambulatory Visit (INDEPENDENT_AMBULATORY_CARE_PROVIDER_SITE_OTHER): Payer: 59 | Admitting: Orthopaedic Surgery

## 2019-02-05 ENCOUNTER — Encounter: Payer: Self-pay | Admitting: Orthopaedic Surgery

## 2019-02-05 ENCOUNTER — Ambulatory Visit: Payer: 59 | Admitting: Orthopaedic Surgery

## 2019-02-05 ENCOUNTER — Other Ambulatory Visit: Payer: Self-pay

## 2019-02-05 DIAGNOSIS — M47816 Spondylosis without myelopathy or radiculopathy, lumbar region: Secondary | ICD-10-CM

## 2019-02-05 MED ORDER — PREDNISONE 10 MG (21) PO TBPK: ORAL_TABLET | ORAL | 0 refills | Status: DC

## 2019-02-05 NOTE — Progress Notes (Signed)
Office Visit Note   Patient: Julia Green           Date of Birth: Mar 27, 1971           MRN: [id number] Visit Date: 02/05/2019              Requested by: Cathleen Corti, PA-C [address] [place],  [place] [id number] PCP: Cathleen Corti, PA-C   Assessment & Plan: Visit Diagnoses:  1. Spondylosis of lumbar region without myelopathy or radiculopathy     Plan: Impression is chronic low back pain with lateral recess stenosis of the lumbar segments.  We will refer patient to Dr. Kathyrn Sheriff for surgical evaluation.  She also presents with chronic pain and she is just tender to palpation throughout her body therefore I would like her to see her PCP to be evaluated for fibromyalgia.  Today's encounter was performed through an interpreter.  Prescription for prednisone Dosepak was sent to the pharmacy.  Follow-Up Instructions: Return if symptoms worsen or fail to improve.   Orders:  No orders of the defined types were placed in this encounter.  Meds ordered this encounter  Medications  . predniSONE (STERAPRED UNI-PAK 21 TAB) 10 MG (21) TBPK tablet    Sig: Take as directed    Dispense:  21 tablet    Refill:  0      Procedures: No procedures performed   Clinical Data: No additional findings.   Subjective: Chief Complaint  Patient presents with  . Spine - Pain    Julia Green comes back today for increased back pain and radiation to both legs.  She has had previous ESI's without significant relief.  Denies any bowel bladder dysfunction.  She does complain of chronic pain throughout her body.  Her previous arthritis panel was negative.   Review of Systems  Constitutional: Negative.   HENT: Negative.   Eyes: Negative.   Respiratory: Negative.   Cardiovascular: Negative.   Endocrine: Negative.   Musculoskeletal: Negative.   Neurological: Negative.   Hematological: Negative.   Psychiatric/Behavioral: Negative.   All other systems reviewed and are  negative.    Objective: Vital Signs: LMP 03/24/2017   Physical Exam Vitals signs and nursing note reviewed.  Constitutional:      Appearance: She is well-developed.  Pulmonary:     Effort: Pulmonary effort is normal.  Skin:    General: Skin is warm.     Capillary Refill: Capillary refill takes less than 2 seconds.  Neurological:     Mental Status: She is alert and oriented to person, place, and time.  Psychiatric:        Behavior: Behavior normal.        Thought Content: Thought content normal.        Judgment: Judgment normal.     Ortho Exam Bilateral lower extremity exams are nonfocal. Specialty Comments:  No specialty comments available.  Imaging: No results found.   PMFS History: Patient Active Problem List   Diagnosis Date Noted  . Right low back pain 07/17/2018  . Neck pain 07/17/2018  . Postoperative state 06/26/2017  . Rotator cuff syndrome of right shoulder 05/30/2017  . 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  . Abdominal bloating 03/22/2017  . Other specified hypothyroidism 11/16/2016  . Dysmenorrhea 07/20/2016  . 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  . Dyspareunia, female 06/16/2016   Past Medical History:  Diagnosis Date  . Arthritis   . Asthma   . Depression   .  Fatty liver   . Gallstones   . GERD (gastroesophageal reflux disease)   . Headache   . History of low potassium   . Hypertension   . Hypothyroidism   . Right arm numbness   . Right leg numbness   . Sciatic nerve pain   . Thyroid disease     Family History  Problem Relation Age of Onset  . Hypertension Mother   . Uterine cancer Mother   . Cancer Sister        unknown  . Stomach cancer Maternal Aunt   . Colon cancer Neg Hx   . Esophageal cancer Neg Hx   . Rectal cancer Neg Hx     Past Surgical History:  Procedure Laterality Date  . COLONOSCOPY    . LAPAROSCOPIC VAGINAL HYSTERECTOMY WITH SALPINGECTOMY Bilateral 06/26/2017   Procedure:  LAPAROSCOPIC ASSISTED VAGINAL HYSTERECTOMY WITH SALPINGECTOMY;  Surgeon: Princess Bruins, MD;  Location: [facility];  Service: Gynecology;  Laterality: Bilateral;  request 7:30am OR time  requests 2 hours Rex the lighted retractor rep will be here   . UPPER GI ENDOSCOPY     Social History   Occupational History  . Not on file  Tobacco Use  . Smoking status: Never Smoker  . Smokeless tobacco: Never Used  Substance and Sexual Activity  . Alcohol use: No  . Drug use: No  . Sexual activity: Yes    Birth control/protection: None

## 2019-02-05 NOTE — Addendum Note (Signed)
Addended byLaurann Montana on: 02/05/2019 03:22 PM   Modules accepted: Orders

## 2019-02-12 ENCOUNTER — Other Ambulatory Visit: Payer: Self-pay | Admitting: Physician Assistant

## 2019-02-12 ENCOUNTER — Telehealth: Payer: Self-pay | Admitting: Orthopaedic Surgery

## 2019-02-12 MED ORDER — TRAMADOL HCL 50 MG PO TABS: 50.0000 mg | ORAL_TABLET | Freq: Three times a day (TID) | ORAL | 0 refills | Status: DC | PRN

## 2019-02-12 NOTE — Telephone Encounter (Signed)
Just sent in

## 2019-02-12 NOTE — Telephone Encounter (Signed)
Patient is ok with tramadol

## 2019-02-12 NOTE — Progress Notes (Unsigned)
trama °

## 2019-02-12 NOTE — Telephone Encounter (Signed)
I can call in tramadol if she would like?  Cannot do anything stronger

## 2019-02-12 NOTE — Telephone Encounter (Signed)
Patient states she finish the Prednisone on Sunday & still having severe pain and wanted to know if she could have a pain medication.

## 2019-02-12 NOTE — Telephone Encounter (Signed)
Please advise. Thanks.  

## 2019-02-15 ENCOUNTER — Other Ambulatory Visit: Payer: Self-pay

## 2019-02-15 ENCOUNTER — Ambulatory Visit (INDEPENDENT_AMBULATORY_CARE_PROVIDER_SITE_OTHER): Payer: 59 | Admitting: Physical Medicine and Rehabilitation

## 2019-02-15 ENCOUNTER — Encounter: Payer: Self-pay | Admitting: Physical Medicine and Rehabilitation

## 2019-02-15 DIAGNOSIS — R202 Paresthesia of skin: Secondary | ICD-10-CM

## 2019-02-15 NOTE — Progress Notes (Signed)
  Numeric Pain Rating Scale and Functional Assessment Average Pain 8   In the last MONTH (on 0-10 scale) has pain interfered with the following?  1. General activity like being  able to carry out your everyday physical activities such as walking, climbing stairs, carrying groceries, or moving a chair?  Rating(9)   .  

## 2019-02-18 NOTE — Procedures (Signed)
EMG & NCV Findings: Evaluation of the left median motor nerve showed decreased conduction velocity (Elbow-Wrist, 47 m/s).  The right median motor nerve showed prolonged distal onset latency (5.5 ms) and decreased conduction velocity (Elbow-Wrist, 42 m/s).  The left radial motor nerve showed prolonged distal onset latency (2.9 ms).  The left median (across palm) sensory nerve showed prolonged distal peak latency (Wrist, 4.2 ms) and prolonged distal peak latency (Palm, 4.6 ms).  The right median (across palm) sensory nerve showed no response (Palm) and prolonged distal peak latency (5.9 ms).  All remaining nerves (as indicated in the following tables) were within normal limits.  Left vs. Right side comparison data for the median motor nerve indicates abnormal L-R latency difference (1.4 ms).  All remaining left vs. right side differences were within normal limits.    All examined muscles (as indicated in the following table) showed no evidence of electrical instability.    Impression: The above electrodiagnostic study is ABNORMAL and reveals evidence of:  1.  A moderate right median nerve entrapment at the wrist (carpal tunnel syndrome) affecting sensory and motor components.   2. A mild to moderate left median nerve entrapment at the wrist (carpal tunnel syndrome) affecting sensory and motor components.   There is no significant electrodiagnostic evidence of any other focal nerve entrapment, brachial plexopathy or cervical radiculopathy.    As you know, this particular electrodiagnostic study cannot rule out chemical radiculitis or sensory only radiculopathy.  **This electrodiagnostic study cannot rule out small fiber polyneuropathy and dysesthesias from central pain sensitization syndromes such as fibromyalgia.  Myotomal referral pain from trigger points is also not excluded.  Recommendations: 1.  Follow-up with referring physician. 2.  Continue current management of symptoms. 3.  Continue use of  resting splint at night-time and as needed during the day. 4.  Suggest surgical evaluation.  ___________________________ Laurence Spates FAAPMR Board Certified, American Board of Physical Medicine and Rehabilitation    Nerve Conduction Studies Anti Sensory Summary Table   Stim Site NR Peak (ms) Norm Peak (ms) P-T Amp (V) Norm P-T Amp Site1 Site2 Delta-P (ms) Dist (cm) Vel (m/s) Norm Vel (m/s)  Left Median Acr Palm Anti Sensory (2nd Digit)  31.5C  Wrist    *4.2 <3.6 18.6 >10 Wrist Palm 0.4 0.0    Palm    *4.6 <2.0 25.4         Right Median Acr Palm Anti Sensory (2nd Digit)  32.1C  Wrist    *5.9 <3.6 15.6 >10 Wrist Palm  0.0    Palm *NR  <2.0          Left Radial Anti Sensory (Base 1st Digit)  31.6C  Wrist    2.1 <3.1 42.6  Wrist Base 1st Digit 2.1 0.0    Right Radial Anti Sensory (Base 1st Digit)  31.5C  Wrist    2.0 <3.1 29.1  Wrist Base 1st Digit 2.0 0.0    Left Ulnar Anti Sensory (5th Digit)  31.6C  Wrist    3.2 <3.7 27.2 >15.0 Wrist 5th Digit 3.2 14.0 44 >38  Right Ulnar Anti Sensory (5th Digit)  31.9C  Wrist    3.1 <3.7 19.9 >15.0 Wrist 5th Digit 3.1 14.0 45 >38   Motor Summary Table   Stim Site NR Onset (ms) Norm Onset (ms) O-P Amp (mV) Norm O-P Amp Site1 Site2 Delta-0 (ms) Dist (cm) Vel (m/s) Norm Vel (m/s)  Left Median Motor (Abd Poll Brev)  31.5C  Wrist    4.1 <  4.2 8.8 >5 Elbow Wrist 4.3 20.0 *47 >50  Elbow    8.4  8.4         Right Median Motor (Abd Poll Brev)  31.3C  Wrist    *5.5 <4.2 8.6 >5 Elbow Wrist 5.0 21.0 *42 >50  Elbow    10.5  8.8         Left Radial Motor (Ext Indicis)  31.5C  8cm    *2.9 <2.5 9.4 >1.7 Up Arm 8cm 2.7 17.0 63 >60  Up Arm    5.6  8.7  Axilla Up Arm 1.4 10.0 71   Axilla    7.0  8.8         Right Ulnar Motor (Abd Dig Min)  31.1C  Wrist    2.9 <4.2 9.6 >3 B Elbow Wrist 3.2 19.0 59 >53  B Elbow    6.1  9.1  A Elbow B Elbow 1.2 11.0 92 >53  A Elbow    7.3  9.2          EMG   Side Muscle Nerve Root Ins Act Fibs Psw Amp Dur Poly  Recrt Int Fraser Din Comment  Right Abd Poll Brev Median C8-T1 Nml Nml Nml Nml Nml 0 Nml Nml   Right 1stDorInt Ulnar C8-T1 Nml Nml Nml Nml Nml 0 Nml Nml   Right PronatorTeres Median C6-7 Nml Nml Nml Nml Nml 0 Nml Nml   Right Biceps Musculocut C5-6 Nml Nml Nml Nml Nml 0 Nml Nml   Right Deltoid Axillary C5-6 Nml Nml Nml Nml Nml 0 Nml Nml     Nerve Conduction Studies Anti Sensory Left/Right Comparison   Stim Site L Lat (ms) R Lat (ms) L-R Lat (ms) L Amp (V) R Amp (V) L-R Amp (%) Site1 Site2 L Vel (m/s) R Vel (m/s) L-R Vel (m/s)  Median Acr Palm Anti Sensory (2nd Digit)  31.5C  Wrist *4.2 *5.9 1.7 18.6 15.6 16.1 Wrist Palm     Palm *4.6   25.4         Radial Anti Sensory (Base 1st Digit)  31.6C  Wrist 2.1 2.0 0.1 42.6 29.1 31.7 Wrist Base 1st Digit     Ulnar Anti Sensory (5th Digit)  31.6C  Wrist 3.2 3.1 0.1 27.2 19.9 26.8 Wrist 5th Digit 44 45 1   Motor Left/Right Comparison   Stim Site L Lat (ms) R Lat (ms) L-R Lat (ms) L Amp (mV) R Amp (mV) L-R Amp (%) Site1 Site2 L Vel (m/s) R Vel (m/s) L-R Vel (m/s)  Median Motor (Abd Poll Brev)  31.5C  Wrist 4.1 *5.5 *1.4 8.8 8.6 2.3 Elbow Wrist *47 *42 5  Elbow 8.4 10.5 2.1 8.4 8.8 4.5       Radial Motor (Ext Indicis)  31.5C  8cm *2.9   9.4   Up Arm 8cm 63    Up Arm 5.6   8.7   Axilla Up Arm 71    Axilla 7.0   8.8         Ulnar Motor (Abd Dig Min)  31.1C  Wrist  2.9   9.6  B Elbow Wrist  59   B Elbow  6.1   9.1  A Elbow B Elbow  92   A Elbow  7.3   9.2           Waveforms:

## 2019-02-18 NOTE — Progress Notes (Signed)
Poet tamesha ellerbrock - 48 y.o. female MRN [id number]  Date of birth: 04-22-1971  Office Visit Note: Visit Date: 02/15/2019 PCP: Cathleen Corti, PA-C Referred by: Cathleen Corti, PA-C  Subjective: Chief Complaint  Patient presents with   Right Hand - Numbness   Left Hand - Numbness   HPI:  Julia Green is a 48 y.o. female who comes in today At the request of Dr. Eduard Roux for electrodiagnostic study of both upper limbs.  Patient is right-hand dominant.  She has a family member here today acting as interpreter she is a native [place] speaking individual.  We backslash seen the patient in the past for lumbar spine interventional procedure which was really only temporarily successful.  As an aside Dr. Erlinda Hong notes that a referral was made for Dr. Kathyrn Sheriff for evaluation.  The patient states that she did not know that was happening and I asked her on her follow-up appointment to asked Dr. Erlinda Hong about this.  I did confirm that there was a referral placed and that they may be getting a call from that office for an appointment.  Nonetheless, the patient is having bilateral hand numbness right more than left with what she refers to as swelling.  Numbness seems to be in the third fourth and fifth digits however in a somewhat nondermatomal fashion.  She really has pain all over her body low back and upper shoulders and arms in a non-dermatomal fashion.  She does not carry a diagnosis of fibromyalgia.  She rates her pain as an 8 out of 10.  It is worse at night and worse with activity.  She is awakened at night with a positive flick sign.  ROS Otherwise per HPI.  Assessment & Plan: Visit Diagnoses:  1. Paresthesia of skin     Plan:  Impression: The above electrodiagnostic study is ABNORMAL and reveals evidence of:  1.  A moderate right median nerve entrapment at the wrist (carpal tunnel syndrome) affecting sensory and motor components.   2. A mild to moderate left median nerve  entrapment at the wrist (carpal tunnel syndrome) affecting sensory and motor components.   There is no significant electrodiagnostic evidence of any other focal nerve entrapment, brachial plexopathy or cervical radiculopathy.    As you know, this particular electrodiagnostic study cannot rule out chemical radiculitis or sensory only radiculopathy.  **This electrodiagnostic study cannot rule out small fiber polyneuropathy and dysesthesias from central pain sensitization syndromes such as fibromyalgia.  Myotomal referral pain from trigger points is also not excluded.  Recommendations: 1.  Follow-up with referring physician. 2.  Continue current management of symptoms. 3.  Continue use of resting splint at night-time and as needed during the day. 4.  Suggest surgical evaluation.  Meds & Orders: No orders of the defined types were placed in this encounter.   Orders Placed This Encounter  Procedures   NCV with EMG (electromyography)    Follow-up: Return for Eduard Roux, MD.   Procedures: No procedures performed  EMG & NCV Findings: Evaluation of the left median motor nerve showed decreased conduction velocity (Elbow-Wrist, 47 m/s).  The right median motor nerve showed prolonged distal onset latency (5.5 ms) and decreased conduction velocity (Elbow-Wrist, 42 m/s).  The left radial motor nerve showed prolonged distal onset latency (2.9 ms).  The left median (across palm) sensory nerve showed prolonged distal peak latency (Wrist, 4.2 ms) and prolonged distal peak latency (Palm, 4.6 ms).  The right median (across palm)  sensory nerve showed no response (Palm) and prolonged distal peak latency (5.9 ms).  All remaining nerves (as indicated in the following tables) were within normal limits.  Left vs. Right side comparison data for the median motor nerve indicates abnormal L-R latency difference (1.4 ms).  All remaining left vs. right side differences were within normal limits.    All examined muscles  (as indicated in the following table) showed no evidence of electrical instability.    Impression: The above electrodiagnostic study is ABNORMAL and reveals evidence of:  1.  A moderate right median nerve entrapment at the wrist (carpal tunnel syndrome) affecting sensory and motor components.   2. A mild to moderate left median nerve entrapment at the wrist (carpal tunnel syndrome) affecting sensory and motor components.   There is no significant electrodiagnostic evidence of any other focal nerve entrapment, brachial plexopathy or cervical radiculopathy.    As you know, this particular electrodiagnostic study cannot rule out chemical radiculitis or sensory only radiculopathy.  **This electrodiagnostic study cannot rule out small fiber polyneuropathy and dysesthesias from central pain sensitization syndromes such as fibromyalgia.  Myotomal referral pain from trigger points is also not excluded.  Recommendations: 1.  Follow-up with referring physician. 2.  Continue current management of symptoms. 3.  Continue use of resting splint at night-time and as needed during the day. 4.  Suggest surgical evaluation.  ___________________________ Laurence Spates FAAPMR Board Certified, American Board of Physical Medicine and Rehabilitation    Nerve Conduction Studies Anti Sensory Summary Table   Stim Site NR Peak (ms) Norm Peak (ms) P-T Amp (V) Norm P-T Amp Site1 Site2 Delta-P (ms) Dist (cm) Vel (m/s) Norm Vel (m/s)  Left Median Acr Palm Anti Sensory (2nd Digit)  31.5C  Wrist    *4.2 <3.6 18.6 >10 Wrist Palm 0.4 0.0    Palm    *4.6 <2.0 25.4         Right Median Acr Palm Anti Sensory (2nd Digit)  32.1C  Wrist    *5.9 <3.6 15.6 >10 Wrist Palm  0.0    Palm *NR  <2.0          Left Radial Anti Sensory (Base 1st Digit)  31.6C  Wrist    2.1 <3.1 42.6  Wrist Base 1st Digit 2.1 0.0    Right Radial Anti Sensory (Base 1st Digit)  31.5C  Wrist    2.0 <3.1 29.1  Wrist Base 1st Digit 2.0 0.0    Left  Ulnar Anti Sensory (5th Digit)  31.6C  Wrist    3.2 <3.7 27.2 >15.0 Wrist 5th Digit 3.2 14.0 44 >38  Right Ulnar Anti Sensory (5th Digit)  31.9C  Wrist    3.1 <3.7 19.9 >15.0 Wrist 5th Digit 3.1 14.0 45 >38   Motor Summary Table   Stim Site NR Onset (ms) Norm Onset (ms) O-P Amp (mV) Norm O-P Amp Site1 Site2 Delta-0 (ms) Dist (cm) Vel (m/s) Norm Vel (m/s)  Left Median Motor (Abd Poll Brev)  31.5C  Wrist    4.1 <4.2 8.8 >5 Elbow Wrist 4.3 20.0 *47 >50  Elbow    8.4  8.4         Right Median Motor (Abd Poll Brev)  31.3C  Wrist    *5.5 <4.2 8.6 >5 Elbow Wrist 5.0 21.0 *42 >50  Elbow    10.5  8.8         Left Radial Motor (Ext Indicis)  31.5C  8cm    *2.9 <2.5 9.4 >1.7  Up Arm 8cm 2.7 17.0 63 >60  Up Arm    5.6  8.7  Axilla Up Arm 1.4 10.0 71   Axilla    7.0  8.8         Right Ulnar Motor (Abd Dig Min)  31.1C  Wrist    2.9 <4.2 9.6 >3 B Elbow Wrist 3.2 19.0 59 >53  B Elbow    6.1  9.1  A Elbow B Elbow 1.2 11.0 92 >53  A Elbow    7.3  9.2          EMG   Side Muscle Nerve Root Ins Act Fibs Psw Amp Dur Poly Recrt Int Fraser Din Comment  Right Abd Poll Brev Median C8-T1 Nml Nml Nml Nml Nml 0 Nml Nml   Right 1stDorInt Ulnar C8-T1 Nml Nml Nml Nml Nml 0 Nml Nml   Right PronatorTeres Median C6-7 Nml Nml Nml Nml Nml 0 Nml Nml   Right Biceps Musculocut C5-6 Nml Nml Nml Nml Nml 0 Nml Nml   Right Deltoid Axillary C5-6 Nml Nml Nml Nml Nml 0 Nml Nml     Nerve Conduction Studies Anti Sensory Left/Right Comparison   Stim Site L Lat (ms) R Lat (ms) L-R Lat (ms) L Amp (V) R Amp (V) L-R Amp (%) Site1 Site2 L Vel (m/s) R Vel (m/s) L-R Vel (m/s)  Median Acr Palm Anti Sensory (2nd Digit)  31.5C  Wrist *4.2 *5.9 1.7 18.6 15.6 16.1 Wrist Palm     Palm *4.6   25.4         Radial Anti Sensory (Base 1st Digit)  31.6C  Wrist 2.1 2.0 0.1 42.6 29.1 31.7 Wrist Base 1st Digit     Ulnar Anti Sensory (5th Digit)  31.6C  Wrist 3.2 3.1 0.1 27.2 19.9 26.8 Wrist 5th Digit 44 45 1   Motor Left/Right  Comparison   Stim Site L Lat (ms) R Lat (ms) L-R Lat (ms) L Amp (mV) R Amp (mV) L-R Amp (%) Site1 Site2 L Vel (m/s) R Vel (m/s) L-R Vel (m/s)  Median Motor (Abd Poll Brev)  31.5C  Wrist 4.1 *5.5 *1.4 8.8 8.6 2.3 Elbow Wrist *47 *42 5  Elbow 8.4 10.5 2.1 8.4 8.8 4.5       Radial Motor (Ext Indicis)  31.5C  8cm *2.9   9.4   Up Arm 8cm 63    Up Arm 5.6   8.7   Axilla Up Arm 71    Axilla 7.0   8.8         Ulnar Motor (Abd Dig Min)  31.1C  Wrist  2.9   9.6  B Elbow Wrist  59   B Elbow  6.1   9.1  A Elbow B Elbow  92   A Elbow  7.3   9.2           Waveforms:                     Clinical History: MRI LUMBAR SPINE WITHOUT CONTRAST  TECHNIQUE: Multiplanar, multisequence MR imaging of the lumbar spine was performed. No intravenous contrast was administered.  COMPARISON:  Lumbar spine radiograph 04/17/2017  FINDINGS: Segmentation:  Standard.  Alignment:  Grade 1 L4-L5 anterolisthesis  Vertebrae:  Bilateral facet edema at L4-L5.  Conus medullaris: Extends to the L1 level and appears normal.  Paraspinal and other soft tissues: The visualized aorta, IVC and iliac vessels are normal. The visualized retroperitoneal organs and paraspinal soft tissues are  normal.  Disc levels:  T12-L1: Normal disc space and facets. No spinal canal or neuroforaminal stenosis.  L1-L2: Normal disc space and facets. No spinal canal or neuroforaminal stenosis.  L2-L3: Normal disc space and facets. No spinal canal or neuroforaminal stenosis.  L3-L4: Normal disc space and facets. No spinal canal or neuroforaminal stenosis.  L4-L5: Grade 1 anterolisthesis. There may be bilateral pars interarticularis defects. There is severe bilateral facet hypertrophy with fluid filling the widened facet joint spaces. There is a small central disc protrusion with annular fissure. Mild spinal canal stenosis. Mild left foraminal stenosis.  L5-S1: Small central disc protrusion with annular  fissure. Mild facet hypertrophy. No spinal canal stenosis. No neural foraminal stenosis.  Visualized sacrum: Normal.  IMPRESSION: 1. Severe facet arthrosis at L4-L5 with widening of the joint spaces, which are fluid-filled. This may indicate a degree of segmental instability. 2. Grade 1 L4-L5 anterolisthesis, possibly due to pars interarticularis defects. This could be confirmed with dedicated the oblique radiography of the lumbar spine. 3. Small central disc protrusions at L4-L5 and L5-S1 with mild spinal canal stenosis at L4-5. No L5-S1 stenosis.   Electronically Signed   By: Ulyses Jarred M.D.   On: 05/10/2017 02:45     Objective:  VS:  HT:     WT:    BMI:      BP:    HR: bpm   TEMP: ( )   RESP:  Physical Exam Musculoskeletal:        General: No swelling, tenderness or deformity.     Comments: Inspection reveals no atrophy of the bilateral APB or FDI or hand intrinsics. There is no swelling, color changes, allodynia or dystrophic changes. There is 5 out of 5 strength in the bilateral wrist extension, finger abduction and long finger flexion.  There is some impaired sensation although it is not a great exam and more of a median nerve distribution on the right. There is a negative Hoffmann's test bilaterally.  Patient will not really move the arms very quickly and with any sort of motion seems to have pain with any sort of activity.  Skin:    General: Skin is warm and dry.     Findings: No erythema or rash.  Neurological:     General: No focal deficit present.     Mental Status: She is alert and oriented to person, place, and time.     Motor: No weakness or abnormal muscle tone.     Coordination: Coordination normal.  Psychiatric:        Mood and Affect: Mood normal.        Behavior: Behavior normal.     Ortho Exam Imaging: No results found.

## 2019-02-19 ENCOUNTER — Ambulatory Visit: Payer: 59 | Admitting: Orthopaedic Surgery

## 2019-02-19 ENCOUNTER — Other Ambulatory Visit: Payer: Self-pay

## 2019-02-19 ENCOUNTER — Encounter: Payer: Self-pay | Admitting: Orthopaedic Surgery

## 2019-02-19 ENCOUNTER — Ambulatory Visit (INDEPENDENT_AMBULATORY_CARE_PROVIDER_SITE_OTHER): Payer: 59 | Admitting: Orthopaedic Surgery

## 2019-02-19 DIAGNOSIS — G5601 Carpal tunnel syndrome, right upper limb: Secondary | ICD-10-CM | POA: Diagnosis not present

## 2019-02-19 DIAGNOSIS — G5602 Carpal tunnel syndrome, left upper limb: Secondary | ICD-10-CM | POA: Diagnosis not present

## 2019-02-19 MED ORDER — BUPIVACAINE HCL 0.5 % IJ SOLN
1.0000 mL | INTRAMUSCULAR | Status: AC | PRN
  Administered 2019-02-19: 1 mL

## 2019-02-19 MED ORDER — BUPIVACAINE HCL 0.5 % IJ SOLN
1.0000 mL | INTRAMUSCULAR | Status: AC | PRN
  Administered 2019-02-19: 10:00:00 1 mL

## 2019-02-19 MED ORDER — METHYLPREDNISOLONE ACETATE 40 MG/ML IJ SUSP
40.0000 mg | INTRAMUSCULAR | Status: AC | PRN
  Administered 2019-02-19: 40 mg

## 2019-02-19 MED ORDER — LIDOCAINE HCL 1 % IJ SOLN
1.0000 mL | INTRAMUSCULAR | Status: AC | PRN
  Administered 2019-02-19: 10:00:00 1 mL

## 2019-02-19 NOTE — Progress Notes (Signed)
Office Visit Note   Patient: Julia Green           Date of Birth: Oct 26, 1970           MRN: [id number] Visit Date: 02/19/2019              Requested by: Cathleen Corti, PA-C [place],  [place] [address] PCP: Cathleen Corti, PA-C   Assessment & Plan: Visit Diagnoses:  1. Right carpal tunnel syndrome   2. Left carpal tunnel syndrome     Plan: Nerve conduction studies reveal moderate right carpal tunnel syndrome and mild to moderate left carpal tunnel syndrome.  She also has chronic pain syndrome and overall she appears to be very depressed about how much pain she lives with.  In terms of the carpal tunnel syndrome we discussed nonsurgical and surgical treatment options and she elected to proceed with bilateral carpal tunnel injections for now.  She would also like to inquire about the surgical cost of a carpal tunnel release.  I have recommended that if she is interested in having this done that we will start with the right hand first.  Today's encounter was performed through an interpreter which increase the complexity.  Patient tolerated the carpal tunnel injections well today.  Follow-Up Instructions: Return if symptoms worsen or fail to improve.   Orders:  No orders of the defined types were placed in this encounter.  No orders of the defined types were placed in this encounter.     Procedures: Hand/UE Inj: bilateral carpal tunnel for carpal tunnel syndrome on 02/19/2019 9:47 AM Indications: pain Details: 25 G needle Medications (Right): 1 mL lidocaine 1 %; 1 mL bupivacaine 0.5 %; 40 mg methylPREDNISolone acetate 40 MG/ML Medications (Left): 1 mL lidocaine 1 %; 1 mL bupivacaine 0.5 %; 40 mg methylPREDNISolone acetate 40 MG/ML Outcome: tolerated well, no immediate complications Patient was prepped and draped in the usual sterile fashion.       Clinical Data: No additional findings.   Subjective: Chief Complaint  Patient presents with  .  Right Hand - Follow-up  . Left Hand - Follow-up    Charleston Ropes comes in today for review of her nerve conduction studies.  She presents with her interpreter.  She has chronic pain in her shoulders and neck and low back.  Denies any changes in medical history.   Review of Systems  Constitutional: Negative.   HENT: Negative.   Eyes: Negative.   Respiratory: Negative.   Cardiovascular: Negative.   Endocrine: Negative.   Musculoskeletal: Negative.   Neurological: Negative.   Hematological: Negative.   Psychiatric/Behavioral: Negative.   All other systems reviewed and are negative.    Objective: Vital Signs: LMP 03/24/2017   Physical Exam Vitals signs and nursing note reviewed.  Constitutional:      Appearance: She is well-developed.  HENT:     Head: Normocephalic and atraumatic.  Neck:     Musculoskeletal: Neck supple.  Pulmonary:     Effort: Pulmonary effort is normal.  Abdominal:     Palpations: Abdomen is soft.  Skin:    General: Skin is warm.     Capillary Refill: Capillary refill takes less than 2 seconds.  Neurological:     Mental Status: She is alert and oriented to person, place, and time.  Psychiatric:        Behavior: Behavior normal.        Thought Content: Thought content normal.  Judgment: Judgment normal.     Ortho Exam MSK exams are stable. Specialty Comments:  No specialty comments available.  Imaging: No results found.   PMFS History: Patient Active Problem List   Diagnosis Date Noted  . Right low back pain 07/17/2018  . Neck pain 07/17/2018  . Postoperative state 06/26/2017  . Rotator cuff syndrome of right shoulder 05/30/2017  . 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  . Abdominal bloating 03/22/2017  . Other specified hypothyroidism 11/16/2016  . Dysmenorrhea 07/20/2016  . 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  . Dyspareunia, female 06/16/2016   Past Medical History:  Diagnosis Date  . Arthritis   .  Asthma   . Depression   . Fatty liver   . Gallstones   . GERD (gastroesophageal reflux disease)   . Headache   . History of low potassium   . Hypertension   . Hypothyroidism   . Right arm numbness   . Right leg numbness   . Sciatic nerve pain   . Thyroid disease     Family History  Problem Relation Age of Onset  . Hypertension Mother   . Uterine cancer Mother   . Cancer Sister        unknown  . Stomach cancer Maternal Aunt   . Colon cancer Neg Hx   . Esophageal cancer Neg Hx   . Rectal cancer Neg Hx     Past Surgical History:  Procedure Laterality Date  . COLONOSCOPY    . LAPAROSCOPIC VAGINAL HYSTERECTOMY WITH SALPINGECTOMY Bilateral 06/26/2017   Procedure: LAPAROSCOPIC ASSISTED VAGINAL HYSTERECTOMY WITH SALPINGECTOMY;  Surgeon: Princess Bruins, MD;  Location: [facility];  Service: Gynecology;  Laterality: Bilateral;  request 7:30am OR time  requests 2 hours Rex the lighted retractor rep will be here   . UPPER GI ENDOSCOPY     Social History   Occupational History  . Not on file  Tobacco Use  . Smoking status: Never Smoker  . Smokeless tobacco: Never Used  Substance and Sexual Activity  . Alcohol use: No  . Drug use: No  . Sexual activity: Yes    Birth control/protection: None

## 2019-04-17 ENCOUNTER — Ambulatory Visit (INDEPENDENT_AMBULATORY_CARE_PROVIDER_SITE_OTHER): Payer: 59 | Admitting: Orthopaedic Surgery

## 2019-04-17 ENCOUNTER — Encounter: Payer: Self-pay | Admitting: Orthopaedic Surgery

## 2019-04-17 DIAGNOSIS — G5603 Carpal tunnel syndrome, bilateral upper limbs: Secondary | ICD-10-CM

## 2019-04-17 MED ORDER — TRAMADOL HCL 50 MG PO TABS: 50.0000 mg | ORAL_TABLET | Freq: Four times a day (QID) | ORAL | 0 refills | Status: DC | PRN

## 2019-04-17 NOTE — Progress Notes (Signed)
Office Visit Note   Patient: Julia Green           Date of Birth: 12/22/1970           MRN: [id number] Visit Date: 04/17/2019              Requested by: Cathleen Corti, PA-C [address] [place],   [id number] PCP: Cathleen Corti, PA-C   Assessment & Plan: Visit Diagnoses:  1. Bilateral carpal tunnel syndrome     Plan: Impression is bilateral carpal tunnel syndrome moderate severity to the right mild to moderate to the left.  The patient only had approximately 3 weeks relief following bilateral carpal tunnel cortisone injections.  We have discussed surgical intervention to include carpal tunnel release with the right one being first.  She would like to know the cost of surgery before proceeding.  Barbera Setters will be in touch with her over the phone.  Spanish interpreter present today.  Follow-Up Instructions: Return if symptoms worsen or fail to improve.   Orders:  No orders of the defined types were placed in this encounter.  Meds ordered this encounter  Medications  . traMADol (ULTRAM) 50 MG tablet    Sig: Take 1 tablet (50 mg total) by mouth every 6 (six) hours as needed.    Dispense:  30 tablet    Refill:  0      Procedures: No procedures performed   Clinical Data: No additional findings.   Subjective: Chief Complaint  Patient presents with  . Right Hand - Follow-up  . Left Hand - Follow-up    HPI patient is a 48 year old Spanish-speaking female who presents our clinic today with bilateral hand pain right greater than left.  She is here with a Spanish-speaking interpreter.  History of bilateral carpal tunnel syndrome moderate on the right and mild to moderate on the left seen on recent nerve conduction studies.  She was seen in our office on 02/19/2019 where both carpal tunnel were injected with cortisone.  She noticed moderate relief of symptoms to both wrist but only lasted approximately 3 weeks.  Her pain has returned and has  significantly worsened.  Review of Systems as detailed in HPI.  All others reviewed and are negative.   Objective: Vital Signs: LMP 03/24/2017   Physical Exam well-developed and well-nourished female in no acute distress.  Alert and oriented x3.  Ortho Exam stable exam both wrist.  Specialty Comments:  No specialty comments available.  Imaging: No new imaging   PMFS History: Patient Active Problem List   Diagnosis Date Noted  . Right low back pain 07/17/2018  . Neck pain 07/17/2018  . Postoperative state 06/26/2017  . Rotator cuff syndrome of right shoulder 05/30/2017  . 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  . Abdominal bloating 03/22/2017  . Other specified hypothyroidism 11/16/2016  . Dysmenorrhea 07/20/2016  . 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  . Dyspareunia, female 06/16/2016   Past Medical History:  Diagnosis Date  . Arthritis   . Asthma   . Depression   . Fatty liver   . Gallstones   . GERD (gastroesophageal reflux disease)   . Headache   . History of low potassium   . Hypertension   . Hypothyroidism   . Right arm numbness   . Right leg numbness   . Sciatic nerve pain   . Thyroid disease     Family History  Problem Relation Age of Onset  . Hypertension Mother   .  Uterine cancer Mother   . Cancer Sister        unknown  . Stomach cancer Maternal Aunt   . Colon cancer Neg Hx   . Esophageal cancer Neg Hx   . Rectal cancer Neg Hx     Past Surgical History:  Procedure Laterality Date  . COLONOSCOPY    . LAPAROSCOPIC VAGINAL HYSTERECTOMY WITH SALPINGECTOMY Bilateral 06/26/2017   Procedure: LAPAROSCOPIC ASSISTED VAGINAL HYSTERECTOMY WITH SALPINGECTOMY;  Surgeon: Princess Bruins, MD;  Location: [facility];  Service: Gynecology;  Laterality: Bilateral;  request 7:30am OR time  requests 2 hours Rex the lighted retractor rep will be here   . UPPER GI ENDOSCOPY     Social History   Occupational History  . Not on file   Tobacco Use  . Smoking status: Never Smoker  . Smokeless tobacco: Never Used  Substance and Sexual Activity  . Alcohol use: No  . Drug use: No  . Sexual activity: Yes    Birth control/protection: None

## 2019-07-05 ENCOUNTER — Ambulatory Visit (INDEPENDENT_AMBULATORY_CARE_PROVIDER_SITE_OTHER): Payer: 59 | Admitting: Obstetrics & Gynecology

## 2019-07-05 ENCOUNTER — Other Ambulatory Visit: Payer: Self-pay

## 2019-07-05 ENCOUNTER — Encounter: Payer: Self-pay | Admitting: Obstetrics & Gynecology

## 2019-07-05 VITALS — BP 128/86

## 2019-07-05 DIAGNOSIS — N9411 Superficial (introital) dyspareunia: Secondary | ICD-10-CM | POA: Diagnosis not present

## 2019-07-05 DIAGNOSIS — R6882 Decreased libido: Secondary | ICD-10-CM

## 2019-07-05 DIAGNOSIS — N952 Postmenopausal atrophic vaginitis: Secondary | ICD-10-CM | POA: Diagnosis not present

## 2019-07-05 MED ORDER — EST ESTROGENS-METHYLTEST 1.25-2.5 MG PO TABS: 1.0000 | ORAL_TABLET | Freq: Every day | ORAL | 4 refills | Status: DC

## 2019-07-05 NOTE — Patient Instructions (Signed)
1. Low libido Will try Estratest instead of estradiol hormone replacement therapy only, which patient was not taking anyways because of cost issues.  Usage risks and benefits reviewed with patient.  Status post total hysterectomy.  2. Superficial dyspareunia Because of cost issue, decision to send compound estradiol cream to [facility].  Usage reviewed.  Recommend to use the estradiol cream at the anal area as well.  Use extra lubricant for anal sex.  Patient also counseled on the possibility of not agreeing with anal sex if not pleasant.  3. Post-menopausal atrophic vaginitis Will use compounded estrogen cream.  Usage reviewed and prescription sent to [facility].  Other orders - estrogens-methylTEST (ESTRATEST) 1.25-2.5 MG tablet; Take 1 tablet by mouth daily.  Julia Green, fue un placer verle hoy!

## 2019-07-05 NOTE — Progress Notes (Signed)
    Julia Green 11-Nov-1970 [id number]        48 y.o.  G4P4L4 Married  RP: Postmenopausal with low libido and superficial dyspareunia  HPI: Status post total hysterectomy.  Postmenopausal.  Patient has not been taking our hormone replacement therapy because of cost.  Complains of ongoing low libido.  Has superficial dyspareunia and complains of pain with anal sex in spite of using lubricant.    OB History  Gravida Para Term Preterm AB Living  4 4       4   SAB TAB Ectopic Multiple Live Births               # Outcome Date GA Lbr Len/2nd Weight Sex Delivery Anes PTL Lv  4 Para           3 Para           2 Para           1 Para             Past medical history,surgical history, problem list, medications, allergies, family history and social history were all reviewed and documented in the EPIC chart.   Directed ROS with pertinent positives and negatives documented in the history of present illness/assessment and plan.  Exam:  Vitals:   07/05/19 1530  BP: 128/86   General appearance:  Normal  Gynecologic exam: Vulva mildly atrophic d/t menopause, but no lesion and no inflammation.  Perianal area wnl.   Assessment/Plan:  48 y.o. G4P4   1. Low libido Will try Estratest instead of estradiol hormone replacement therapy only, which patient was not taking anyways because of cost issues.  Usage risks and benefits reviewed with patient.  Status post total hysterectomy.  2. Superficial dyspareunia Because of cost issue, decision to send compound estradiol cream to [facility].  Usage reviewed.  Recommend to use the estradiol cream at the anal area as well.  Use extra lubricant for anal sex.  Patient also counseled on the possibility of not agreeing with anal sex if not pleasant.  3. Post-menopausal atrophic vaginitis Will use compounded estrogen cream.  Usage reviewed and prescription sent to [facility].  Other orders - estrogens-methylTEST (ESTRATEST) 1.25-2.5 MG  tablet; Take 1 tablet by mouth daily.  Counseling on above issues and coordination of care more than 50% for 25 minutes.  Princess Bruins MD, 3:36 PM 07/05/2019

## 2019-07-08 ENCOUNTER — Telehealth: Payer: Self-pay | Admitting: *Deleted

## 2019-07-08 NOTE — Telephone Encounter (Signed)
Prior authorization for Estatest 1.25-2.5 mg tablet was denied by [employer] medication is not a paid benefit. Pharmacy informed as well.

## 2019-07-08 NOTE — Telephone Encounter (Signed)
-----   Message from Princess Bruins, MD sent at 07/05/2019  3:56 PM EST ----- Regarding: Compound estrogen to [facility] Compound Estradiol cream 1/4 of applicator twice a week x 1 year.  Can apply daily x 2 first weeks.

## 2019-07-10 IMAGING — CT CT ABDOMEN W/ CM
2 of 5 series · 17 of 46 positions shown, 19 images · IV contrast (iopamidol)
Comparison: None.

CLINICAL DATA: Mid abdominal pain, diarrhea, bloating and 10 pound
weight loss.

EXAM:
CT ABDOMEN WITH CONTRAST
TECHNIQUE: Multidetector CT imaging of the abdomen was performed using the
standard protocol following bolus administration of intravenous
contrast.
CONTRAST:  80mL 0OKH3Z-NFF IOPAMIDOL (0OKH3Z-NFF) INJECTION 61%

[Series 2: abd/pel w · axial · 0.70mm/px · z∈[-284,-54]mm · 14 of 54 slices shown, 16 images]
[im 4/54  soft-tissue]
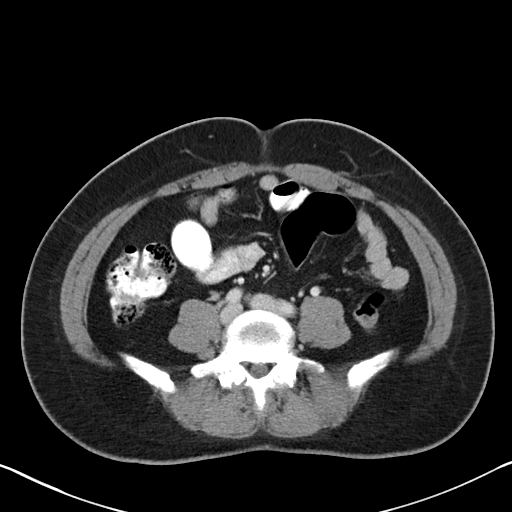
[im 4/54  bone]
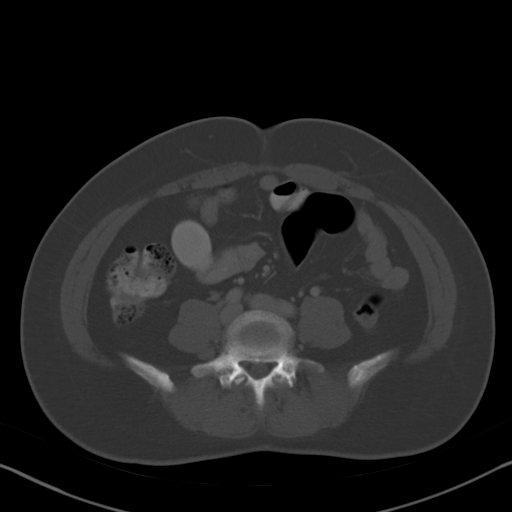
[im 8/54  soft-tissue]
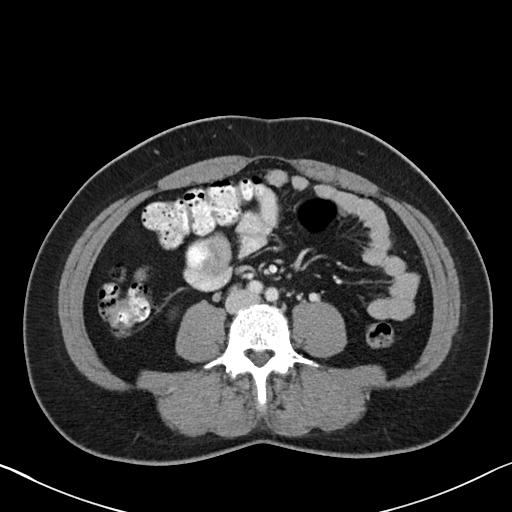
[im 11/54  soft-tissue]
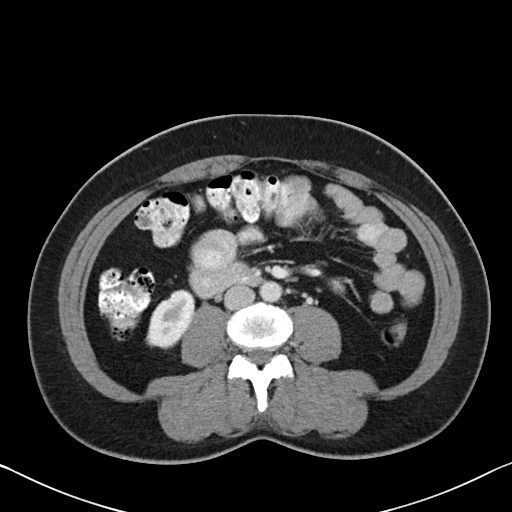
[im 15/54  soft-tissue]
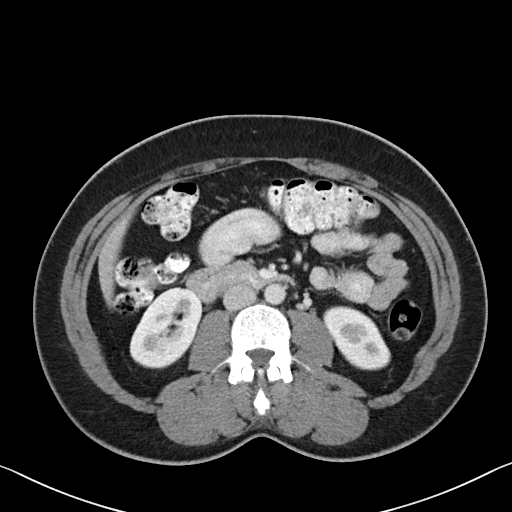
[im 18/54  soft-tissue]
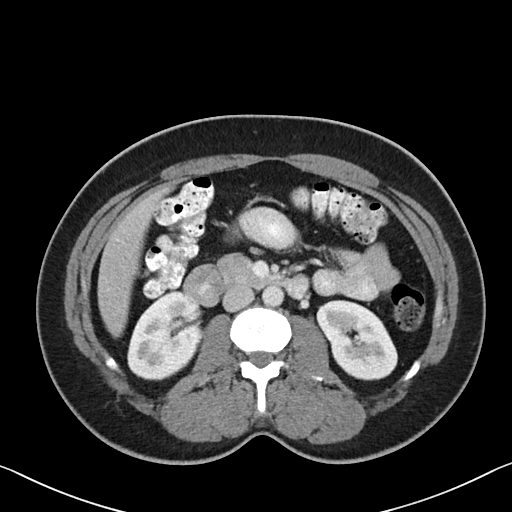
[im 22/54  soft-tissue]
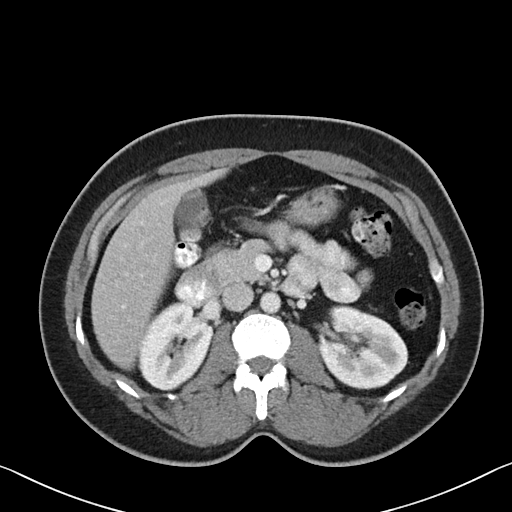
[im 25/54  soft-tissue]
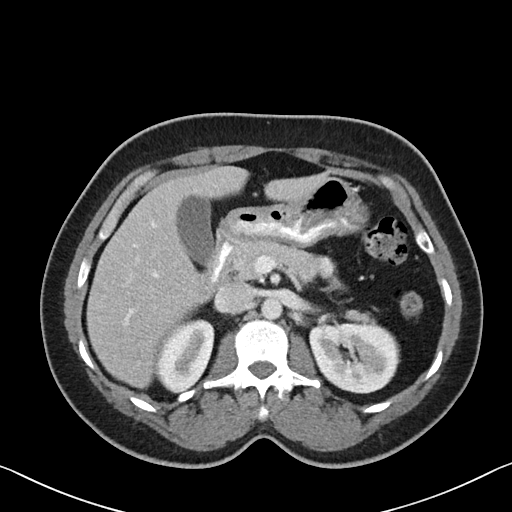
[im 29/54  soft-tissue]
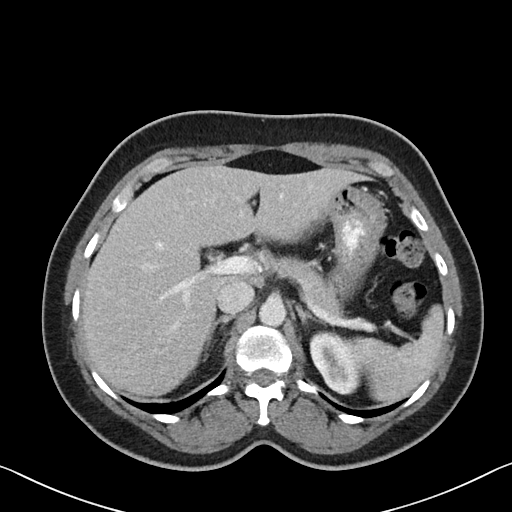
[im 32/54  soft-tissue]
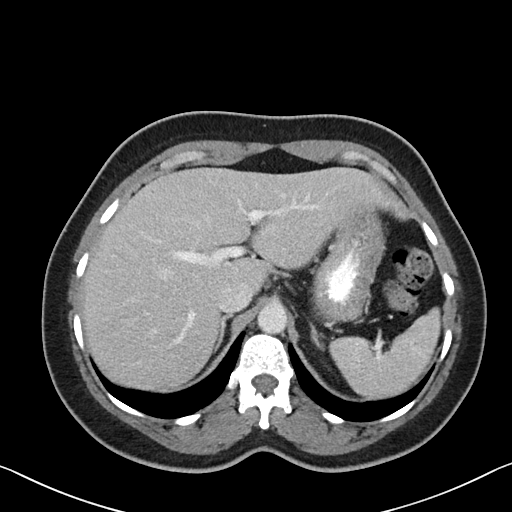
[im 32/54  bone]
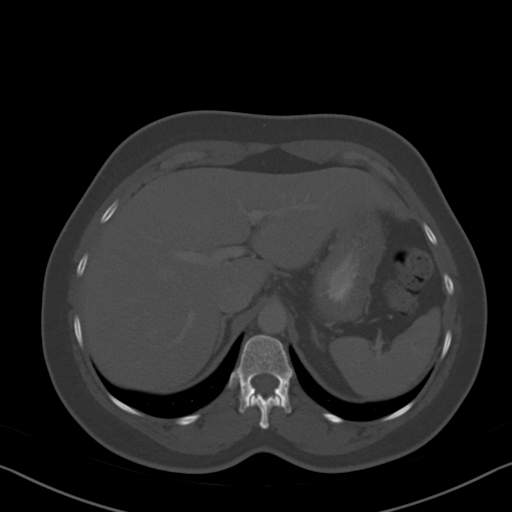
[im 36/54  soft-tissue]
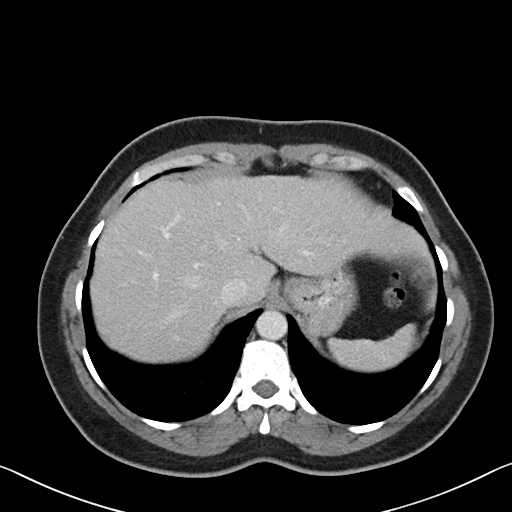
[im 39/54  soft-tissue]
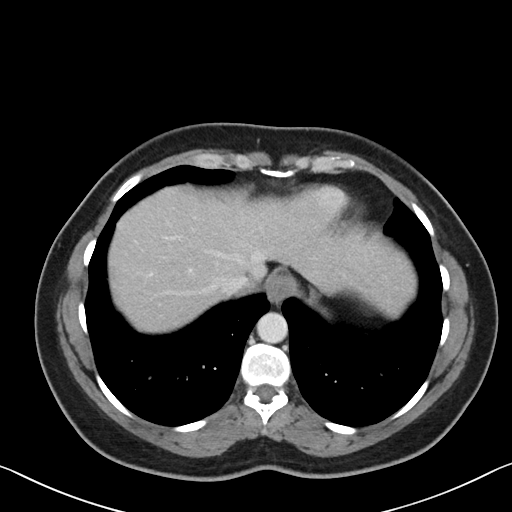
[im 43/54  soft-tissue]
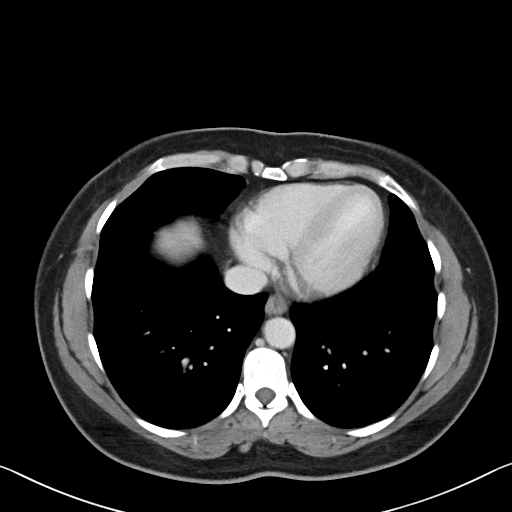
[im 46/54  soft-tissue]
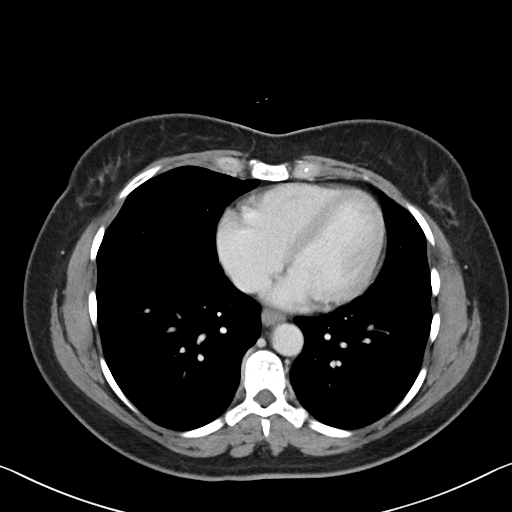
[im 50/54  soft-tissue]
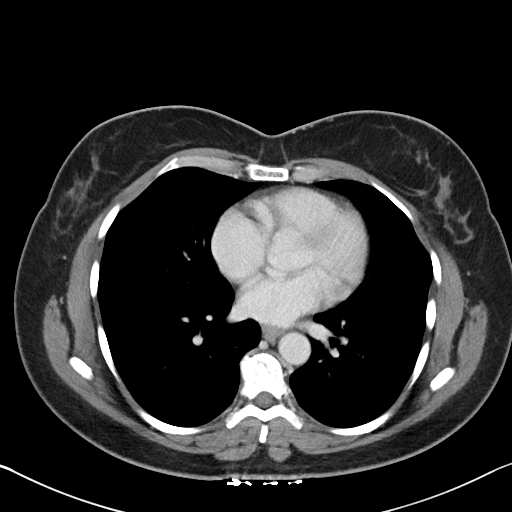

[Series 6: abd/pel w st · coronal · 0.52mm/px · 3 of 77 slices shown]
[im 26/77  soft-tissue]
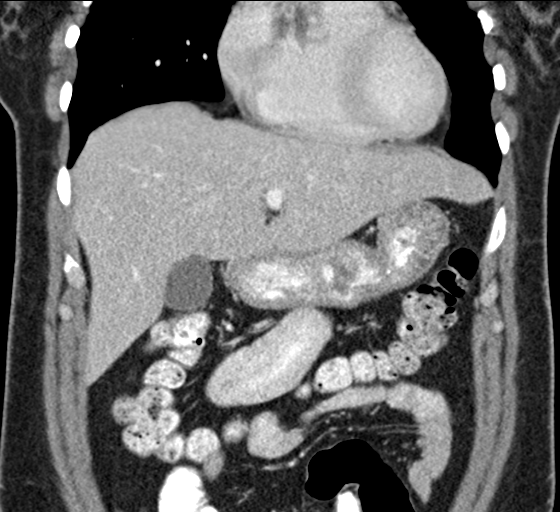
[im 34/77  soft-tissue]
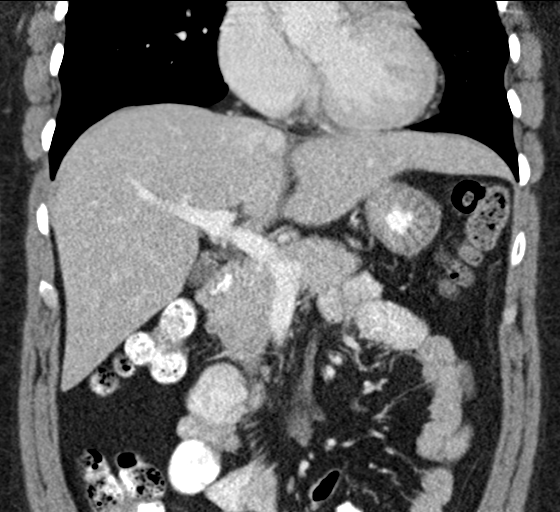
[im 43/77  soft-tissue]
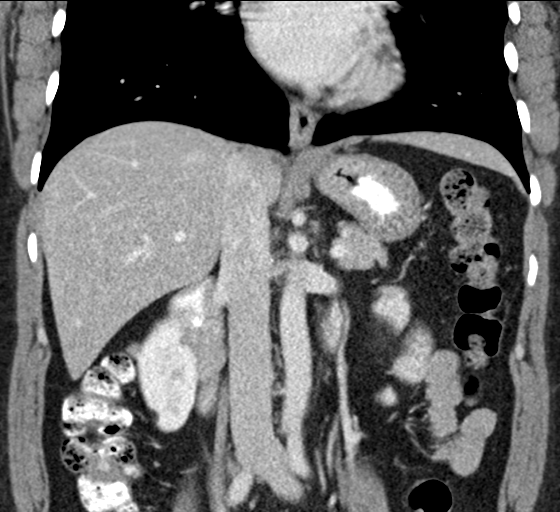

[17 of 46 positions shown; findings below may reference images not displayed]

FINDINGS: Lower chest: The lung bases are clear of acute process. No pleural
effusion or pulmonary lesions. The heart is normal in size. No
pericardial effusion. The distal esophagus and aorta are
unremarkable.

Hepatobiliary: No focal hepatic lesions or intrahepatic biliary
dilatation. Suspect small gallstones in the gallbladder. No findings
for acute cholecystitis. Normal caliber common bile duct.

Pancreas: No mass, inflammation or ductal dilatation.

Spleen: Normal size.  No focal lesions.

Adrenals/Urinary Tract: The adrenal glands and kidneys are
unremarkable.

Stomach/Bowel: The stomach, duodenum, visualized small bowel and
visualized colon are unremarkable. No acute inflammatory process,
mass lesions or obstructive findings.

Vascular/Lymphatic: The aorta is normal in caliber. No dissection.
The branch vessels are patent. The major venous structures are
patent. No mesenteric or retroperitoneal mass or adenopathy. Small
scattered lymph nodes are noted.

Other: No ascites. Small periumbilical abdominal wall hernia
containing fat.

Musculoskeletal: No significant bony findings.
IMPRESSION: 1. No acute abdominal findings, mass lesions or adenopathy.
2. Possible small gallstones.
3. Small periumbilical abdominal wall hernia containing fat.

## 2019-07-10 MED ORDER — NONFORMULARY OR COMPOUNDED ITEM: 4 refills | Status: DC

## 2019-07-10 NOTE — Telephone Encounter (Signed)
Rx called in, in separate staff message said " fine with 1 gram twice weekly"

## 2019-07-16 NOTE — Telephone Encounter (Signed)
Dr.Lavoie patient medication for Estratest 1.25-2.13m is not covered by insurance and too expensive to pay out of pocket. Patient would like another Rx. Please advise

## 2019-07-17 MED ORDER — ESTRADIOL 1 MG PO TABS: 1.0000 mg | ORAL_TABLET | Freq: Every day | ORAL | 3 refills | Status: DC

## 2019-07-17 NOTE — Telephone Encounter (Signed)
Patient informed. 

## 2019-07-17 NOTE — Addendum Note (Signed)
Addended by: Thamas Jaegers on: 07/17/2019 09:05 AM   Modules accepted: Orders

## 2019-07-17 NOTE — Telephone Encounter (Signed)
I sent Rx to [facility] for patient, please let her know.

## 2019-07-17 NOTE — Telephone Encounter (Signed)
Can try Estradiol tablet 1 mg PO daily.  #30.  Refill x 12.

## 2019-07-23 ENCOUNTER — Other Ambulatory Visit: Payer: Self-pay

## 2019-07-31 ENCOUNTER — Encounter (HOSPITAL_BASED_OUTPATIENT_CLINIC_OR_DEPARTMENT_OTHER): Payer: Self-pay | Admitting: *Deleted

## 2019-07-31 ENCOUNTER — Other Ambulatory Visit: Payer: Self-pay

## 2019-08-03 ENCOUNTER — Other Ambulatory Visit (HOSPITAL_COMMUNITY)
Admission: RE | Admit: 2019-08-03 | Discharge: 2019-08-03 | Disposition: A | Discharge status: Home / Self Care | Payer: 59 | Source: Ambulatory Visit | Attending: Orthopaedic Surgery | Admitting: Orthopaedic Surgery

## 2019-08-03 DIAGNOSIS — Z01812 Encounter for preprocedural laboratory examination: Secondary | ICD-10-CM | POA: Diagnosis not present

## 2019-08-03 DIAGNOSIS — Z20828 Contact with and (suspected) exposure to other viral communicable diseases: Secondary | ICD-10-CM | POA: Diagnosis not present

## 2019-08-04 LAB — NOVEL CORONAVIRUS, NAA (HOSP ORDER, SEND-OUT TO REF LAB; TAT 18-24 HRS): SARS-CoV-2, NAA: NOT DETECTED

## 2019-08-05 ENCOUNTER — Encounter (HOSPITAL_BASED_OUTPATIENT_CLINIC_OR_DEPARTMENT_OTHER)
Admission: RE | Admit: 2019-08-05 | Discharge: 2019-08-05 | Disposition: A | Discharge status: Home / Self Care | Payer: 59 | Source: Ambulatory Visit | Attending: Orthopaedic Surgery | Admitting: Orthopaedic Surgery

## 2019-08-05 ENCOUNTER — Other Ambulatory Visit: Payer: Self-pay

## 2019-08-05 DIAGNOSIS — G5601 Carpal tunnel syndrome, right upper limb: Secondary | ICD-10-CM | POA: Diagnosis present

## 2019-08-05 DIAGNOSIS — E669 Obesity, unspecified: Secondary | ICD-10-CM | POA: Diagnosis not present

## 2019-08-05 DIAGNOSIS — Z7982 Long term (current) use of aspirin: Secondary | ICD-10-CM | POA: Diagnosis not present

## 2019-08-05 DIAGNOSIS — E039 Hypothyroidism, unspecified: Secondary | ICD-10-CM | POA: Diagnosis not present

## 2019-08-05 DIAGNOSIS — I10 Essential (primary) hypertension: Secondary | ICD-10-CM | POA: Diagnosis not present

## 2019-08-05 DIAGNOSIS — Z79899 Other long term (current) drug therapy: Secondary | ICD-10-CM | POA: Diagnosis not present

## 2019-08-05 DIAGNOSIS — Z6830 Body mass index (BMI) 30.0-30.9, adult: Secondary | ICD-10-CM | POA: Diagnosis not present

## 2019-08-05 DIAGNOSIS — Z79890 Hormone replacement therapy (postmenopausal): Secondary | ICD-10-CM | POA: Diagnosis not present

## 2019-08-05 DIAGNOSIS — J45909 Unspecified asthma, uncomplicated: Secondary | ICD-10-CM | POA: Diagnosis not present

## 2019-08-05 LAB — BASIC METABOLIC PANEL
Anion gap: 14 (ref 5–15)
BUN: 13 mg/dL (ref 6–20)
CO2: 22 mmol/L (ref 22–32)
Calcium: 9.3 mg/dL (ref 8.9–10.3)
Chloride: 105 mmol/L (ref 98–111)
Creatinine, Ser: 1.11 mg/dL — ABNORMAL HIGH (ref 0.44–1.00)
GFR calc Af Amer: >60 mL/min (ref >60)
GFR calc non Af Amer: 60 mL/min — ABNORMAL LOW (ref >60)
Glucose, Bld: 83 mg/dL (ref 70–99)
Potassium: 3.8 mmol/L (ref 3.5–5.1)
Sodium: 141 mmol/L (ref 135–145)

## 2019-08-05 NOTE — Progress Notes (Signed)

## 2019-08-06 ENCOUNTER — Encounter (HOSPITAL_BASED_OUTPATIENT_CLINIC_OR_DEPARTMENT_OTHER): Payer: Self-pay | Admitting: Orthopaedic Surgery

## 2019-08-06 NOTE — Anesthesia Preprocedure Evaluation (Addendum)
Anesthesia Evaluation  Patient identified by MRN, date of birth, ID band Patient awake    Reviewed: Allergy & Precautions, NPO status , Patient's Chart, lab work & pertinent test results  Airway Mallampati: I  TM Distance: >3 FB Neck ROM: Full    Dental no notable dental hx. (+) Teeth Intact   Pulmonary asthma ,    Pulmonary exam normal breath sounds clear to auscultation       Cardiovascular hypertension, Pt. on medications Normal cardiovascular exam Rhythm:Regular Rate:Normal     Neuro/Psych  Headaches, PSYCHIATRIC DISORDERS Depression  Neuromuscular disease    GI/Hepatic Neg liver ROS, GERD  Medicated and Controlled,  Endo/Other  Hypothyroidism Obesity  Renal/GU negative Renal ROS  negative genitourinary   Musculoskeletal  (+) Arthritis , Osteoarthritis,  Spondylosis lumbar spine Right carpal tunnel syndrome   Abdominal (+) + obese,   Peds  Hematology negative hematology ROS (+)   Anesthesia Other Findings   Reproductive/Obstetrics                            Anesthesia Physical Anesthesia Plan  ASA: II  Anesthesia Plan: Bier Block and MAC and Bier Block-LIDOCAINE ONLY   Post-op Pain Management:    Induction:   PONV Risk Score and Plan: 3 and Midazolam, Ondansetron, Propofol infusion and Treatment may vary due to age or medical condition  Airway Management Planned: Natural Airway, Nasal Cannula and Simple Face Mask  Additional Equipment:   Intra-op Plan:   Post-operative Plan:   Informed Consent: I have reviewed the patients History and Physical, chart, labs and discussed the procedure including the risks, benefits and alternatives for the proposed anesthesia with the patient or authorized representative who has indicated his/her understanding and acceptance.     Dental advisory given  Plan Discussed with: CRNA and Surgeon  Anesthesia Plan Comments:         Anesthesia Quick Evaluation

## 2019-08-07 ENCOUNTER — Encounter (HOSPITAL_BASED_OUTPATIENT_CLINIC_OR_DEPARTMENT_OTHER): Payer: Self-pay | Admitting: Orthopaedic Surgery

## 2019-08-07 ENCOUNTER — Encounter (HOSPITAL_BASED_OUTPATIENT_CLINIC_OR_DEPARTMENT_OTHER)
Admission: RE | Disposition: A | Discharge status: Home / Self Care | Payer: Self-pay | Source: Home / Self Care | Attending: Orthopaedic Surgery

## 2019-08-07 ENCOUNTER — Ambulatory Visit (HOSPITAL_BASED_OUTPATIENT_CLINIC_OR_DEPARTMENT_OTHER): Payer: 59 | Admitting: Anesthesiology

## 2019-08-07 ENCOUNTER — Other Ambulatory Visit: Payer: Self-pay

## 2019-08-07 ENCOUNTER — Ambulatory Visit (HOSPITAL_BASED_OUTPATIENT_CLINIC_OR_DEPARTMENT_OTHER)
Admission: RE | Admit: 2019-08-07 | Discharge: 2019-08-07 | Disposition: A | Discharge status: Home / Self Care | Payer: 59 | Attending: Orthopaedic Surgery | Admitting: Orthopaedic Surgery

## 2019-08-07 DIAGNOSIS — E669 Obesity, unspecified: Secondary | ICD-10-CM | POA: Insufficient documentation

## 2019-08-07 DIAGNOSIS — G5601 Carpal tunnel syndrome, right upper limb: Secondary | ICD-10-CM | POA: Diagnosis not present

## 2019-08-07 DIAGNOSIS — Z7982 Long term (current) use of aspirin: Secondary | ICD-10-CM | POA: Insufficient documentation

## 2019-08-07 DIAGNOSIS — I10 Essential (primary) hypertension: Secondary | ICD-10-CM | POA: Insufficient documentation

## 2019-08-07 DIAGNOSIS — Z79890 Hormone replacement therapy (postmenopausal): Secondary | ICD-10-CM | POA: Insufficient documentation

## 2019-08-07 DIAGNOSIS — E039 Hypothyroidism, unspecified: Secondary | ICD-10-CM | POA: Insufficient documentation

## 2019-08-07 DIAGNOSIS — Z6830 Body mass index (BMI) 30.0-30.9, adult: Secondary | ICD-10-CM | POA: Insufficient documentation

## 2019-08-07 DIAGNOSIS — Z79899 Other long term (current) drug therapy: Secondary | ICD-10-CM | POA: Insufficient documentation

## 2019-08-07 DIAGNOSIS — J45909 Unspecified asthma, uncomplicated: Secondary | ICD-10-CM | POA: Insufficient documentation

## 2019-08-07 HISTORY — PX: CARPAL TUNNEL RELEASE: SHX101

## 2019-08-07 SURGERY — CARPAL TUNNEL RELEASE
Anesthesia: Monitor Anesthesia Care | Site: Hand | Laterality: Right

## 2019-08-07 MED ORDER — LACTATED RINGERS IV SOLN: INTRAVENOUS | Status: DC

## 2019-08-07 MED ORDER — OXYCODONE HCL 5 MG PO TABS: 5.0000 mg | ORAL_TABLET | Freq: Once | ORAL | Status: DC | PRN

## 2019-08-07 MED ORDER — BUPIVACAINE HCL (PF) 0.25 % IJ SOLN
INTRAMUSCULAR | Status: DC | PRN
  Administered 2019-08-07: 14 mL

## 2019-08-07 MED ORDER — CEFAZOLIN SODIUM-DEXTROSE 2-4 GM/100ML-% IV SOLN
2.0000 g | INTRAVENOUS | Status: AC
  Administered 2019-08-07: 2 g via INTRAVENOUS

## 2019-08-07 MED ORDER — OXYCODONE HCL 5 MG/5ML PO SOLN: 5.0000 mg | Freq: Once | ORAL | Status: DC | PRN

## 2019-08-07 MED ORDER — CHLORHEXIDINE GLUCONATE 4 % EX LIQD: 60.0000 mL | Freq: Once | CUTANEOUS | Status: DC

## 2019-08-07 MED ORDER — 0.9 % SODIUM CHLORIDE (POUR BTL) OPTIME
TOPICAL | Status: DC | PRN
  Administered 2019-08-07: 1000 mL

## 2019-08-07 MED ORDER — HYDROCODONE-ACETAMINOPHEN 5-325 MG PO TABS: 1.0000 | ORAL_TABLET | Freq: Three times a day (TID) | ORAL | 0 refills | Status: DC | PRN

## 2019-08-07 MED ORDER — ONDANSETRON HCL 4 MG/2ML IJ SOLN: 4.0000 mg | Freq: Once | INTRAMUSCULAR | Status: DC | PRN

## 2019-08-07 MED ORDER — FENTANYL CITRATE (PF) 100 MCG/2ML IJ SOLN
INTRAMUSCULAR | Status: DC | PRN
  Administered 2019-08-07 (×2): 50 ug via INTRAVENOUS

## 2019-08-07 MED ORDER — LIDOCAINE HCL (PF) 0.5 % IJ SOLN
INTRAMUSCULAR | Status: DC | PRN
  Administered 2019-08-07: 35 mL via INTRAVENOUS

## 2019-08-07 MED ORDER — PROMETHAZINE HCL 25 MG PO TABS: 25.0000 mg | ORAL_TABLET | Freq: Four times a day (QID) | ORAL | 1 refills | Status: DC | PRN

## 2019-08-07 MED ORDER — BUPIVACAINE HCL (PF) 0.25 % IJ SOLN
INTRAMUSCULAR | Status: AC
  Filled 2019-08-07: qty 30

## 2019-08-07 MED ORDER — FENTANYL CITRATE (PF) 100 MCG/2ML IJ SOLN: 25.0000 ug | INTRAMUSCULAR | Status: DC | PRN

## 2019-08-07 MED ORDER — PROPOFOL 500 MG/50ML IV EMUL
INTRAVENOUS | Status: DC | PRN
  Administered 2019-08-07: 100 ug/kg/min via INTRAVENOUS

## 2019-08-07 MED ORDER — ONDANSETRON HCL 4 MG/2ML IJ SOLN
INTRAMUSCULAR | Status: DC | PRN
  Administered 2019-08-07: 4 mg via INTRAVENOUS

## 2019-08-07 MED ORDER — MIDAZOLAM HCL 5 MG/5ML IJ SOLN
INTRAMUSCULAR | Status: DC | PRN
  Administered 2019-08-07: 2 mg via INTRAVENOUS

## 2019-08-07 SURGICAL SUPPLY — 49 items
BAND RUBBER #18 3X1/16 STRL (MISCELLANEOUS) ×4 IMPLANT
BLADE MINI RND TIP GREEN BEAV (BLADE) ×2 IMPLANT
BLADE SURG 15 STRL LF DISP TIS (BLADE) ×1 IMPLANT
BLADE SURG 15 STRL SS (BLADE) ×1
BNDG ELASTIC 3X5.8 VLCR STR LF (GAUZE/BANDAGES/DRESSINGS) ×2 IMPLANT
BNDG ELASTIC 4X5.8 VLCR STR LF (GAUZE/BANDAGES/DRESSINGS) ×1 IMPLANT
BNDG ESMARK 4X9 LF (GAUZE/BANDAGES/DRESSINGS) ×2 IMPLANT
BNDG PLASTER X FAST 3X3 WHT LF (CAST SUPPLIES) IMPLANT
BRUSH SCRUB EZ PLAIN DRY (MISCELLANEOUS) ×2 IMPLANT
CANISTER SUCT 1200ML W/VALVE (MISCELLANEOUS) ×2 IMPLANT
CORD BIPOLAR FORCEPS 12FT (ELECTRODE) ×2 IMPLANT
COVER BACK TABLE REUSABLE LG (DRAPES) ×2 IMPLANT
COVER MAYO STAND REUSABLE (DRAPES) ×2 IMPLANT
COVER WAND RF STERILE (DRAPES) IMPLANT
CUFF TOURN SGL QUICK 18X4 (TOURNIQUET CUFF) IMPLANT
DECANTER SPIKE VIAL GLASS SM (MISCELLANEOUS) IMPLANT
DRAPE EXTREMITY T 121X128X90 (DISPOSABLE) ×2 IMPLANT
DRAPE IMP U-DRAPE 54X76 (DRAPES) ×2 IMPLANT
DRAPE SURG 17X23 STRL (DRAPES) ×2 IMPLANT
GAUZE 4X4 16PLY RFD (DISPOSABLE) IMPLANT
GAUZE SPONGE 4X4 12PLY STRL (GAUZE/BANDAGES/DRESSINGS) ×2 IMPLANT
GAUZE XEROFORM 1X8 LF (GAUZE/BANDAGES/DRESSINGS) ×2 IMPLANT
GLOVE BIOGEL PI IND STRL 7.0 (GLOVE) ×1 IMPLANT
GLOVE BIOGEL PI INDICATOR 7.0 (GLOVE) ×1
GLOVE ECLIPSE 7.0 STRL STRAW (GLOVE) ×2 IMPLANT
GLOVE SKINSENSE NS SZ7.5 (GLOVE) ×1
GLOVE SKINSENSE STRL SZ7.5 (GLOVE) ×1 IMPLANT
GLOVE SURG SYN 7.5  E (GLOVE) ×1
GLOVE SURG SYN 7.5 E (GLOVE) ×1 IMPLANT
GLOVE SURG SYN 7.5 PF PI (GLOVE) ×1 IMPLANT
GOWN STRL REIN XL XLG (GOWN DISPOSABLE) ×2 IMPLANT
GOWN STRL REUS W/ TWL XL LVL3 (GOWN DISPOSABLE) ×2 IMPLANT
GOWN STRL REUS W/TWL XL LVL3 (GOWN DISPOSABLE) ×2
NDL HYPO 25X1 1.5 SAFETY (NEEDLE) IMPLANT
NEEDLE HYPO 25X1 1.5 SAFETY (NEEDLE) IMPLANT
NS IRRIG 1000ML POUR BTL (IV SOLUTION) ×2 IMPLANT
PACK BASIN DAY SURGERY FS (CUSTOM PROCEDURE TRAY) ×2 IMPLANT
PAD CAST 3X4 CTTN HI CHSV (CAST SUPPLIES) ×1 IMPLANT
PAD CAST 4YDX4 CTTN HI CHSV (CAST SUPPLIES) IMPLANT
PADDING CAST COTTON 3X4 STRL (CAST SUPPLIES) ×1
PADDING CAST COTTON 4X4 STRL (CAST SUPPLIES) ×1
STOCKINETTE 4X48 STRL (DRAPES) ×2 IMPLANT
SUT ETHILON 3 0 PS 1 (SUTURE) ×2 IMPLANT
SYR BULB 3OZ (MISCELLANEOUS) ×2 IMPLANT
SYR CONTROL 10ML LL (SYRINGE) IMPLANT
TOWEL GREEN STERILE FF (TOWEL DISPOSABLE) ×2 IMPLANT
TRAY DSU PREP LF (CUSTOM PROCEDURE TRAY) ×2 IMPLANT
TUBE CONNECTING 20X1/4 (TUBING) IMPLANT
UNDERPAD 30X36 HEAVY ABSORB (UNDERPADS AND DIAPERS) ×2 IMPLANT

## 2019-08-07 NOTE — Op Note (Signed)
   Carpal tunnel op note  DATE OF SURGERY:08/07/2019  PREOPERATIVE DIAGNOSIS:  Right carpal tunnel syndrome  POSTOPERATIVE DIAGNOSIS: same  PROCEDURE:  Right carpal tunnel release. CPT [id number]  SURGEON: Surgeon(s): Leandrew Koyanagi, MD  ASSIST: Madalyn Rob, PA-C; necessary for the timely completion of procedure  ANESTHESIA:  Regional  TOURNIQUET TIME: less than 20 minutes  BLOOD LOSS: Minimal.  COMPLICATIONS: None.  PATHOLOGY: None.  INDICATIONS: The patient is a 48 y.o. -year-old female who presented with carpal tunnel syndrome failing nonsurgical management, indicated for surgical release.  DESCRIPTION OF PROCEDURE: The patient was identified in the preoperative holding area.  The operative site was marked by the surgeon and confirmed by the patient.  He was brought back to the operating room.  Anesthesia was induced by the anesthesia team.  A well padded nonsterile tourniquet was placed. The operative extremity was prepped and draped in standard sterile fashion.  A timeout was performed.  Preoperative antibiotics were given.   A palmar incision was made about 5 mm ulnar to the thenar crease.  The palmar aponeurosis was exposed and divided in line with the skin incision. The palmaris brevis was visualized and divided.  The distal edge of the transcarpal ligament was identified. A hemostat was inserted into the carpal tunnel to protect the median nerve and the flexor tendons. Then, the transverse carpal ligament was released under direct visualization. Proximally, a subcutaneous tunnel was made allowing a Sewell retractor to be placed. Then, the distal portion of the antebrachial fascia was released. Distally, all fibrous bands were released. The median nerve was visualized, and the fat pad was exposed. Following release, local infiltration with 0.25% of Sensorcaine was given. The tourniquet was deflated. Hemostasis achieved.  Wound was irrigated and closed with 4-0 nylon sutures.  Sterile dressing applied. The patient was transferred to the recovery room in stable condition after all counts were correct.  POSTOPERATIVE PLAN: To start nerve gliding exercises as tolerated and no heavy lifting for four weeks.  Eduard Roux, M.D. [facility] 8:57 AM

## 2019-08-07 NOTE — Transfer of Care (Signed)
Immediate Anesthesia Transfer of Care Note  Patient: Julia Green  Procedure(s) Performed: RIGHT CARPAL TUNNEL RELEASE (Right Hand)  Patient Location: PACU  Anesthesia Type:MAC and Bier block  Level of Consciousness: awake, alert  and oriented  Airway & Oxygen Therapy: Patient Spontanous Breathing and Patient connected to face mask oxygen  Post-op Assessment: Report given to RN and Post -op Vital signs reviewed and stable  Post vital signs: Reviewed and stable  Last Vitals:  Vitals Value Taken Time  BP    Temp    Pulse 80 08/07/19 0908  Resp 18 08/07/19 0908  SpO2 100 % 08/07/19 0908  Vitals shown include unvalidated device data.  Last Pain:  Vitals:   08/07/19 0826  TempSrc: Temporal  PainSc: 0-No pain         Complications: No apparent anesthesia complications

## 2019-08-07 NOTE — Anesthesia Postprocedure Evaluation (Signed)
Anesthesia Post Note  Patient: Julia Green  Procedure(s) Performed: RIGHT CARPAL TUNNEL RELEASE (Right Hand)     Patient location during evaluation: PACU Anesthesia Type: MAC Level of consciousness: awake and alert Pain management: pain level controlled Vital Signs Assessment: post-procedure vital signs reviewed and stable Respiratory status: spontaneous breathing, nonlabored ventilation and respiratory function stable Cardiovascular status: stable and blood pressure returned to baseline Postop Assessment: no apparent nausea or vomiting Anesthetic complications: no    Last Vitals:  Vitals:   08/07/19 0915 08/07/19 0930  BP: 123/74 136/84  Pulse: 71 (!) 57  Resp: 16 16  Temp:    SpO2: 100% 100%    Last Pain:  Vitals:   08/07/19 0930  TempSrc:   PainSc: 0-No pain                 Aneri Slagel A.

## 2019-08-07 NOTE — Anesthesia Procedure Notes (Signed)
Anesthesia Regional Block: Bier block (IV Regional)   Pre-Anesthetic Checklist: ,, timeout performed, Correct Patient, Correct Site, Correct Laterality, Correct Procedure, Correct Position, site marked, Risks and benefits discussed,  Surgical consent,  Pre-op evaluation,  At surgeon's request and post-op pain management  Laterality: Right  Prep: alcohol swabs        Procedures:,,,,, intact distal pulses,,, #20gu IV placed  Narrative:  Anesthesiologist: Josephine Igo, MD CRNA: Verita Lamb, CRNA

## 2019-08-07 NOTE — Discharge Instructions (Signed)
Postoperative instructions:  Weightbearing instructions: no heavy lifting  Dressing instructions: Keep your dressing and/or splint clean and dry at all times.  It will be removed at your first post-operative appointment.  Your stitches and/or staples will be removed at this visit.  Incision instructions:  Do not soak your incision for 3 weeks after surgery.  If the incision gets wet, pat dry and do not scrub the incision.  Pain control:  You have been given a prescription to be taken as directed for post-operative pain control.  In addition, elevate the operative extremity above the heart at all times to prevent swelling and throbbing pain.  Take over-the-counter Colace, 153m by mouth twice a day while taking narcotic pain medications to help prevent constipation.  Follow up appointments: 1) 7 days for wound check. 2) Dr. XErlinda Hongas scheduled.   -------------------------------------------------------------------------------------------------------------  After Surgery Pain Control:  After your surgery, post-surgical discomfort or pain is likely. This discomfort can last several days to a few weeks. At certain times of the day your discomfort may be more intense.  Did you receive a nerve block?  A nerve block can provide pain relief for one hour to two days after your surgery. As long as the nerve block is working, you will experience little or no sensation in the area the surgeon operated on.  As the nerve block wears off, you will begin to experience pain or discomfort. It is very important that you begin taking your prescribed pain medication before the nerve block fully wears off. Treating your pain at the first sign of the block wearing off will ensure your pain is better controlled and more tolerable when full-sensation returns. Do not wait until the pain is intolerable, as the medicine will be less effective. It is better to treat pain in advance than to try and catch up.  General  Anesthesia:  If you did not receive a nerve block during your surgery, you will need to start taking your pain medication shortly after your surgery and should continue to do so as prescribed by your surgeon.  Pain Medication:  Most commonly we prescribe Vicodin and Percocet for post-operative pain. Both of these medications contain a combination of acetaminophen (Tylenol) and a narcotic to help control pain.   It takes between 30 and 45 minutes before pain medication starts to work. It is important to take your medication before your pain level gets too intense.   Nausea is a common side effect of many pain medications. You will want to eat something before taking your pain medicine to help prevent nausea.   If you are taking a prescription pain medication that contains acetaminophen, we recommend that you do not take additional over the counter acetaminophen (Tylenol).  Other pain relieving options:   Using a cold pack to ice the affected area a few times a day (15 to 20 minutes at a time) can help to relieve pain, reduce swelling and bruising.   Elevation of the affected area can also help to reduce pain and swelling.   Post Anesthesia Home Care Instructions  Activity: Get plenty of rest for the remainder of the day. A responsible individual must stay with you for 24 hours following the procedure.  For the next 24 hours, DO NOT: -Drive a car -OPaediatric nurse-Drink alcoholic beverages -Take any medication unless instructed by your physician -Make any legal decisions or sign important papers.  Meals: Start with liquid foods such as gelatin or soup. Progress to  regular foods as tolerated. Avoid greasy, spicy, heavy foods. If nausea and/or vomiting occur, drink only clear liquids until the nausea and/or vomiting subsides. Call your physician if vomiting continues.  Special Instructions/Symptoms: Your throat may feel dry or sore from the anesthesia or the breathing tube placed in  your throat during surgery. If this causes discomfort, gargle with warm salt water. The discomfort should disappear within 24 hours.  If you had a scopolamine patch placed behind your ear for the management of post- operative nausea and/or vomiting:  1. The medication in the patch is effective for 72 hours, after which it should be removed.  Wrap patch in a tissue and discard in the trash. Wash hands thoroughly with soap and water. 2. You may remove the patch earlier than 72 hours if you experience unpleasant side effects which may include dry mouth, dizziness or visual disturbances. 3. Avoid touching the patch. Wash your hands with soap and water after contact with the patch.

## 2019-08-07 NOTE — H&P (Signed)
PREOPERATIVE H&P  Chief Complaint: right carpal tunnel syndrome  HPI: Julia Green is a 48 y.o. female who presents for surgical treatment of right carpal tunnel syndrome.  She denies any changes in medical history.  Past Medical History:  Diagnosis Date  . Arthritis   . Asthma   . Depression   . Fatty liver   . Gallstones   . GERD (gastroesophageal reflux disease)   . Headache   . History of low potassium   . Hypertension   . Hypothyroidism   . Right arm numbness   . Right leg numbness   . Sciatic nerve pain   . Thyroid disease    Past Surgical History:  Procedure Laterality Date  . COLONOSCOPY    . LAPAROSCOPIC VAGINAL HYSTERECTOMY WITH SALPINGECTOMY Bilateral 06/26/2017   Procedure: LAPAROSCOPIC ASSISTED VAGINAL HYSTERECTOMY WITH SALPINGECTOMY;  Surgeon: Princess Bruins, MD;  Location: [facility];  Service: Gynecology;  Laterality: Bilateral;  request 7:30am OR time  requests 2 hours Rex the lighted retractor rep will be here   . UPPER GI ENDOSCOPY     Social History   Socioeconomic History  . Marital status: Divorced    Spouse name: Not on file  . Number of children: 3  . Years of education: Not on file  . Highest education level: Not on file  Occupational History  . Not on file  Tobacco Use  . Smoking status: Never Smoker  . Smokeless tobacco: Never Used  Substance and Sexual Activity  . Alcohol use: No  . Drug use: No  . Sexual activity: Yes    Birth control/protection: None  Other Topics Concern  . Not on file  Social History Narrative  . Not on file   Social Determinants of Health   Financial Resource Strain:   . Difficulty of Paying Living Expenses: Not on file  Food Insecurity:   . Worried About Charity fundraiser in the Last Year: Not on file  . Ran Out of Food in the Last Year: Not on file  Transportation Needs:   . Lack of Transportation (Medical): Not on file  . Lack of Transportation (Non-Medical): Not on file    Physical Activity:   . Days of Exercise per Week: Not on file  . Minutes of Exercise per Session: Not on file  Stress:   . Feeling of Stress : Not on file  Social Connections:   . Frequency of Communication with Friends and Family: Not on file  . Frequency of Social Gatherings with Friends and Family: Not on file  . Attends Religious Services: Not on file  . Active Member of Clubs or Organizations: Not on file  . Attends Archivist Meetings: Not on file  . Marital Status: Not on file   Family History  Problem Relation Age of Onset  . Hypertension Mother   . Uterine cancer Mother   . Cancer Sister        unknown  . Stomach cancer Maternal Aunt   . Colon cancer Neg Hx   . Esophageal cancer Neg Hx   . Rectal cancer Neg Hx    No Known Allergies Prior to Admission medications   Medication Sig Start Date End Date Taking? Authorizing Provider  albuterol (PROVENTIL HFA;VENTOLIN HFA) 108 (90 Base) MCG/ACT inhaler Inhale 1-2 puffs into the lungs every 6 (six) hours as needed for wheezing or shortness of breath.   Yes [provider]  calcium carbonate (OSCAL) 1500 (600  Ca) MG TABS tablet Take 1,500 mg by mouth daily.   Yes [provider]  fluticasone (FLONASE) 50 MCG/ACT nasal spray Place 1 spray into both nostrils 2 (two) times daily.   Yes [provider]  gabapentin (NEURONTIN) 600 MG tablet Take 600 mg by mouth 3 (three) times daily.    Yes [provider]  levothyroxine (SYNTHROID, LEVOTHROID) 137 MCG tablet Take 1 tablet (137 mcg total) by mouth daily before breakfast. 11/21/16  Yes Terrance Mass, MD  losartan-hydrochlorothiazide (HYZAAR) 50-12.5 MG tablet Take 1 tablet by mouth daily. 01/31/18  Yes [provider]  montelukast (SINGULAIR) 10 MG tablet Take 10 mg by mouth at bedtime.   Yes [provider]  Omega-3 Fatty Acids (FISH OIL) 1000 MG CAPS Take by mouth.   Yes [provider]  omeprazole (PRILOSEC)  40 MG capsule Take 1 capsule (40 mg total) by mouth daily. 04/05/17  Yes Danis, Kirke Corin, MD  vitamin C (ASCORBIC ACID) 500 MG tablet Take 500 mg by mouth daily.   Yes [provider]  aspirin EC 81 MG tablet Take 81 mg by mouth daily.    [provider]     Positive ROS: All other systems have been reviewed and were otherwise negative with the exception of those mentioned in the HPI and as above.  Physical Exam: General: Alert, no acute distress Cardiovascular: No pedal edema Respiratory: No cyanosis, no use of accessory musculature GI: abdomen soft Skin: No lesions in the area of chief complaint Neurologic: Sensation intact distally Psychiatric: Patient is competent for consent with normal mood and affect Lymphatic: no lymphedema  MUSCULOSKELETAL: exam stable  Assessment: right carpal tunnel syndrome  Plan: Plan for Procedure(s): RIGHT CARPAL TUNNEL RELEASE  The risks benefits and alternatives were discussed with the patient including but not limited to the risks of nonoperative treatment, versus surgical intervention including infection, bleeding, nerve injury,  blood clots, cardiopulmonary complications, morbidity, mortality, among others, and they were willing to proceed.   Preoperative templating of the joint replacement has been completed, documented, and submitted to the Operating Room personnel in order to optimize intra-operative equipment management.  Eduard Roux, MD   08/07/2019 8:19 AM

## 2019-08-08 ENCOUNTER — Encounter: Payer: Self-pay | Admitting: *Deleted

## 2019-08-14 ENCOUNTER — Other Ambulatory Visit: Payer: Self-pay

## 2019-08-14 ENCOUNTER — Encounter: Payer: Self-pay | Admitting: Orthopaedic Surgery

## 2019-08-14 ENCOUNTER — Ambulatory Visit (INDEPENDENT_AMBULATORY_CARE_PROVIDER_SITE_OTHER): Payer: 59 | Admitting: Physician Assistant

## 2019-08-14 DIAGNOSIS — Z9889 Other specified postprocedural states: Secondary | ICD-10-CM

## 2019-08-14 MED ORDER — HYDROCODONE-ACETAMINOPHEN 5-325 MG PO TABS: 1.0000 | ORAL_TABLET | Freq: Two times a day (BID) | ORAL | 0 refills | Status: DC | PRN

## 2019-08-14 NOTE — Progress Notes (Signed)
Post-Op Visit Note   Patient: Julia Green           Date of Birth: 04/30/1971           MRN: [id number] Visit Date: 08/14/2019 PCP: Cathleen Corti, PA-C   Assessment & Plan:  Chief Complaint:  Chief Complaint  Patient presents with  . Right Wrist - Follow-up   Visit Diagnoses:  1. S/P carpal tunnel release     Plan: Patient is a pleasant 48 year old Spanish-speaking female who comes in today with interpreter.  She is 1 week out right carpal tunnel release, date of surgery 08/07/2019.  She has been doing well.  She has had moderate pain.  She is taking Norco with moderate relief of symptoms.  No fevers or chills.  Examination of her right wrist reveals a well-healing surgical incision with nylon sutures in place.  No evidence of infection or cellulitis.  Today, we will recover the wound.  Will apply removable wrist splint.  She will follow-up with [place] in 1 week's time for suture removal.  No heavy lifting or submerging the wrist in water for another 3 weeks.  Call with concerns or questions in the meantime.  Norco refilled.  Follow-Up Instructions: Return in about 1 week (around 08/21/2019).   Orders:  No orders of the defined types were placed in this encounter.  Meds ordered this encounter  Medications  . HYDROcodone-acetaminophen (NORCO) 5-325 MG tablet    Sig: Take 1-2 tablets by mouth 2 (two) times daily as needed for moderate pain.    Dispense:  30 tablet    Refill:  0    Imaging: No new imaging  PMFS History: Patient Active Problem List   Diagnosis Date Noted  . Carpal tunnel syndrome on right 08/07/2019  . Right low back pain 07/17/2018  . Neck pain 07/17/2018  . Postoperative state 06/26/2017  . Rotator cuff syndrome of right shoulder 05/30/2017  . 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  . Abdominal bloating 03/22/2017  . Other specified hypothyroidism 11/16/2016  . Dysmenorrhea 07/20/2016  . [place]  06/16/2016  . Dyspareunia, female 06/16/2016   Past Medical History:  Diagnosis Date  . Arthritis   . Asthma   . Depression   . Fatty liver   . Gallstones   . GERD (gastroesophageal reflux disease)   . Headache   . History of low potassium   . Hypertension   . Hypothyroidism   . Right arm numbness   . Right leg numbness   . Sciatic nerve pain   . Thyroid disease     Family History  Problem Relation Age of Onset  . Hypertension Mother   . Uterine cancer Mother   . Cancer Sister        unknown  . Stomach cancer Maternal Aunt   . Colon cancer Neg Hx   . Esophageal cancer Neg Hx   . Rectal cancer Neg Hx     Past Surgical History:  Procedure Laterality Date  . CARPAL TUNNEL RELEASE Right 08/07/2019   Procedure: RIGHT CARPAL TUNNEL RELEASE;  Surgeon: Leandrew Koyanagi, MD;  Location: [facility];  Service: Orthopedics;  Laterality: Right;  . COLONOSCOPY    . LAPAROSCOPIC VAGINAL HYSTERECTOMY WITH SALPINGECTOMY Bilateral 06/26/2017   Procedure: LAPAROSCOPIC ASSISTED VAGINAL HYSTERECTOMY WITH SALPINGECTOMY;  Surgeon: Princess Bruins, MD;  Location: [facility];  Service: Gynecology;  Laterality: Bilateral;  request 7:30am OR time  requests 2 hours Rex  the lighted retractor rep will be here   . UPPER GI ENDOSCOPY     Social History   Occupational History  . Not on file  Tobacco Use  . Smoking status: Never Smoker  . Smokeless tobacco: Never Used  Substance and Sexual Activity  . Alcohol use: No  . Drug use: No  . Sexual activity: Yes    Birth control/protection: None

## 2019-08-21 ENCOUNTER — Ambulatory Visit (INDEPENDENT_AMBULATORY_CARE_PROVIDER_SITE_OTHER): Payer: 59 | Admitting: Physician Assistant

## 2019-08-21 ENCOUNTER — Encounter: Payer: Self-pay | Admitting: Physician Assistant

## 2019-08-21 ENCOUNTER — Other Ambulatory Visit: Payer: Self-pay

## 2019-08-21 DIAGNOSIS — Z9889 Other specified postprocedural states: Secondary | ICD-10-CM

## 2019-08-21 NOTE — Progress Notes (Signed)
Post-Op Visit Note   Patient: Julia Green           Date of Birth: [birth year]           MRN: [id number] Visit Date: 08/21/2019 PCP: Cathleen Corti, PA-C   Assessment & Plan:  Chief Complaint:  Chief Complaint  Patient presents with  . Right Wrist - Pain, Routine Post Op   Visit Diagnoses:  1. S/P carpal tunnel release     Plan: Patient is a pleasant 48 year old female who comes in today 2 weeks status post right carpal tunnel release, date of surgery 08/07/2019.  She has been doing well.  She has been taking Norco and using her brace as needed.  No fevers or chills.  Examination of her right wrist reveals a well-healing surgical incision with nylon sutures in place.  No evidence of infection or cellulitis.  Today, nylon sutures were removed and Steri-Strips applied.  She will work on range of motion exercises.  Wear the brace as needed.  No heavy lifting or submerging her hand in water for another 2 weeks.  Follow-up with [place] in 4 weeks time for recheck.  Out of work note provided for another 4 weeks as she has a fairly physical job.  Follow-Up Instructions: Return in about 4 weeks (around 09/18/2019).   Orders:  No orders of the defined types were placed in this encounter.  No orders of the defined types were placed in this encounter.   Imaging: No new imaging  PMFS History: Patient Active Problem List   Diagnosis Date Noted  . Carpal tunnel syndrome on right 08/07/2019  . Right low back pain 07/17/2018  . Neck pain 07/17/2018  . Postoperative state 06/26/2017  . Rotator cuff syndrome of right shoulder 05/30/2017  . 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  . Abdominal bloating 03/22/2017  . Other specified hypothyroidism 11/16/2016  . Dysmenorrhea 07/20/2016  . 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  . Dyspareunia, female 06/16/2016   Past Medical History:  Diagnosis Date  . Arthritis   . Asthma   . Depression   . Fatty liver    . Gallstones   . GERD (gastroesophageal reflux disease)   . Headache   . History of low potassium   . Hypertension   . Hypothyroidism   . Right arm numbness   . Right leg numbness   . Sciatic nerve pain   . Thyroid disease     Family History  Problem Relation Age of Onset  . Hypertension Mother   . Uterine cancer Mother   . Cancer Sister        unknown  . Stomach cancer Maternal Aunt   . Colon cancer Neg Hx   . Esophageal cancer Neg Hx   . Rectal cancer Neg Hx     Past Surgical History:  Procedure Laterality Date  . CARPAL TUNNEL RELEASE Right 08/07/2019   Procedure: RIGHT CARPAL TUNNEL RELEASE;  Surgeon: Leandrew Koyanagi, MD;  Location: [facility];  Service: Orthopedics;  Laterality: Right;  . COLONOSCOPY    . LAPAROSCOPIC VAGINAL HYSTERECTOMY WITH SALPINGECTOMY Bilateral 06/26/2017   Procedure: LAPAROSCOPIC ASSISTED VAGINAL HYSTERECTOMY WITH SALPINGECTOMY;  Surgeon: Princess Bruins, MD;  Location: [facility];  Service: Gynecology;  Laterality: Bilateral;  request 7:30am OR time  requests 2 hours Rex the lighted retractor rep will be here   . UPPER GI ENDOSCOPY     Social History   Occupational History  . Not on  file  Tobacco Use  . Smoking status: Never Smoker  . Smokeless tobacco: Never Used  Substance and Sexual Activity  . Alcohol use: No  . Drug use: No  . Sexual activity: Yes    Birth control/protection: None

## 2019-09-16 ENCOUNTER — Other Ambulatory Visit: Payer: Self-pay

## 2019-09-17 ENCOUNTER — Ambulatory Visit (INDEPENDENT_AMBULATORY_CARE_PROVIDER_SITE_OTHER): Payer: 59 | Admitting: Physician Assistant

## 2019-09-17 ENCOUNTER — Ambulatory Visit (INDEPENDENT_AMBULATORY_CARE_PROVIDER_SITE_OTHER): Payer: 59 | Admitting: Obstetrics & Gynecology

## 2019-09-17 ENCOUNTER — Encounter: Payer: Self-pay | Admitting: Obstetrics & Gynecology

## 2019-09-17 VITALS — BP 122/80 | Ht 63.0 in | Wt 155.0 lb

## 2019-09-17 DIAGNOSIS — Z1272 Encounter for screening for malignant neoplasm of vagina: Secondary | ICD-10-CM

## 2019-09-17 DIAGNOSIS — Z90710 Acquired absence of both cervix and uterus: Secondary | ICD-10-CM

## 2019-09-17 DIAGNOSIS — Z780 Asymptomatic menopausal state: Secondary | ICD-10-CM | POA: Diagnosis not present

## 2019-09-17 DIAGNOSIS — Z9889 Other specified postprocedural states: Secondary | ICD-10-CM

## 2019-09-17 DIAGNOSIS — Z01419 Encounter for gynecological examination (general) (routine) without abnormal findings: Secondary | ICD-10-CM | POA: Diagnosis not present

## 2019-09-17 DIAGNOSIS — R300 Dysuria: Secondary | ICD-10-CM | POA: Diagnosis not present

## 2019-09-17 LAB — URINALYSIS, COMPLETE W/RFL CULTURE
Bacteria, UA: NONE SEEN /HPF
Bilirubin Urine: NEGATIVE
Glucose, UA: NEGATIVE
Hgb urine dipstick: NEGATIVE
Hyaline Cast: NONE SEEN /LPF
Ketones, ur: NEGATIVE
Leukocyte Esterase: NEGATIVE
Nitrites, Initial: NEGATIVE
Protein, ur: NEGATIVE
RBC / HPF: NONE SEEN /HPF (ref 0–2)
Specific Gravity, Urine: 1.02 (ref 1.001–1.03)
WBC, UA: NONE SEEN /HPF (ref 0–5)
pH: 7 (ref 5.0–8.0)

## 2019-09-17 LAB — NO CULTURE INDICATED

## 2019-09-17 NOTE — Progress Notes (Signed)
Post-Op Visit Note   Patient: Julia Green           Date of Birth: 21-May-1971           MRN: [id number] Visit Date: 09/17/2019 PCP: Cathleen Corti, PA-C   Assessment & Plan:  Chief Complaint:  Chief Complaint  Patient presents with  . Right Hand - Routine Post Op   Visit Diagnoses:  1. S/P carpal tunnel release     Plan: Patient is a pleasant 49 year old Spanish-speaking female who comes in today with an interpreter.  She is 6 weeks out right carpal tunnel release 08/07/2019.  She has been doing fairly well.  She does have some hypersensitivity to the incision.  She has been massaging this with cocoa butter.  She also notes occasional numbness to the fingers in the morning.  Otherwise, doing well.  Examination of her right hand reveals a fully healed surgical scar without complication.  She has full sensation distally.  Fingers are warm and well-perfused.  At this point, she will continue to advance with activity as tolerated.  Work note provided today no lifting more than 10 to 15 pounds for 2 weeks and then can return to full duty without restrictions.  Follow-up with [place] as needed.  Follow-Up Instructions: Return if symptoms worsen or fail to improve.   Orders:  No orders of the defined types were placed in this encounter.  No orders of the defined types were placed in this encounter.   Imaging: No new imaging  PMFS History: Patient Active Problem List   Diagnosis Date Noted  . Carpal tunnel syndrome on right 08/07/2019  . Right low back pain 07/17/2018  . Neck pain 07/17/2018  . Postoperative state 06/26/2017  . Rotator cuff syndrome of right shoulder 05/30/2017  . 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  . Abdominal bloating 03/22/2017  . Other specified hypothyroidism 11/16/2016  . Dysmenorrhea 07/20/2016  . 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  . Dyspareunia, female 06/16/2016   Past Medical History:  Diagnosis Date  .  Arthritis   . Asthma   . Depression   . Fatty liver   . Gallstones   . GERD (gastroesophageal reflux disease)   . Headache   . History of low potassium   . Hypertension   . Hypothyroidism   . Right arm numbness   . Right leg numbness   . Sciatic nerve pain   . Thyroid disease     Family History  Problem Relation Age of Onset  . Hypertension Mother   . Uterine cancer Mother   . Cancer Sister        unknown  . Stomach cancer Maternal Aunt   . Colon cancer Neg Hx   . Esophageal cancer Neg Hx   . Rectal cancer Neg Hx     Past Surgical History:  Procedure Laterality Date  . CARPAL TUNNEL RELEASE Right 08/07/2019   Procedure: RIGHT CARPAL TUNNEL RELEASE;  Surgeon: Leandrew Koyanagi, MD;  Location: [facility];  Service: Orthopedics;  Laterality: Right;  . COLONOSCOPY    . LAPAROSCOPIC VAGINAL HYSTERECTOMY WITH SALPINGECTOMY Bilateral 06/26/2017   Procedure: LAPAROSCOPIC ASSISTED VAGINAL HYSTERECTOMY WITH SALPINGECTOMY;  Surgeon: Princess Bruins, MD;  Location: [facility];  Service: Gynecology;  Laterality: Bilateral;  request 7:30am OR time  requests 2 hours Rex the lighted retractor rep will be here   . UPPER GI ENDOSCOPY     Social History   Occupational History  .  Not on file  Tobacco Use  . Smoking status: Never Smoker  . Smokeless tobacco: Never Used  Substance and Sexual Activity  . Alcohol use: No  . Drug use: No  . Sexual activity: Yes    Birth control/protection: None

## 2019-09-17 NOTE — Progress Notes (Signed)
Julia Green [birth year] [id number]   History:    49 y.o. G4P4L4 Married  RP:  Established patient presenting for annual gyn exam   HPI: S/P LAVH.  Postmenopause on no HRT.  C/O chronic burning when passing urine.  No pelvic pain.  No pain with intercourse.  Breasts normal.  Body mass index 27.46.  Not active physically on the regular basis.  Health labs with family physician.  Past medical history,surgical history, family history and social history were all reviewed and documented in the EPIC chart.  Gynecologic History Patient's last menstrual period was 03/24/2017.  Obstetric History OB History  Gravida Para Term Preterm AB Living  4 4       4   SAB TAB Ectopic Multiple Live Births               # Outcome Date GA Lbr Len/2nd Weight Sex Delivery Anes PTL Lv  4 Para           3 Para           2 Para           1 Para              ROS: A ROS was performed and pertinent positives and negatives are included in the history.  GENERAL: No fevers or chills. HEENT: No change in vision, no earache, sore throat or sinus congestion. NECK: No pain or stiffness. CARDIOVASCULAR: No chest pain or pressure. No palpitations. PULMONARY: No shortness of breath, cough or wheeze. GASTROINTESTINAL: No abdominal pain, nausea, vomiting or diarrhea, melena or bright red blood per rectum. GENITOURINARY: No urinary frequency, urgency, hesitancy or dysuria. MUSCULOSKELETAL: No joint or muscle pain, no back pain, no recent trauma. DERMATOLOGIC: No rash, no itching, no lesions. ENDOCRINE: No polyuria, polydipsia, no heat or cold intolerance. No recent change in weight. HEMATOLOGICAL: No anemia or easy bruising or bleeding. NEUROLOGIC: No headache, seizures, numbness, tingling or weakness. PSYCHIATRIC: No depression, no loss of interest in normal activity or change in sleep pattern.     Exam:   BP 122/80 (BP Location: Right Arm, Patient Position: Sitting, Cuff Size: Normal)   Ht 5' 3"  (1.6 m)    Wt 155 lb (70.3 kg)   LMP 03/24/2017   BMI 27.46 kg/m   Body mass index is 27.46 kg/m.  General appearance : Well developed well nourished female. No acute distress HEENT: Eyes: no retinal hemorrhage or exudates,  Neck supple, trachea midline, no carotid bruits, no thyroidmegaly Lungs: Clear to auscultation, no rhonchi or wheezes, or rib retractions  Heart: Regular rate and rhythm, no murmurs or gallops Breast:Examined in sitting and supine position were symmetrical in appearance, no palpable masses or tenderness,  no skin retraction, no nipple inversion, no nipple discharge, no skin discoloration, no axillary or supraclavicular lymphadenopathy Abdomen: no palpable masses or tenderness, no rebound or guarding Extremities: no edema or skin discoloration or tenderness  Pelvic: Vulva: Normal             Vagina: No gross lesions or discharge.  Pap reflex done.  Cervix/Uterus absent  Adnexa  Without masses or tenderness  Anus: Normal  U/A: Completely negative   Assessment/Plan:  49 y.o. female for annual exam   1. Encounter for Papanicolaou smear of vagina as part of routine gynecological examination Gynecologic exam status post LAVH in menopause.  Pap reflex done on the vaginal vault.  Breast exam normal.  Will schedule a screening mammogram at the  breast center.  Health labs with family physician.  2. S/P laparoscopic assisted vaginal hysterectomy (LAVH)  3. Postmenopause Well on no hormone replacement therapy.  Vitamin D supplements, calcium intake of 1200 mg daily and regular weightbearing physical activity is recommended.  4. Dysuria Urine analysis completely negative.  Patient reassured. - Urinalysis,Complete w/RFL Culture  Princess Bruins MD, 2:17 PM 09/17/2019

## 2019-09-18 ENCOUNTER — Ambulatory Visit: Payer: 59 | Admitting: Obstetrics & Gynecology

## 2019-09-18 LAB — PAP IG W/ RFLX HPV ASCU

## 2019-09-20 ENCOUNTER — Encounter: Payer: Self-pay | Admitting: Obstetrics & Gynecology

## 2019-09-20 NOTE — Patient Instructions (Signed)
1. Encounter for Papanicolaou smear of vagina as part of routine gynecological examination Gynecologic exam status post LAVH in menopause.  Pap reflex done on the vaginal vault.  Breast exam normal.  Will schedule a screening mammogram at the breast center.  Health labs with family physician.  2. S/P laparoscopic assisted vaginal hysterectomy (LAVH)  3. Postmenopause Well on no hormone replacement therapy.  Vitamin D supplements, calcium intake of 1200 mg daily and regular weightbearing physical activity is recommended.  4. Dysuria Urine analysis completely negative.  Patient reassured. - Urinalysis,Complete w/RFL Culture  Kienna, fue un placer conocerle hoy!  Voy a informarle de sus [employer].

## 2019-10-15 ENCOUNTER — Other Ambulatory Visit: Payer: Self-pay

## 2019-12-10 ENCOUNTER — Telehealth: Payer: Self-pay | Admitting: *Deleted

## 2019-12-10 NOTE — Telephone Encounter (Signed)
Patient called requesting to start back on estradiol 1 mg tablet, declined Rx at visit on 09/19/19, but now would like to start back on Rx per [place]. Okay to send Rx?

## 2019-12-11 MED ORDER — ESTRADIOL 1 MG PO TABS: 1.0000 mg | ORAL_TABLET | Freq: Every day | ORAL | 3 refills | Status: DC

## 2019-12-11 NOTE — Telephone Encounter (Signed)
Rx sent 

## 2019-12-11 NOTE — Telephone Encounter (Signed)
Agree with Estradiol 1 mg PO daily.  #90, refill x 4.  Note s/p LAVH.

## 2020-11-13 ENCOUNTER — Encounter: Payer: Self-pay | Admitting: Obstetrics & Gynecology

## 2020-11-13 ENCOUNTER — Ambulatory Visit (INDEPENDENT_AMBULATORY_CARE_PROVIDER_SITE_OTHER): Payer: 59 | Admitting: Obstetrics & Gynecology

## 2020-11-13 ENCOUNTER — Other Ambulatory Visit: Payer: Self-pay

## 2020-11-13 VITALS — BP 138/84 | Ht 62.5 in | Wt 150.0 lb

## 2020-11-13 DIAGNOSIS — Z01419 Encounter for gynecological examination (general) (routine) without abnormal findings: Secondary | ICD-10-CM

## 2020-11-13 DIAGNOSIS — Z90710 Acquired absence of both cervix and uterus: Secondary | ICD-10-CM

## 2020-11-13 DIAGNOSIS — R350 Frequency of micturition: Secondary | ICD-10-CM | POA: Diagnosis not present

## 2020-11-13 DIAGNOSIS — Z780 Asymptomatic menopausal state: Secondary | ICD-10-CM | POA: Diagnosis not present

## 2020-11-13 LAB — URINALYSIS, COMPLETE W/RFL CULTURE
Bacteria, UA: NONE SEEN /HPF
Bilirubin Urine: NEGATIVE
Glucose, UA: NEGATIVE
Hgb urine dipstick: NEGATIVE
Hyaline Cast: NONE SEEN /LPF
Ketones, ur: NEGATIVE
Leukocyte Esterase: NEGATIVE
Nitrites, Initial: NEGATIVE
Protein, ur: NEGATIVE
RBC / HPF: NONE SEEN /HPF (ref 0–2)
Specific Gravity, Urine: 1.015 (ref 1.001–1.03)
WBC, UA: NONE SEEN /HPF (ref 0–5)
pH: 6 (ref 5.0–8.0)

## 2020-11-13 LAB — NO CULTURE INDICATED

## 2020-11-13 NOTE — Progress Notes (Signed)
Julia Green [birth year] [id number]   History:    50 y.o. G4P4L4 Married  RP:  Established patient presenting for annual gyn exam   HPI: S/P LAVH.  Postmenopause on no HRT.  C/O chronic burning when passing urine.  No pelvic pain.  No pain with intercourse.  Breasts normal.  Body mass index 27.46.  Not active physically on the regular basis.  Health labs with family physician.   Past medical history,surgical history, family history and social history were all reviewed and documented in the EPIC chart.  Gynecologic History Patient's last menstrual period was 03/24/2017.              Obstetric History OB History  Gravida Para Term Preterm AB Living  4 4       4   SAB IAB Ectopic Multiple Live Births               # Outcome Date GA Lbr Len/2nd Weight Sex Delivery Anes PTL Lv  4 Para           3 Para           2 Para           1 Para              ROS: A ROS was performed and pertinent positives and negatives are included in the history.  GENERAL: No fevers or chills. HEENT: No change in vision, no earache, sore throat or sinus congestion. NECK: No pain or stiffness. CARDIOVASCULAR: No chest pain or pressure. No palpitations. PULMONARY: No shortness of breath, cough or wheeze. GASTROINTESTINAL: No abdominal pain, nausea, vomiting or diarrhea, melena or bright red blood per rectum. GENITOURINARY: No urinary frequency, urgency, hesitancy or dysuria. MUSCULOSKELETAL: No joint or muscle pain, no back pain, no recent trauma. DERMATOLOGIC: No rash, no itching, no lesions. ENDOCRINE: No polyuria, polydipsia, no heat or cold intolerance. No recent change in weight. HEMATOLOGICAL: No anemia or easy bruising or bleeding. NEUROLOGIC: No headache, seizures, numbness, tingling or weakness. PSYCHIATRIC: No depression, no loss of interest in normal activity or change in sleep pattern.     Exam:   BP 138/84   Ht 5' 2.5" (1.588 m)   Wt 150 lb (68 kg)   LMP 03/24/2017   BMI 27.00  kg/m   Body mass index is 27 kg/m.  General appearance : Well developed well nourished female. No acute distress HEENT: Eyes: no retinal hemorrhage or exudates,  Neck supple, trachea midline, no carotid bruits, no thyroidmegaly Lungs: Clear to auscultation, no rhonchi or wheezes, or rib retractions  Heart: Regular rate and rhythm, no murmurs or gallops Breast:Examined in sitting and supine position were symmetrical in appearance, no palpable masses or tenderness,  no skin retraction, no nipple inversion, no nipple discharge, no skin discoloration, no axillary or supraclavicular lymphadenopathy Abdomen: no palpable masses or tenderness, no rebound or guarding Extremities: no edema or skin discoloration or tenderness  Pelvic: Vulva: Normal             Vagina: No gross lesions or discharge  Cervix/Uterus absent  Adnexa  Without masses or tenderness  Anus: Normal  U/A: Completely negative.   Assessment/Plan:  50 y.o. female for annual exam   1. Well female exam with routine gynecological exam Gynecologic exam status post total hysterectomy and menopause.  No indication for Pap test this year.  Breast exam normal.  Screening mammogram in June 2021 was negative.  Colonoscopy 2018.  Health  labs with family PA.  2. S/P laparoscopic assisted vaginal hysterectomy (LAVH)  3. Postmenopause Well on no hormone replacement therapy.  4. Frequency of urination Urine analysis completely negative, patient reassured. - Urinalysis,Complete w/RFL Culture  Other orders - losartan (COZAAR) 50 MG tablet; Take 50 mg by mouth daily. - losartan (COZAAR) 25 MG tablet; Take 25 mg by mouth daily. - hydrochlorothiazide (HYDRODIURIL) 12.5 MG tablet; Take 12.5 mg by mouth daily. - REFLEXIVE URINE CULTURE  Princess Bruins MD, 3:44 PM 11/13/2020

## 2020-11-18 ENCOUNTER — Encounter: Payer: Self-pay | Admitting: Obstetrics & Gynecology

## 2020-11-25 ENCOUNTER — Inpatient Hospital Stay (HOSPITAL_COMMUNITY)
Admission: EM | Admit: 2020-11-25 | Discharge: 2020-12-01 | DRG: 329 | Disposition: A | Discharge status: Home / Self Care | Payer: 59 | Attending: Surgery | Admitting: Surgery

## 2020-11-25 ENCOUNTER — Other Ambulatory Visit: Payer: Self-pay

## 2020-11-25 DIAGNOSIS — Z7982 Long term (current) use of aspirin: Secondary | ICD-10-CM

## 2020-11-25 DIAGNOSIS — K760 Fatty (change of) liver, not elsewhere classified: Secondary | ICD-10-CM | POA: Diagnosis present

## 2020-11-25 DIAGNOSIS — Z8049 Family history of malignant neoplasm of other genital organs: Secondary | ICD-10-CM

## 2020-11-25 DIAGNOSIS — Z8249 Family history of ischemic heart disease and other diseases of the circulatory system: Secondary | ICD-10-CM

## 2020-11-25 DIAGNOSIS — Z79899 Other long term (current) drug therapy: Secondary | ICD-10-CM

## 2020-11-25 DIAGNOSIS — Z90710 Acquired absence of both cervix and uterus: Secondary | ICD-10-CM

## 2020-11-25 DIAGNOSIS — E876 Hypokalemia: Secondary | ICD-10-CM | POA: Diagnosis not present

## 2020-11-25 DIAGNOSIS — Z90722 Acquired absence of ovaries, bilateral: Secondary | ICD-10-CM

## 2020-11-25 DIAGNOSIS — I10 Essential (primary) hypertension: Secondary | ICD-10-CM | POA: Diagnosis present

## 2020-11-25 DIAGNOSIS — K3533 Acute appendicitis with perforation and localized peritonitis, with abscess: Secondary | ICD-10-CM | POA: Diagnosis not present

## 2020-11-25 DIAGNOSIS — U071 COVID-19: Secondary | ICD-10-CM | POA: Diagnosis present

## 2020-11-25 DIAGNOSIS — C189 Malignant neoplasm of colon, unspecified: Secondary | ICD-10-CM | POA: Diagnosis present

## 2020-11-25 DIAGNOSIS — Z79890 Hormone replacement therapy (postmenopausal): Secondary | ICD-10-CM

## 2020-11-25 DIAGNOSIS — K5010 Crohn's disease of large intestine without complications: Secondary | ICD-10-CM | POA: Diagnosis present

## 2020-11-25 DIAGNOSIS — K219 Gastro-esophageal reflux disease without esophagitis: Secondary | ICD-10-CM | POA: Diagnosis present

## 2020-11-25 DIAGNOSIS — K3580 Unspecified acute appendicitis: Secondary | ICD-10-CM | POA: Diagnosis present

## 2020-11-25 DIAGNOSIS — J45909 Unspecified asthma, uncomplicated: Secondary | ICD-10-CM | POA: Diagnosis present

## 2020-11-25 DIAGNOSIS — Z6827 Body mass index (BMI) 27.0-27.9, adult: Secondary | ICD-10-CM

## 2020-11-25 DIAGNOSIS — E039 Hypothyroidism, unspecified: Secondary | ICD-10-CM | POA: Diagnosis present

## 2020-11-25 DIAGNOSIS — M1990 Unspecified osteoarthritis, unspecified site: Secondary | ICD-10-CM | POA: Diagnosis present

## 2020-11-25 DIAGNOSIS — E669 Obesity, unspecified: Secondary | ICD-10-CM | POA: Diagnosis present

## 2020-11-25 DIAGNOSIS — K567 Ileus, unspecified: Secondary | ICD-10-CM | POA: Diagnosis not present

## 2020-11-25 DIAGNOSIS — Z841 Family history of disorders of kidney and ureter: Secondary | ICD-10-CM

## 2020-11-25 DIAGNOSIS — R599 Enlarged lymph nodes, unspecified: Secondary | ICD-10-CM | POA: Diagnosis present

## 2020-11-25 DIAGNOSIS — F32A Depression, unspecified: Secondary | ICD-10-CM | POA: Diagnosis present

## 2020-11-25 DIAGNOSIS — Z800 Family history of malignant neoplasm of digestive organs: Secondary | ICD-10-CM

## 2020-11-25 LAB — CBC WITH DIFFERENTIAL/PLATELET
Abs Immature Granulocytes: 0.05 10*3/uL (ref 0.00–0.07)
Basophils Absolute: 0 10*3/uL (ref 0.0–0.1)
Basophils Relative: 0 %
Eosinophils Absolute: 0.1 10*3/uL (ref 0.0–0.5)
Eosinophils Relative: 1 %
HCT: 35.2 % — ABNORMAL LOW (ref 36.0–46.0)
Hemoglobin: 12.3 g/dL (ref 12.0–15.0)
Immature Granulocytes: 1 %
Lymphocytes Relative: 25 %
Lymphs Abs: 2.4 10*3/uL (ref 0.7–4.0)
MCH: 31.1 pg (ref 26.0–34.0)
MCHC: 34.9 g/dL (ref 30.0–36.0)
MCV: 88.9 fL (ref 80.0–100.0)
Monocytes Absolute: 0.8 10*3/uL (ref 0.1–1.0)
Monocytes Relative: 8 %
Neutro Abs: 6.4 10*3/uL (ref 1.7–7.7)
Neutrophils Relative %: 65 %
Platelets: 324 10*3/uL (ref 150–400)
RBC: 3.96 MIL/uL (ref 3.87–5.11)
RDW: 12.8 % (ref 11.5–15.5)
WBC: 9.9 10*3/uL (ref 4.0–10.5)
nRBC: 0 % (ref 0.0–0.2)

## 2020-11-25 LAB — COMPREHENSIVE METABOLIC PANEL
ALT: 24 U/L (ref 0–44)
AST: 23 U/L (ref 15–41)
Albumin: 3.9 g/dL (ref 3.5–5.0)
Alkaline Phosphatase: 65 U/L (ref 38–126)
Anion gap: 6 (ref 5–15)
BUN: 7 mg/dL (ref 6–20)
CO2: 29 mmol/L (ref 22–32)
Calcium: 9.4 mg/dL (ref 8.9–10.3)
Chloride: 103 mmol/L (ref 98–111)
Creatinine, Ser: 0.61 mg/dL (ref 0.44–1.00)
GFR, Estimated: >60 mL/min (ref >60)
Glucose, Bld: 100 mg/dL — ABNORMAL HIGH (ref 70–99)
Potassium: 3.1 mmol/L — ABNORMAL LOW (ref 3.5–5.1)
Sodium: 138 mmol/L (ref 135–145)
Total Bilirubin: 0.4 mg/dL (ref 0.3–1.2)
Total Protein: 7.7 g/dL (ref 6.5–8.1)

## 2020-11-25 LAB — I-STAT BETA HCG BLOOD, ED (MC, WL, AP ONLY): I-stat hCG, quantitative: <5 m[IU]/mL (ref <5)

## 2020-11-25 LAB — URINALYSIS, ROUTINE W REFLEX MICROSCOPIC
Bilirubin Urine: NEGATIVE
Glucose, UA: NEGATIVE mg/dL
Hgb urine dipstick: NEGATIVE
Ketones, ur: NEGATIVE mg/dL
Leukocytes,Ua: NEGATIVE
Nitrite: NEGATIVE
Protein, ur: NEGATIVE mg/dL
Specific Gravity, Urine: 1.018 (ref 1.005–1.030)
pH: 6 (ref 5.0–8.0)

## 2020-11-25 LAB — LIPASE, BLOOD: Lipase: 37 U/L (ref 11–51)

## 2020-11-25 NOTE — ED Triage Notes (Signed)
Pt was sent here from dr bc of CT scan of possible APPENDICITIS. Pt is having abdominal pain and nausea.

## 2020-11-25 NOTE — ED Provider Notes (Signed)
Patient placed in Quick Look pathway, seen and evaluated   Chief Complaint: appendicitis  HPI:   Pt had a ct scan today that shows appendicitis  ROS: right lower abdominal pain  Physical Exam:   Gen: No distress  Neuro: Awake and Alert  Skin: Warm    Focused Exam: tender right lower abdomen   Initiation of care has begun. The patient has been counseled on the process, plan, and necessity for staying for the completion/evaluation, and the remainder of the medical screening examination   Sidney Ace 11/25/20 1857    Blanchie Dessert, MD 11/25/20 2304

## 2020-11-26 ENCOUNTER — Inpatient Hospital Stay (HOSPITAL_COMMUNITY): Payer: 59 | Admitting: Anesthesiology

## 2020-11-26 ENCOUNTER — Encounter (HOSPITAL_COMMUNITY): Payer: Self-pay | Admitting: Student

## 2020-11-26 ENCOUNTER — Encounter (HOSPITAL_COMMUNITY): Admission: EM | Disposition: A | Discharge status: Home / Self Care | Payer: Self-pay | Source: Home / Self Care

## 2020-11-26 ENCOUNTER — Inpatient Hospital Stay: Payer: Self-pay

## 2020-11-26 ENCOUNTER — Other Ambulatory Visit: Payer: Self-pay | Admitting: Obstetrics & Gynecology

## 2020-11-26 DIAGNOSIS — E876 Hypokalemia: Secondary | ICD-10-CM | POA: Diagnosis not present

## 2020-11-26 DIAGNOSIS — I10 Essential (primary) hypertension: Secondary | ICD-10-CM | POA: Diagnosis present

## 2020-11-26 DIAGNOSIS — Z90710 Acquired absence of both cervix and uterus: Secondary | ICD-10-CM | POA: Diagnosis not present

## 2020-11-26 DIAGNOSIS — E669 Obesity, unspecified: Secondary | ICD-10-CM | POA: Diagnosis present

## 2020-11-26 DIAGNOSIS — Z79899 Other long term (current) drug therapy: Secondary | ICD-10-CM | POA: Diagnosis not present

## 2020-11-26 DIAGNOSIS — Z79890 Hormone replacement therapy (postmenopausal): Secondary | ICD-10-CM | POA: Diagnosis not present

## 2020-11-26 DIAGNOSIS — Z8049 Family history of malignant neoplasm of other genital organs: Secondary | ICD-10-CM | POA: Diagnosis not present

## 2020-11-26 DIAGNOSIS — K3533 Acute appendicitis with perforation and localized peritonitis, with abscess: Secondary | ICD-10-CM | POA: Diagnosis present

## 2020-11-26 DIAGNOSIS — U071 COVID-19: Secondary | ICD-10-CM | POA: Diagnosis present

## 2020-11-26 DIAGNOSIS — K219 Gastro-esophageal reflux disease without esophagitis: Secondary | ICD-10-CM | POA: Diagnosis present

## 2020-11-26 DIAGNOSIS — K3580 Unspecified acute appendicitis: Secondary | ICD-10-CM | POA: Diagnosis present

## 2020-11-26 DIAGNOSIS — C189 Malignant neoplasm of colon, unspecified: Secondary | ICD-10-CM | POA: Diagnosis present

## 2020-11-26 DIAGNOSIS — J45909 Unspecified asthma, uncomplicated: Secondary | ICD-10-CM | POA: Diagnosis present

## 2020-11-26 DIAGNOSIS — Z8249 Family history of ischemic heart disease and other diseases of the circulatory system: Secondary | ICD-10-CM | POA: Diagnosis not present

## 2020-11-26 DIAGNOSIS — Z90722 Acquired absence of ovaries, bilateral: Secondary | ICD-10-CM | POA: Diagnosis not present

## 2020-11-26 DIAGNOSIS — Z841 Family history of disorders of kidney and ureter: Secondary | ICD-10-CM | POA: Diagnosis not present

## 2020-11-26 DIAGNOSIS — E039 Hypothyroidism, unspecified: Secondary | ICD-10-CM | POA: Diagnosis present

## 2020-11-26 DIAGNOSIS — Z6827 Body mass index (BMI) 27.0-27.9, adult: Secondary | ICD-10-CM | POA: Diagnosis not present

## 2020-11-26 DIAGNOSIS — K567 Ileus, unspecified: Secondary | ICD-10-CM | POA: Diagnosis not present

## 2020-11-26 DIAGNOSIS — F32A Depression, unspecified: Secondary | ICD-10-CM | POA: Diagnosis present

## 2020-11-26 DIAGNOSIS — K5010 Crohn's disease of large intestine without complications: Secondary | ICD-10-CM | POA: Diagnosis present

## 2020-11-26 DIAGNOSIS — K760 Fatty (change of) liver, not elsewhere classified: Secondary | ICD-10-CM | POA: Diagnosis present

## 2020-11-26 DIAGNOSIS — M1990 Unspecified osteoarthritis, unspecified site: Secondary | ICD-10-CM | POA: Diagnosis present

## 2020-11-26 DIAGNOSIS — Z800 Family history of malignant neoplasm of digestive organs: Secondary | ICD-10-CM | POA: Diagnosis not present

## 2020-11-26 DIAGNOSIS — Z7982 Long term (current) use of aspirin: Secondary | ICD-10-CM | POA: Diagnosis not present

## 2020-11-26 DIAGNOSIS — R599 Enlarged lymph nodes, unspecified: Secondary | ICD-10-CM | POA: Diagnosis present

## 2020-11-26 HISTORY — PX: LAPAROSCOPIC RIGHT HEMI COLECTOMY: SHX5926

## 2020-11-26 LAB — HIV ANTIBODY (ROUTINE TESTING W REFLEX): HIV Screen 4th Generation wRfx: NONREACTIVE

## 2020-11-26 LAB — RESP PANEL BY RT-PCR (FLU A&B, COVID) ARPGX2
Influenza A by PCR: NEGATIVE
Influenza B by PCR: NEGATIVE
SARS Coronavirus 2 by RT PCR: POSITIVE — AB

## 2020-11-26 LAB — TYPE AND SCREEN
ABO/RH(D): B POS
Antibody Screen: NEGATIVE

## 2020-11-26 SURGERY — LAPAROSCOPIC RIGHT HEMI COLECTOMY
Anesthesia: General

## 2020-11-26 MED ORDER — FENTANYL CITRATE (PF) 100 MCG/2ML IJ SOLN
25.0000 ug | INTRAMUSCULAR | Status: DC | PRN
  Administered 2020-11-26: 25 ug via INTRAVENOUS

## 2020-11-26 MED ORDER — METRONIDAZOLE IN NACL 5-0.79 MG/ML-% IV SOLN
500.0000 mg | INTRAVENOUS | Status: DC
  Filled 2020-11-26: qty 100

## 2020-11-26 MED ORDER — LIDOCAINE HCL 1 % IJ SOLN
INTRAMUSCULAR | Status: AC
  Filled 2020-11-26: qty 20

## 2020-11-26 MED ORDER — ACETAMINOPHEN 160 MG/5ML PO SOLN: 325.0000 mg | ORAL | Status: DC | PRN

## 2020-11-26 MED ORDER — OXYCODONE HCL 5 MG/5ML PO SOLN: 5.0000 mg | Freq: Once | ORAL | Status: DC | PRN

## 2020-11-26 MED ORDER — LIDOCAINE HCL 1 % IJ SOLN
INTRAMUSCULAR | Status: DC | PRN
  Administered 2020-11-26: 15 mL

## 2020-11-26 MED ORDER — ENOXAPARIN SODIUM 40 MG/0.4ML ~~LOC~~ SOLN: 40.0000 mg | Freq: Every day | SUBCUTANEOUS | Status: DC

## 2020-11-26 MED ORDER — DEXAMETHASONE SODIUM PHOSPHATE 10 MG/ML IJ SOLN
INTRAMUSCULAR | Status: DC | PRN
  Administered 2020-11-26: 4 mg via INTRAVENOUS

## 2020-11-26 MED ORDER — ACETAMINOPHEN 500 MG PO TABS
1000.0000 mg | ORAL_TABLET | Freq: Four times a day (QID) | ORAL | Status: DC
  Administered 2020-11-26 – 2020-12-01 (×16): 1000 mg via ORAL
  Filled 2020-11-26 (×18): qty 2

## 2020-11-26 MED ORDER — EPHEDRINE SULFATE-NACL 50-0.9 MG/10ML-% IV SOSY
PREFILLED_SYRINGE | INTRAVENOUS | Status: DC | PRN
  Administered 2020-11-26: 10 mg via INTRAVENOUS

## 2020-11-26 MED ORDER — MORPHINE SULFATE (PF) 2 MG/ML IV SOLN
2.0000 mg | INTRAVENOUS | Status: DC | PRN
  Administered 2020-11-26 – 2020-11-28 (×5): 2 mg via INTRAVENOUS
  Filled 2020-11-26 (×6): qty 1

## 2020-11-26 MED ORDER — HYDROCHLOROTHIAZIDE 25 MG PO TABS
12.5000 mg | ORAL_TABLET | Freq: Every day | ORAL | Status: DC
  Administered 2020-11-27 – 2020-12-01 (×5): 12.5 mg via ORAL
  Filled 2020-11-26 (×5): qty 1

## 2020-11-26 MED ORDER — ROCURONIUM BROMIDE 10 MG/ML (PF) SYRINGE
PREFILLED_SYRINGE | INTRAVENOUS | Status: DC | PRN
  Administered 2020-11-26: 40 mg via INTRAVENOUS
  Administered 2020-11-26: 60 mg via INTRAVENOUS

## 2020-11-26 MED ORDER — SODIUM CHLORIDE 0.9 % IV SOLN
INTRAVENOUS | Status: AC | PRN
  Administered 2020-11-26: 1000 mL via INTRAMUSCULAR

## 2020-11-26 MED ORDER — METRONIDAZOLE IN NACL 5-0.79 MG/ML-% IV SOLN
500.0000 mg | Freq: Three times a day (TID) | INTRAVENOUS | Status: DC
  Administered 2020-11-26 – 2020-11-27 (×4): 500 mg via INTRAVENOUS
  Filled 2020-11-26 (×4): qty 100

## 2020-11-26 MED ORDER — FENTANYL CITRATE (PF) 100 MCG/2ML IJ SOLN
INTRAMUSCULAR | Status: AC
  Filled 2020-11-26: qty 2

## 2020-11-26 MED ORDER — POTASSIUM CHLORIDE IN NACL 20-0.9 MEQ/L-% IV SOLN
INTRAVENOUS | Status: DC
  Filled 2020-11-26 (×10): qty 1000

## 2020-11-26 MED ORDER — SUGAMMADEX SODIUM 200 MG/2ML IV SOLN
INTRAVENOUS | Status: DC | PRN
  Administered 2020-11-26: 200 mg via INTRAVENOUS

## 2020-11-26 MED ORDER — SIMETHICONE 80 MG PO CHEW
40.0000 mg | CHEWABLE_TABLET | Freq: Four times a day (QID) | ORAL | Status: DC | PRN
  Administered 2020-11-27 – 2020-11-29 (×4): 40 mg via ORAL
  Filled 2020-11-26 (×4): qty 1

## 2020-11-26 MED ORDER — MIDAZOLAM HCL 2 MG/2ML IJ SOLN
INTRAMUSCULAR | Status: AC
  Filled 2020-11-26: qty 2

## 2020-11-26 MED ORDER — LEVOTHYROXINE SODIUM 25 MCG PO TABS: 137.0000 ug | ORAL_TABLET | Freq: Every day | ORAL | Status: DC

## 2020-11-26 MED ORDER — PANTOPRAZOLE SODIUM 40 MG PO TBEC
80.0000 mg | DELAYED_RELEASE_TABLET | Freq: Every day | ORAL | Status: DC
  Administered 2020-11-27 – 2020-12-01 (×5): 80 mg via ORAL
  Filled 2020-11-26 (×5): qty 2

## 2020-11-26 MED ORDER — FENTANYL CITRATE (PF) 250 MCG/5ML IJ SOLN
INTRAMUSCULAR | Status: AC
  Filled 2020-11-26: qty 5

## 2020-11-26 MED ORDER — ESTRADIOL 1 MG PO TABS
1.0000 mg | ORAL_TABLET | Freq: Every day | ORAL | Status: DC
  Administered 2020-11-27 – 2020-12-01 (×5): 1 mg via ORAL
  Filled 2020-11-26 (×6): qty 1

## 2020-11-26 MED ORDER — ENSURE SURGERY PO LIQD
237.0000 mL | Freq: Two times a day (BID) | ORAL | Status: DC
  Administered 2020-11-27 – 2020-12-01 (×6): 237 mL via ORAL
  Filled 2020-11-26 (×10): qty 237

## 2020-11-26 MED ORDER — ONDANSETRON 4 MG PO TBDP
4.0000 mg | ORAL_TABLET | Freq: Four times a day (QID) | ORAL | Status: DC | PRN
  Administered 2020-11-29: 4 mg via ORAL
  Filled 2020-11-26: qty 1

## 2020-11-26 MED ORDER — ONDANSETRON HCL 4 MG/2ML IJ SOLN
INTRAMUSCULAR | Status: AC
  Filled 2020-11-26: qty 2

## 2020-11-26 MED ORDER — SODIUM CHLORIDE 0.9 % IV SOLN
2.0000 g | INTRAVENOUS | Status: DC
  Administered 2020-11-26 (×2): 2 g via INTRAVENOUS
  Filled 2020-11-26 (×3): qty 20
  Filled 2020-11-26: qty 2

## 2020-11-26 MED ORDER — PROPOFOL 10 MG/ML IV BOLUS
INTRAVENOUS | Status: DC | PRN
  Administered 2020-11-26: 150 mg via INTRAVENOUS

## 2020-11-26 MED ORDER — BUPIVACAINE LIPOSOME 1.3 % IJ SUSP
INTRAMUSCULAR | Status: AC
  Filled 2020-11-26: qty 20

## 2020-11-26 MED ORDER — LIDOCAINE 2% (20 MG/ML) 5 ML SYRINGE
INTRAMUSCULAR | Status: DC | PRN
  Administered 2020-11-26: 60 mg via INTRAVENOUS

## 2020-11-26 MED ORDER — HEPARIN SODIUM (PORCINE) 5000 UNIT/ML IJ SOLN
5000.0000 [IU] | Freq: Three times a day (TID) | INTRAMUSCULAR | Status: DC
  Administered 2020-11-27 – 2020-12-01 (×13): 5000 [IU] via SUBCUTANEOUS
  Filled 2020-11-26 (×13): qty 1

## 2020-11-26 MED ORDER — EPHEDRINE 5 MG/ML INJ
INTRAVENOUS | Status: AC
  Filled 2020-11-26: qty 30

## 2020-11-26 MED ORDER — ROCURONIUM BROMIDE 10 MG/ML (PF) SYRINGE
PREFILLED_SYRINGE | INTRAVENOUS | Status: AC
  Filled 2020-11-26: qty 20

## 2020-11-26 MED ORDER — LACTATED RINGERS IV SOLN: INTRAVENOUS | Status: DC | PRN

## 2020-11-26 MED ORDER — BUPIVACAINE-EPINEPHRINE 0.5% -1:200000 IJ SOLN
INTRAMUSCULAR | Status: AC
  Filled 2020-11-26: qty 1

## 2020-11-26 MED ORDER — OXYCODONE HCL 5 MG PO TABS: 5.0000 mg | ORAL_TABLET | Freq: Once | ORAL | Status: DC | PRN

## 2020-11-26 MED ORDER — MIDAZOLAM HCL 5 MG/5ML IJ SOLN
INTRAMUSCULAR | Status: DC | PRN
  Administered 2020-11-26: 2 mg via INTRAVENOUS

## 2020-11-26 MED ORDER — PHENYLEPHRINE HCL-NACL 10-0.9 MG/250ML-% IV SOLN
INTRAVENOUS | Status: DC | PRN
  Administered 2020-11-26: 25 ug/min via INTRAVENOUS

## 2020-11-26 MED ORDER — GABAPENTIN 600 MG PO TABS: 600.0000 mg | ORAL_TABLET | Freq: Three times a day (TID) | ORAL | Status: DC

## 2020-11-26 MED ORDER — ALUM & MAG HYDROXIDE-SIMETH 200-200-20 MG/5ML PO SUSP: 30.0000 mL | Freq: Four times a day (QID) | ORAL | Status: DC | PRN

## 2020-11-26 MED ORDER — PHENYLEPHRINE 40 MCG/ML (10ML) SYRINGE FOR IV PUSH (FOR BLOOD PRESSURE SUPPORT)
PREFILLED_SYRINGE | INTRAVENOUS | Status: AC
  Filled 2020-11-26: qty 10

## 2020-11-26 MED ORDER — ONDANSETRON HCL 4 MG/2ML IJ SOLN
INTRAMUSCULAR | Status: DC | PRN
  Administered 2020-11-26: 4 mg via INTRAVENOUS

## 2020-11-26 MED ORDER — ONDANSETRON HCL 4 MG/2ML IJ SOLN
4.0000 mg | Freq: Four times a day (QID) | INTRAMUSCULAR | Status: DC | PRN
  Administered 2020-11-27 – 2020-11-29 (×4): 4 mg via INTRAVENOUS
  Filled 2020-11-26 (×4): qty 2

## 2020-11-26 MED ORDER — LOSARTAN POTASSIUM 50 MG PO TABS
50.0000 mg | ORAL_TABLET | Freq: Every day | ORAL | Status: DC
  Administered 2020-11-27 – 2020-12-01 (×5): 50 mg via ORAL
  Filled 2020-11-26 (×5): qty 1

## 2020-11-26 MED ORDER — DIPHENHYDRAMINE HCL 12.5 MG/5ML PO ELIX
12.5000 mg | ORAL_SOLUTION | Freq: Four times a day (QID) | ORAL | Status: DC | PRN
  Administered 2020-11-30: 12.5 mg via ORAL
  Filled 2020-11-26: qty 10

## 2020-11-26 MED ORDER — ACETAMINOPHEN 325 MG PO TABS: 325.0000 mg | ORAL_TABLET | ORAL | Status: DC | PRN

## 2020-11-26 MED ORDER — LIDOCAINE 2% (20 MG/ML) 5 ML SYRINGE
INTRAMUSCULAR | Status: AC
  Filled 2020-11-26: qty 10

## 2020-11-26 MED ORDER — PROPOFOL 10 MG/ML IV BOLUS
INTRAVENOUS | Status: AC
  Filled 2020-11-26: qty 20

## 2020-11-26 MED ORDER — IBUPROFEN 600 MG PO TABS
600.0000 mg | ORAL_TABLET | Freq: Four times a day (QID) | ORAL | Status: DC | PRN
  Administered 2020-11-28 – 2020-11-30 (×4): 600 mg via ORAL
  Filled 2020-11-26 (×4): qty 1

## 2020-11-26 MED ORDER — DIPHENHYDRAMINE HCL 50 MG/ML IJ SOLN
12.5000 mg | Freq: Four times a day (QID) | INTRAMUSCULAR | Status: DC | PRN
  Administered 2020-11-27 – 2020-11-30 (×2): 12.5 mg via INTRAVENOUS
  Filled 2020-11-26 (×2): qty 1

## 2020-11-26 MED ORDER — TRAMADOL HCL 50 MG PO TABS
50.0000 mg | ORAL_TABLET | Freq: Four times a day (QID) | ORAL | Status: DC | PRN
  Administered 2020-11-26: 50 mg via ORAL
  Filled 2020-11-26: qty 1

## 2020-11-26 MED ORDER — MEPERIDINE HCL 25 MG/ML IJ SOLN: 6.2500 mg | INTRAMUSCULAR | Status: DC | PRN

## 2020-11-26 MED ORDER — DEXAMETHASONE SODIUM PHOSPHATE 10 MG/ML IJ SOLN
INTRAMUSCULAR | Status: AC
  Filled 2020-11-26: qty 1

## 2020-11-26 MED ORDER — AMISULPRIDE (ANTIEMETIC) 5 MG/2ML IV SOLN: 10.0000 mg | Freq: Once | INTRAVENOUS | Status: DC

## 2020-11-26 MED ORDER — MONTELUKAST SODIUM 10 MG PO TABS
10.0000 mg | ORAL_TABLET | Freq: Every day | ORAL | Status: DC
  Administered 2020-11-26 – 2020-11-30 (×5): 10 mg via ORAL
  Filled 2020-11-26 (×7): qty 1

## 2020-11-26 MED ORDER — FENTANYL CITRATE (PF) 250 MCG/5ML IJ SOLN
INTRAMUSCULAR | Status: DC | PRN
  Administered 2020-11-26: 50 ug via INTRAVENOUS
  Administered 2020-11-26: 150 ug via INTRAVENOUS
  Administered 2020-11-26 (×2): 50 ug via INTRAVENOUS

## 2020-11-26 MED ORDER — HYDROMORPHONE HCL 1 MG/ML IJ SOLN
0.5000 mg | INTRAMUSCULAR | Status: DC | PRN
  Administered 2020-11-27 – 2020-11-29 (×7): 0.5 mg via INTRAVENOUS
  Filled 2020-11-26 (×7): qty 1

## 2020-11-26 MED ORDER — PHENYLEPHRINE 40 MCG/ML (10ML) SYRINGE FOR IV PUSH (FOR BLOOD PRESSURE SUPPORT)
PREFILLED_SYRINGE | INTRAVENOUS | Status: DC | PRN
  Administered 2020-11-26: 120 ug via INTRAVENOUS
  Administered 2020-11-26 (×2): 80 ug via INTRAVENOUS

## 2020-11-26 MED ORDER — ONDANSETRON HCL 4 MG/2ML IJ SOLN: 4.0000 mg | Freq: Once | INTRAMUSCULAR | Status: DC | PRN

## 2020-11-26 MED ORDER — BUPIVACAINE LIPOSOME 1.3 % IJ SUSP
INTRAMUSCULAR | Status: DC | PRN
  Administered 2020-11-26: 20 mL

## 2020-11-26 MED ORDER — LEVOTHYROXINE SODIUM 25 MCG PO TABS
125.0000 ug | ORAL_TABLET | Freq: Every day | ORAL | Status: DC
  Administered 2020-11-27 – 2020-12-01 (×5): 125 ug via ORAL
  Filled 2020-11-26 (×5): qty 1

## 2020-11-26 MED ORDER — SODIUM CHLORIDE (PF) 0.9 % IJ SOLN
INTRAMUSCULAR | Status: DC | PRN
  Administered 2020-11-26: 15 mL

## 2020-11-26 SURGICAL SUPPLY — 72 items
APPLIER CLIP 5 13 M/L LIGAMAX5 (MISCELLANEOUS) ×2
APPLIER CLIP ROT 10 11.4 M/L (STAPLE)
BLADE CLIPPER SURG (BLADE) IMPLANT
CANISTER SUCT 3000ML PPV (MISCELLANEOUS) ×2 IMPLANT
CELLS DAT CNTRL 66122 CELL SVR (MISCELLANEOUS) IMPLANT
CHLORAPREP W/TINT 26 (MISCELLANEOUS) ×2 IMPLANT
CLIP APPLIE 5 13 M/L LIGAMAX5 (MISCELLANEOUS) ×1 IMPLANT
CLIP APPLIE ROT 10 11.4 M/L (STAPLE) IMPLANT
COVER MAYO STAND STRL (DRAPES) ×2 IMPLANT
COVER SURGICAL LIGHT HANDLE (MISCELLANEOUS) ×4 IMPLANT
COVER WAND RF STERILE (DRAPES) IMPLANT
DRSG COVADERM PLUS 2X2 (GAUZE/BANDAGES/DRESSINGS) ×2 IMPLANT
DRSG OPSITE POSTOP 3X4 (GAUZE/BANDAGES/DRESSINGS) ×6 IMPLANT
DRSG OPSITE POSTOP 4X10 (GAUZE/BANDAGES/DRESSINGS) IMPLANT
DRSG OPSITE POSTOP 4X8 (GAUZE/BANDAGES/DRESSINGS) IMPLANT
DRSG TEGADERM 4X4.5 CHG (GAUZE/BANDAGES/DRESSINGS) ×2 IMPLANT
ELECT BLADE 6.5 EXT (BLADE) ×2 IMPLANT
ELECT CAUTERY BLADE 6.4 (BLADE) ×4 IMPLANT
ELECT REM PT RETURN 9FT ADLT (ELECTROSURGICAL) ×2
ELECTRODE REM PT RTRN 9FT ADLT (ELECTROSURGICAL) ×1 IMPLANT
GEL ULTRASOUND 20GR AQUASONIC (MISCELLANEOUS) IMPLANT
GLOVE BIO SURGEON STRL SZ7.5 (GLOVE) ×4 IMPLANT
GLOVE INDICATOR 8.0 STRL GRN (GLOVE) ×4 IMPLANT
GOWN STRL REUS W/ TWL LRG LVL3 (GOWN DISPOSABLE) ×6 IMPLANT
GOWN STRL REUS W/ TWL XL LVL3 (GOWN DISPOSABLE) ×2 IMPLANT
GOWN STRL REUS W/TWL LRG LVL3 (GOWN DISPOSABLE) ×6
GOWN STRL REUS W/TWL XL LVL3 (GOWN DISPOSABLE) ×2
HANDLE SUCTION POOLE (INSTRUMENTS) ×3 IMPLANT
KIT BASIN OR (CUSTOM PROCEDURE TRAY) ×2 IMPLANT
KIT SIGMOIDOSCOPE (SET/KITS/TRAYS/PACK) IMPLANT
NS IRRIG 1000ML POUR BTL (IV SOLUTION) ×4 IMPLANT
PACK COLON (CUSTOM PROCEDURE TRAY) ×2 IMPLANT
PAD ARMBOARD 7.5X6 YLW CONV (MISCELLANEOUS) ×4 IMPLANT
PENCIL SMOKE EVACUATOR (MISCELLANEOUS) ×2 IMPLANT
RELOAD PROXIMATE 75MM BLUE (ENDOMECHANICALS) ×6 IMPLANT
RTRCTR WOUND ALEXIS 18CM MED (MISCELLANEOUS)
SCISSORS LAP 5X35 DISP (ENDOMECHANICALS) ×4 IMPLANT
SEALER TISSUE G2 STRG ARTC 35C (ENDOMECHANICALS) ×2 IMPLANT
SET IRRIG TUBING LAPAROSCOPIC (IRRIGATION / IRRIGATOR) ×2 IMPLANT
SET TUBE SMOKE EVAC HIGH FLOW (TUBING) ×2 IMPLANT
SLEEVE ENDOPATH XCEL 5M (ENDOMECHANICALS) ×4 IMPLANT
SPECIMEN JAR LARGE (MISCELLANEOUS) ×2 IMPLANT
STAPLER 90 3.5 STAND SLIM (STAPLE) ×2
STAPLER 90 3.5 STD SLIM (STAPLE) ×1 IMPLANT
STAPLER PROXIMATE 75MM BLUE (STAPLE) ×2 IMPLANT
STAPLER VISISTAT 35W (STAPLE) ×2 IMPLANT
SUCTION POOLE HANDLE (INSTRUMENTS) ×6
SURGILUBE 2OZ TUBE FLIPTOP (MISCELLANEOUS) IMPLANT
SUT MNCRL AB 4-0 PS2 18 (SUTURE) ×2 IMPLANT
SUT PDS AB 1 CT  36 (SUTURE)
SUT PDS AB 1 CT 36 (SUTURE) IMPLANT
SUT PDS AB 1 TP1 96 (SUTURE) IMPLANT
SUT PROLENE 2 0 CT2 30 (SUTURE) IMPLANT
SUT PROLENE 2 0 KS (SUTURE) IMPLANT
SUT SILK 2 0 SH CR/8 (SUTURE) ×2 IMPLANT
SUT SILK 2 0 TIES 10X30 (SUTURE) ×2 IMPLANT
SUT SILK 3 0 SH CR/8 (SUTURE) ×2 IMPLANT
SUT SILK 3 0 TIES 10X30 (SUTURE) ×2 IMPLANT
SUT VICRYL 0 UR6 27IN ABS (SUTURE) ×2 IMPLANT
SYR BULB IRRIG 60ML STRL (SYRINGE) ×2 IMPLANT
SYS LAPSCP GELPORT 120MM (MISCELLANEOUS) ×2
SYSTEM LAPSCP GELPORT 120MM (MISCELLANEOUS) ×1 IMPLANT
TOWEL GREEN STERILE (TOWEL DISPOSABLE) ×2 IMPLANT
TRAY FOLEY MTR SLVR 16FR STAT (SET/KITS/TRAYS/PACK) ×2 IMPLANT
TROCAR XCEL 12X100 BLDLESS (ENDOMECHANICALS) ×2 IMPLANT
TROCAR XCEL BLUNT TIP 100MML (ENDOMECHANICALS) IMPLANT
TROCAR XCEL NON-BLD 11X100MML (ENDOMECHANICALS) IMPLANT
TROCAR XCEL NON-BLD 5MMX100MML (ENDOMECHANICALS) ×2 IMPLANT
TUBE CONNECTING 12X1/4 (SUCTIONS) ×4 IMPLANT
TUBING EVAC SMOKE HEATED PNEUM (TUBING) ×2 IMPLANT
WATER STERILE IRR 1000ML POUR (IV SOLUTION) ×2 IMPLANT
YANKAUER SUCT BULB TIP NO VENT (SUCTIONS) ×4 IMPLANT

## 2020-11-26 NOTE — ED Notes (Signed)
Attempted to give report to 6N. RN unable to take report at this time.

## 2020-11-26 NOTE — ED Notes (Signed)
Daughter at bedside states that pt had Covid in January of 2022.

## 2020-11-26 NOTE — ED Provider Notes (Incomplete)
[facility] EMERGENCY DEPARTMENT Provider Note   CSN: [id number] Arrival date & time: 11/25/20  1719     History Chief Complaint  Patient presents with  . Abdominal Pain    Julia Green is a 50 y.o. female with a history of prior hysterectomy and salpingectomy, hypertension, hypothyroidism, GERD, and asthma who presents to the emergency department due to abnormal CT findings of appendicitis.  Patient states  HPI     Past Medical History:  Diagnosis Date  . Arthritis   . Asthma   . Depression   . Fatty liver   . Gallstones   . GERD (gastroesophageal reflux disease)   . Headache   . History of low potassium   . Hypertension   . Hypothyroidism   . Right arm numbness   . Right leg numbness   . Sciatic nerve pain   . Thyroid disease     Patient Active Problem List   Diagnosis Date Noted  . Carpal tunnel syndrome on right 08/07/2019  . Right low back pain 07/17/2018  . Neck pain 07/17/2018  . Postoperative state 06/26/2017  . Rotator cuff syndrome of right shoulder 05/30/2017  . 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  . Abdominal bloating 03/22/2017  . Other specified hypothyroidism 11/16/2016  . Dysmenorrhea 07/20/2016  . 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  . Dyspareunia, female 06/16/2016    Past Surgical History:  Procedure Laterality Date  . CARPAL TUNNEL RELEASE Right 08/07/2019   Procedure: RIGHT CARPAL TUNNEL RELEASE;  Surgeon: Leandrew Koyanagi, MD;  Location: [facility];  Service: Orthopedics;  Laterality: Right;  . COLONOSCOPY    . LAPAROSCOPIC VAGINAL HYSTERECTOMY WITH SALPINGECTOMY Bilateral 06/26/2017   Procedure: LAPAROSCOPIC ASSISTED VAGINAL HYSTERECTOMY WITH SALPINGECTOMY;  Surgeon: Princess Bruins, MD;  Location: [facility];  Service: Gynecology;  Laterality: Bilateral;  request 7:30am OR time  requests 2 hours Rex the lighted retractor rep will be here   . UPPER GI ENDOSCOPY        OB History    Gravida  4   Para  4   Term      Preterm      AB      Living  4     SAB      IAB      Ectopic      Multiple      Live Births              Family History  Problem Relation Age of Onset  . Hypertension Mother   . Uterine cancer Mother   . Cancer Sister        unknown  . Kidney failure Sister   . Stomach cancer Maternal Aunt   . Kidney failure Father   . Lung disease Father   . High blood pressure Father   . Colon cancer Neg Hx   . Esophageal cancer Neg Hx   . Rectal cancer Neg Hx     Social History   Tobacco Use  . Smoking status: Never Smoker  . Smokeless tobacco: Never Used  Vaping Use  . Vaping Use: Never used  Substance Use Topics  . Alcohol use: No  . Drug use: No    Home Medications Prior to Admission medications   Medication Sig Start Date End Date Taking? Authorizing Provider  albuterol (PROVENTIL HFA;VENTOLIN HFA) 108 (90 Base) MCG/ACT inhaler Inhale 1-2 puffs into the lungs every 6 (six) hours as needed for wheezing  or shortness of breath.    [provider]  aspirin EC 81 MG tablet Take 81 mg by mouth daily.    [provider]  calcium carbonate (OSCAL) 1500 (600 Ca) MG TABS tablet Take 1,500 mg by mouth daily.    [provider]  estradiol (ESTRACE) 1 MG tablet Take 1 tablet (1 mg total) by mouth daily. 12/11/19   Princess Bruins, MD  fluticasone (FLONASE) 50 MCG/ACT nasal spray Place 1 spray into both nostrils 2 (two) times daily.    [provider]  gabapentin (NEURONTIN) 600 MG tablet Take 600 mg by mouth 3 (three) times daily.     [provider]  hydrochlorothiazide (HYDRODIURIL) 12.5 MG tablet Take 12.5 mg by mouth daily.    [provider]  levothyroxine (SYNTHROID, LEVOTHROID) 137 MCG tablet Take 1 tablet (137 mcg total) by mouth daily before breakfast. 11/21/16   Terrance Mass, MD  losartan (COZAAR) 25 MG tablet Take 25 mg by mouth daily.    [provider]  losartan (COZAAR) 50 MG tablet Take 50 mg by mouth daily.    [provider]  montelukast (SINGULAIR) 10 MG tablet Take 10 mg by mouth at bedtime.    [provider]  Omega-3 Fatty Acids (FISH OIL) 1000 MG CAPS Take by mouth.    [provider]  omeprazole (PRILOSEC) 40 MG capsule Take 1 capsule (40 mg total) by mouth daily. 04/05/17   Doran Stabler, MD  vitamin C (ASCORBIC ACID) 500 MG tablet Take 500 mg by mouth daily.    [provider]    Allergies    Patient has no known allergies.  Review of Systems   Review of Systems  Physical Exam Updated Vital Signs BP (!) 120/94 (BP Location: Right Arm)   Pulse 93   Temp 98.8 F (37.1 C) (Oral)   Resp 16   LMP 03/24/2017   SpO2 99%   Physical Exam  ED Results / Procedures / Treatments   Labs (all labs ordered are listed, but only abnormal results are displayed) Labs Reviewed  CBC WITH DIFFERENTIAL/PLATELET - Abnormal; Notable for the following components:      Result Value   HCT 35.2 (*)    All other components within normal limits  COMPREHENSIVE METABOLIC PANEL - Abnormal; Notable for the following components:   Potassium 3.1 (*)    Glucose, Bld 100 (*)    All other components within normal limits  URINALYSIS, ROUTINE W REFLEX MICROSCOPIC - Abnormal; Notable for the following components:   Color, Urine STRAW (*)    All other components within normal limits  LIPASE, BLOOD  I-STAT BETA HCG BLOOD, ED ([facility], [facility], [facility] ONLY)    EKG None  Radiology No results found.  Procedures Procedures {Remember to document critical care time when appropriate:1}  Medications Ordered in ED Medications - No data to display  ED Course  I have reviewed the triage vital signs and the nursing notes.  Pertinent labs & imaging results that were available during my care of the patient were reviewed by me and considered in my medical decision making (see chart for details).    MDM  Rules/Calculators/A&P                          *** Final Clinical Impression(s) / ED Diagnoses Final diagnoses:  None    Rx / DC Orders ED Discharge Orders    None

## 2020-11-26 NOTE — Transfer of Care (Signed)
Immediate Anesthesia Transfer of Care Note  Patient: Julia Green [place]  Procedure(s) Performed: LAPAROSCOPIC RIGHT HEMI COLECTOMY (N/A )  Patient Location: PACU  Anesthesia Type:General  Level of Consciousness: drowsy and patient cooperative  Airway & Oxygen Therapy: Patient Spontanous Breathing and Patient connected to nasal cannula oxygen  Post-op Assessment: Report given to RN and Post -op Vital signs reviewed and stable  Post vital signs: Reviewed and stable  Last Vitals:  Vitals Value Taken Time  BP    Temp    Pulse 86 11/26/20 1738  Resp 19 11/26/20 1738  SpO2 100 % 11/26/20 1738  Vitals shown include unvalidated device data.  Last Pain:  Vitals:   11/26/20 1343  TempSrc:   PainSc: 2          Complications: No complications documented.

## 2020-11-26 NOTE — Anesthesia Procedure Notes (Signed)
Procedure Name: Intubation Date/Time: 11/26/2020 2:24 PM Performed by: Renato Shin, CRNA Pre-anesthesia Checklist: Patient identified, Emergency Drugs available, Suction available and Patient being monitored Patient Re-evaluated:Patient Re-evaluated prior to induction Oxygen Delivery Method: Circle system utilized Preoxygenation: Pre-oxygenation with 100% oxygen Induction Type: IV induction Ventilation: Mask ventilation without difficulty Laryngoscope Size: Miller and 2 Grade View: Grade I Tube type: Oral Tube size: 7.0 mm Number of attempts: 1 Airway Equipment and Method: Stylet and Oral airway Placement Confirmation: ETT inserted through vocal cords under direct vision,  positive ETCO2 and breath sounds checked- equal and bilateral Secured at: 21 cm Tube secured with: Tape Dental Injury: Teeth and Oropharynx as per pre-operative assessment

## 2020-11-26 NOTE — Op Note (Signed)
PATIENT: Julia Green  50 y.o. female  Patient Care Team: Chrystie Nose as PCP - General (Physician Assistant) Louanna Raw, RN as Registered Nurse  PREOP DIAGNOSIS: Cecal mass with appendicitis  POSTOP DIAGNOSIS: Same  PROCEDURE: Laparoscopic right hemicolectomy 2. Bilateral transversus abdominus plane blocks  SURGEON: Sharon Mt. Dema Severin, MD   ASSISTANT: Caryn Bee, MD PGY-6  ANESTHESIA: General endotracheal  EBL: 100 mL Total I/O In: 2112.4 [I.V.:1785.7; IV Piggyback:326.7] Out: 200 [Urine:100; Blood:100]  DRAINS: None  SPECIMEN: Right colon (including terminal ileum, appendix)  COUNTS: Sponge, needle and instrument counts were reported correct x2  FINDINGS:  The liver is normal in appearance.  The peritoneal surfaces are normal in appearance.  She does have some scar tissue along the right fallopian tube and ovary but these organs are normal in appearance.  The appendix is able to be freed from the right pelvic sidewall.  The associated omentum which has likely become involved in this process was included with the specimen.  Bulky mass in the cecum with a clearly abnormal appearing appendix.  There is no evident perforation or abscess.  There is no obvious tumor spillage either.  Bulky ileocolic adenopathy extending onto the SMA nodal chain.  There is at least 1 large 2-3 cm lymph node that remains in situ on the SMA overlying the 3rd/4th portion of the duodenum. Right hemicolectomy carried out with stapled side to side anastomosis.  NARRATIVE:  The patient was identified & brought into the operating room, placed supine on the operating table and SCDs were applied to the lower extremities. General endotracheal anesthesia was induced. The patient was positioned supine with arms tucked. Antibiotics were administered. A foley catheter was placed under sterile conditions. Hair in the region of planned surgery was clipped. The abdomen was prepped and  draped in a sterile fashion. A timeout was performed confirming our patient and plan.   Beginning with the extraction port, a supraumbilical incision was made and carried down to the midline fascia. This was then incised with electrocautery. The peritoneum was identified and elevated between clamps and carefully opened sharply. A small Alexis wound protector with a cap and associated port was then placed. The abdomen was insufflated to 15 mmHg with CO2. A laparoscope was placed and camera inspection revealed no evidence of injury. Bilateral TAP blocks were then performed under laparoscopic visualization using a mixture of 0.25% marcaine with epinepherine + Exparel. 3 additional ports were then placed under direct laparoscopic visualization - two in the left hemiabdomen and one in the right abdomen. The abdomen was surveyed. The liver and peritoneum appeared normal.  There were no signs of metastatic disease.  Omental adhesions were found in the pelvis from her prior hysterectomy and are lysed using the Enseal device.  The small bowel was able to be delivered out of the pelvis.  There is a bulky large mass on the cecum with a large abnormal appearing firm appendix as well.  There is significantly adhered omentum over this.  The omentum was divided leaving this involved omentum attached.  The patient was positioned in trendelenburg with left side down.  Beginning with a lateral to medial approach, the appendix is carefully freed and able to be mobilized medially without significant difficulty.  The cecum was also able to be mobilized medially.  There are some attachments of the cecum over the right retroperitoneum.  The right tube and ovary are normal in appearance.  This was overlying the gonadal vessels.  This is well away from the right ureter which is identified and protected.  The gonadal vessels were also able to be protected.  The ascending colon was mobilized.  The hepatic flexure was approached and  partially mobilized in this direction.  She is then repositioned in reverse Trendelenburg.  The omentum is elevated anteriorly.  The lesser sac is entered.  The plane between the transverse mesocolon and the retroperitoneum is gained.  The spleen is developed, fully mobilized the hepatic flexure.  The duodenum was identified and protected free of injury.  The terminal ileum easily reaches into the left upper quadrant without difficulty.  At this point, given the bulkiness of her disease and what appears to be some extension into the ileocolic mesentery, the decision is made to proceed with the remainder portion of the procedure through the wound protector.  Towels were placed around the field.  The specimen was delivered through the midline wound after we extended our incision as this is quite large.  The specimen is examined.  There is no evident perforation.  The terminal ileum was then transected using a GIA blue load stapler.  A Kary Kos is placed on this segment of small bowel to help maintain orientation.  The ileocolic pedicle was identified.  The mesentery was divided with an Enseal device.  She does have bulky adenopathy extending down the ileocolic chain which is partially included with this as high as we are able to safely go.  She has additional large 2 to 3 cm lymph node on the SMA chain overlying the third/fourth portion of the duodenum.  The distal point of transection was identified on the transverse colon and an area that includes the right branch of the middle colic.  A window was created the mesentery and the transverse colon was divided with a GIA 75 mm blue load stapler.  The remaining mesentery is then ligated with the Enseal device including this right branch.  There is a nice palpable pulse in the mesentery going out to the remaining transverse colon.  The specimen is passed off.  Attention was turned to creating the anastomosis. The terminal ileum and transverse colon were inspected for  orientation to ensure no twisting nor bowel included in the mesenteric defect. An anastomosis was created between the terminal ileum and the transverse colon using a 75 mm GIA blue load stapler. The staple line was inspected and noted to be hemostatic.  The common enterotomy channel was closed using a TA 60 blue load stapler. Hemostasis was achieved at the staple line using 3-0 silk U-stitches. 3-0 silk sutures were used to imbricate the corners of the staple line as well.  A 2-0 silk suture was placed securing the "crotch" of the anastomosis. The anastomosis was palpated and noted to be widely patent. This was then placed back into the abdomen. The abdomen was then irrigated with sterile saline and hemostasis verified. The omentum was then brought down over the anastomosis. The wound protector cap was replaced and CO2 reinsufflated. The laparoscopic ports were removed under direct visualization and the sites noted to be hemostatic. The Alexis wound protector was removed, counts were reported correct, and we switched to clean instruments, gowns and drapes.   The fascia was then closed using two running #1 PDS sutures.  The skin of all incision sites was closed with 4-0 monocryl subcuticular suture. Dermabond was placed on the port sites and a sterile dressing was placed over the abdominal incision. All counts were reported correct x2.  The patient was then awakened from anesthesia and sent to the post anesthesia care unit in stable condition.    DISPOSITION: PACU in satisfactory condition

## 2020-11-26 NOTE — Anesthesia Postprocedure Evaluation (Signed)
Anesthesia Post Note  Patient: Julia Green  Procedure(s) Performed: LAPAROSCOPIC RIGHT HEMI COLECTOMY (N/A )     Patient location during evaluation: PACU Anesthesia Type: General Level of consciousness: awake Pain management: pain level controlled Vital Signs Assessment: post-procedure vital signs reviewed and stable Respiratory status: spontaneous breathing, nonlabored ventilation, respiratory function stable and patient connected to nasal cannula oxygen Cardiovascular status: blood pressure returned to baseline and stable Postop Assessment: no apparent nausea or vomiting Anesthetic complications: no   No complications documented.  Last Vitals:  Vitals:   11/26/20 1900 11/26/20 1958  BP: (!) 130/100 131/84  Pulse: 81 80  Resp: 16 17  Temp: 36.6 C 36.6 C  SpO2:  100%    Last Pain:  Vitals:   11/26/20 1958  TempSrc: Oral  PainSc:                  Omeed Osuna P Ryiah Bellissimo

## 2020-11-26 NOTE — ED Notes (Signed)
Report given to Liz RN

## 2020-11-26 NOTE — Anesthesia Preprocedure Evaluation (Addendum)
Anesthesia Evaluation  Patient identified by MRN, date of birth, ID band Patient awake    Reviewed: Allergy & Precautions, NPO status , Patient's Chart, lab work & pertinent test results  Airway Mallampati: II  TM Distance: >3 FB Neck ROM: Full    Dental no notable dental hx. (+) Teeth Intact   Pulmonary asthma ,    Pulmonary exam normal breath sounds clear to auscultation       Cardiovascular hypertension, Pt. on medications Normal cardiovascular exam Rhythm:Regular Rate:Normal     Neuro/Psych  Headaches, PSYCHIATRIC DISORDERS Depression  Neuromuscular disease    GI/Hepatic GERD  Medicated and Controlled,  Endo/Other  Hypothyroidism Obesity  Renal/GU      Musculoskeletal  (+) Arthritis , Osteoarthritis,  Spondylosis lumbar spine Right carpal tunnel syndrome   Abdominal (+) + obese,   Peds  Hematology   Anesthesia Other Findings colon mass appendicitis  Reproductive/Obstetrics                            Anesthesia Physical  Anesthesia Plan  ASA: III  Anesthesia Plan: General   Post-op Pain Management:    Induction: Intravenous  PONV Risk Score and Plan: 4 or greater and Ondansetron, Treatment may vary due to age or medical condition, Midazolam and Dexamethasone  Airway Management Planned: Oral ETT  Additional Equipment: None  Intra-op Plan:   Post-operative Plan: Extubation in OR  Informed Consent: I have reviewed the patients History and Physical, chart, labs and discussed the procedure including the risks, benefits and alternatives for the proposed anesthesia with the patient or authorized representative who has indicated his/her understanding and acceptance.     Dental advisory given and Interpreter used for interveiw  Plan Discussed with: CRNA  Anesthesia Plan Comments: (Pre-operative evaluation completed in OR 1 prior to induction. )       Anesthesia Quick  Evaluation

## 2020-11-26 NOTE — ED Provider Notes (Signed)
[facility] EMERGENCY DEPARTMENT Provider Note   CSN: [id number] Arrival date & time: 11/25/20  1719     History Chief Complaint  Patient presents with  . Abdominal Pain    Julia Green is a 50 y.o. female with a history of prior hysterectomy and salpingectomy, hypertension, hypothyroidism, GERD, and asthma who presents to the emergency department due to abnormal CT findings of appendicitis.  Patient states that she has had abdominal pain for the past 1 month, constant pain to the right lower quadrant, worsened over past few days. Has pain with BMs & urination causing trouble with this. Has had diarrhea, last BM was at 1800. Had outpatient CT with findings of appendicitis & was told to come to the ED. Last PO intake was 1400.  Denies fever, vomiting, melena, hematochezia, or urinary frequency.  Patient is primarily Spanish-speaking, discussed formal translator, she prefers for her daughter to translate.  History was provided by patient through patient's daughter via interpretation.  HPI     Past Medical History:  Diagnosis Date  . Arthritis   . Asthma   . Depression   . Fatty liver   . Gallstones   . GERD (gastroesophageal reflux disease)   . Headache   . History of low potassium   . Hypertension   . Hypothyroidism   . Right arm numbness   . Right leg numbness   . Sciatic nerve pain   . Thyroid disease     Patient Active Problem List   Diagnosis Date Noted  . Acute appendicitis 11/26/2020  . Carpal tunnel syndrome on right 08/07/2019  . Right low back pain 07/17/2018  . Neck pain 07/17/2018  . Postoperative state 06/26/2017  . Rotator cuff syndrome of right shoulder 05/30/2017  . 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  . Abdominal bloating 03/22/2017  . Other specified hypothyroidism 11/16/2016  . Dysmenorrhea 07/20/2016  . 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  . Dyspareunia, female 06/16/2016    Past  Surgical History:  Procedure Laterality Date  . CARPAL TUNNEL RELEASE Right 08/07/2019   Procedure: RIGHT CARPAL TUNNEL RELEASE;  Surgeon: Leandrew Koyanagi, MD;  Location: [facility];  Service: Orthopedics;  Laterality: Right;  . COLONOSCOPY    . LAPAROSCOPIC VAGINAL HYSTERECTOMY WITH SALPINGECTOMY Bilateral 06/26/2017   Procedure: LAPAROSCOPIC ASSISTED VAGINAL HYSTERECTOMY WITH SALPINGECTOMY;  Surgeon: Princess Bruins, MD;  Location: [facility];  Service: Gynecology;  Laterality: Bilateral;  request 7:30am OR time  requests 2 hours Rex the lighted retractor rep will be here   . UPPER GI ENDOSCOPY       OB History    Gravida  4   Para  4   Term      Preterm      AB      Living  4     SAB      IAB      Ectopic      Multiple      Live Births              Family History  Problem Relation Age of Onset  . Hypertension Mother   . Uterine cancer Mother   . Cancer Sister        unknown  . Kidney failure Sister   . Stomach cancer Maternal Aunt   . Kidney failure Father   . Lung disease Father   . High blood pressure Father   . Colon cancer Neg Hx   .  Esophageal cancer Neg Hx   . Rectal cancer Neg Hx     Social History   Tobacco Use  . Smoking status: Never Smoker  . Smokeless tobacco: Never Used  Vaping Use  . Vaping Use: Never used  Substance Use Topics  . Alcohol use: No  . Drug use: No    Home Medications Prior to Admission medications   Medication Sig Start Date End Date Taking? Authorizing Provider  albuterol (PROVENTIL HFA;VENTOLIN HFA) 108 (90 Base) MCG/ACT inhaler Inhale 1-2 puffs into the lungs every 6 (six) hours as needed for wheezing or shortness of breath.   Yes [provider]  aspirin EC 81 MG tablet Take 81 mg by mouth daily.   Yes [provider]  calcium carbonate (OSCAL) 1500 (600 Ca) MG TABS tablet Take 1,500 mg by mouth daily.   Yes [provider]  estradiol (ESTRACE) 1 MG tablet Take 1  tablet (1 mg total) by mouth daily. 12/11/19  Yes Princess Bruins, MD  hydrochlorothiazide (HYDRODIURIL) 12.5 MG tablet Take 12.5 mg by mouth daily.   Yes [provider]  levothyroxine (SYNTHROID, LEVOTHROID) 137 MCG tablet Take 1 tablet (137 mcg total) by mouth daily before breakfast. 11/21/16  Yes Terrance Mass, MD  losartan (COZAAR) 25 MG tablet Take 25 mg by mouth daily. (takes with 50 mg tab; total 39m daily)   Yes [provider]  losartan (COZAAR) 50 MG tablet Take 50 mg by mouth daily. (takes with 25 mg tab; total 737mdaily)   Yes [provider]  montelukast (SINGULAIR) 10 MG tablet Take 10 mg by mouth at bedtime.   Yes [provider]  omeprazole (PRILOSEC) 40 MG capsule Take 1 capsule (40 mg total) by mouth daily. 04/05/17  Yes Danis, HeKirke CorinMD  vitamin C (ASCORBIC ACID) 500 MG tablet Take 500 mg by mouth daily.   Yes [provider]    Allergies    Patient has no known allergies.  Review of Systems   Review of Systems  Constitutional: Negative for fever.  Cardiovascular: Negative for chest pain.  Gastrointestinal: Positive for abdominal pain and diarrhea. Negative for blood in stool, nausea and vomiting.  Genitourinary: Negative for frequency.  Neurological: Negative for syncope.  All other systems reviewed and are negative.   Physical Exam Updated Vital Signs BP (!) 120/94 (BP Location: Right Arm)   Pulse 93   Temp 98.8 F (37.1 C) (Oral)   Resp 16   LMP 03/24/2017   SpO2 99%   Physical Exam Vitals and nursing note reviewed.  Constitutional:      General: She is not in acute distress.    Appearance: She is well-developed. She is not toxic-appearing.  HENT:     Head: Normocephalic and atraumatic.  Eyes:     General:        Right eye: No discharge.        Left eye: No discharge.     Conjunctiva/sclera: Conjunctivae normal.  Cardiovascular:     Rate and Rhythm: Normal rate and regular rhythm.  Pulmonary:      Effort: Pulmonary effort is normal. No respiratory distress.     Breath sounds: Normal breath sounds. No wheezing, rhonchi or rales.  Abdominal:     General: Bowel sounds are normal. There is no distension.     Palpations: Abdomen is soft.     Tenderness: There is abdominal tenderness in the right lower quadrant. Positive signs include McBurney's sign.  Musculoskeletal:  Cervical back: Neck supple.  Skin:    General: Skin is warm and dry.     Findings: No rash.  Neurological:     Mental Status: She is alert.     Comments: Clear speech.   Psychiatric:        Behavior: Behavior normal.     ED Results / Procedures / Treatments   Labs (all labs ordered are listed, but only abnormal results are displayed) Labs Reviewed  CBC WITH DIFFERENTIAL/PLATELET - Abnormal; Notable for the following components:      Result Value   HCT 35.2 (*)    All other components within normal limits  COMPREHENSIVE METABOLIC PANEL - Abnormal; Notable for the following components:   Potassium 3.1 (*)    Glucose, Bld 100 (*)    All other components within normal limits  URINALYSIS, ROUTINE W REFLEX MICROSCOPIC - Abnormal; Notable for the following components:   Color, Urine STRAW (*)    All other components within normal limits  RESP PANEL BY RT-PCR (FLU A&B, COVID) ARPGX2  LIPASE, BLOOD  HIV ANTIBODY (ROUTINE TESTING W REFLEX)  I-STAT BETA HCG BLOOD, ED ([facility], [facility], [facility] ONLY)    EKG None  Radiology No results found.  Procedures Procedures   Medications Ordered in ED Medications  hydrochlorothiazide (HYDRODIURIL) tablet 12.5 mg (has no administration in time range)  losartan (COZAAR) tablet 50 mg (has no administration in time range)  estradiol (ESTRACE) tablet 1 mg (has no administration in time range)  gabapentin (NEURONTIN) tablet 600 mg (has no administration in time range)  pantoprazole (PROTONIX) EC tablet 80 mg (has no administration in time range)  montelukast (SINGULAIR) tablet 10  mg (has no administration in time range)  enoxaparin (LOVENOX) injection 40 mg (has no administration in time range)  0.9 % NaCl with KCl 20 mEq/ L  infusion (has no administration in time range)  cefTRIAXone (ROCEPHIN) 2 g in sodium chloride 0.9 % 100 mL IVPB (2 g Intravenous New Bag/Given 11/26/20 0142)    And  metroNIDAZOLE (FLAGYL) IVPB 500 mg (500 mg Intravenous New Bag/Given 11/26/20 0142)  morphine 2 MG/ML injection 2-4 mg (has no administration in time range)  ondansetron (ZOFRAN-ODT) disintegrating tablet 4 mg (has no administration in time range)    Or  ondansetron (ZOFRAN) injection 4 mg (has no administration in time range)  levothyroxine (SYNTHROID) tablet 125 mcg (has no administration in time range)    ED Course  I have reviewed the triage vital signs and the nursing notes.  Pertinent labs & imaging results that were available during my care of the patient were reviewed by me and considered in my medical decision making (see chart for details).    MDM Rules/Calculators/A&P                          Patient presents to the ED with complaints of right lower quadrant abdominal pain over the past month that is acutely worsened over the past few days.  Nontoxic, vitals without significant abnormality.  Focal right lower quadrant tenderness to palpation on exam.  Additional history obtained:  Additional history obtained from chart review & nursing note review.   CT abdomen/pelvis with contrast performed 11/25/2020: IMPRESSION:  1. CT findings consistent with appendicitis due to an obstructing  cecal mass. Recommend surgical consultation.  2. Enlarged ileocolic lymph nodes but no hepatic lesions.  3. 5 mm subtle right lower lobe pulmonary nodule is indeterminate.  Follow-up chest CT  may be helpful for further evaluation.  4. No other significant abdominal/pelvic findings.   Lab Tests:  I reviewed and interpreted labs, which included:  CBC, CMP, lipase, pregnancy test,  urinalysis: Fairly unremarkable, mild hypokalemia  ED Course:  Case was discussed with general surgeon Dr. Gershon Crane- he has placed admission orders, started abx, and initiated symptomatic management.   Portions of this note were generated with L[employer]. Dictation errors may occur despite best attempts at proofreading.  Final Clinical Impression(s) / ED Diagnoses Final diagnoses:  Acute appendicitis, unspecified acute appendicitis type    Rx / DC Orders ED Discharge Orders    None       Amaryllis Dyke, PA-C 11/26/20 0215    Tegeler, Gwenyth Allegra, MD 11/26/20 [phone]

## 2020-11-26 NOTE — Progress Notes (Addendum)
Subjective No acute events. Still with persistent RLQ pain. No nausea or vomiting.  No known family history of colorectal, breast, ovarian, endometrial or cervical cancers  No prior colonoscopy  PSH: Vaginal hysterectomy  Objective: Vital signs in last 24 hours: Temp:  [98.8 F (37.1 C)] 98.8 F (37.1 C) (04/06 2024) Pulse Rate:  [69-93] 78 (04/07 0800) Resp:  [11-20] 12 (04/07 0800) BP: (116-141)/(72-100) 130/100 (04/07 0800) SpO2:  [96 %-100 %] 98 % (04/07 0800)    Intake/Output from previous day: No intake/output data recorded. Intake/Output this shift: No intake/output data recorded.  Gen: NAD, comfortable CV: RRR Pulm: Normal work of breathing Abd: Soft, focally tender in RLQ; no tenderness elsewhere. No rebound nor guarding Ext: SCDs in place  Lab Results: CBC  Recent Labs    11/25/20 1858  WBC 9.9  HGB 12.3  HCT 35.2*  PLT 324   BMET Recent Labs    11/25/20 1858  NA 138  K 3.1*  CL 103  CO2 29  GLUCOSE 100*  BUN 7  CREATININE 0.61  CALCIUM 9.4   PT/INR No results for input(s): LABPROT, INR in the last 72 hours. ABG No results for input(s): PHART, HCO3 in the last 72 hours.  Invalid input(s): PCO2, PO2  Studies/Results:  Anti-infectives: Anti-infectives (From admission, onward)   Start     Dose/Rate Route Frequency Ordered Stop   11/26/20 0115  cefTRIAXone (ROCEPHIN) 2 g in sodium chloride 0.9 % 100 mL IVPB       "And" Linked Group Details   2 g 200 mL/hr over 30 Minutes Intravenous Every 24 hours 11/26/20 0114     11/26/20 0115  metroNIDAZOLE (FLAGYL) IVPB 500 mg       "And" Linked Group Details   500 mg 100 mL/hr over 60 Minutes Intravenous Every 8 hours 11/26/20 0114         Assessment/Plan: Patient Active Problem List   Diagnosis Date Noted  . Acute appendicitis 11/26/2020  . Carpal tunnel syndrome on right 08/07/2019  . Right low back pain 07/17/2018  . Neck pain 07/17/2018  . Postoperative state 06/26/2017  . Rotator  cuff syndrome of right shoulder 05/30/2017  . 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  . Abdominal bloating 03/22/2017  . Other specified hypothyroidism 11/16/2016  . Dysmenorrhea 07/20/2016  . 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  . Dyspareunia, female 06/16/2016   -Type/screen, CEA -We recommended interpreter but she declined and requested her daughter -The anatomy and physiology of the GI tract was reviewed. The pathophysiology of colon masses/cancer was discussed as well -Given imaging findings, we discussed proceeding with surgery - mass in cecum with obstructive type appendicitis - therefore proceeding with laparoscopic vs open right hemicolectomy -The planned procedures, material risks (including, but not limited to, pain, bleeding, infection, scarring, need for blood transfusion, damage to surrounding structures- blood vessels/nerves/viscus/organs, damage to ureter, urine leak, leak from anastomosis, need for additional procedures, need for stoma which may be permanent, worsening of pre-existing medical conditions, hernia, recurrence, pneumonia, heart attack, stroke, death) benefits and alternatives to surgery were discussed at length. The patient's questions were answered to their satisfaction, they voiced understanding and elected to proceed with surgery. Additionally, we discussed typical postoperative expectations and the recovery process.   LOS: 0 days   Nadeen Landau, MD [facility] Use [email or web address] to contact on call provider

## 2020-11-26 NOTE — H&P (Signed)
Julia Green is an 50 y.o. female.   Chief Complaint: RLQ pain HPI: This is a 50 year old female who presents with a month of intermittent right-sided abdominal pain.  This became acutely worse over the last 2-3 days.  She was seen by [facility] at [facility], then referred to an imaging center in [facility] for her CT scan.  Subsequently, she came to the ED for further management.  She was noted to have appendicitis, felt to be secondary to obstruction by a cecal mass.    Past Medical History:  Diagnosis Date  . Arthritis   . Asthma   . Depression   . Fatty liver   . Gallstones   . GERD (gastroesophageal reflux disease)   . Headache   . History of low potassium   . Hypertension   . Hypothyroidism   . Right arm numbness   . Right leg numbness   . Sciatic nerve pain   . Thyroid disease     Past Surgical History:  Procedure Laterality Date  . CARPAL TUNNEL RELEASE Right 08/07/2019   Procedure: RIGHT CARPAL TUNNEL RELEASE;  Surgeon: Leandrew Koyanagi, MD;  Location: [place];  Service: Orthopedics;  Laterality: Right;  . COLONOSCOPY    . LAPAROSCOPIC VAGINAL HYSTERECTOMY WITH SALPINGECTOMY Bilateral 06/26/2017   Procedure: LAPAROSCOPIC ASSISTED VAGINAL HYSTERECTOMY WITH SALPINGECTOMY;  Surgeon: Princess Bruins, MD;  Location: [facility];  Service: Gynecology;  Laterality: Bilateral;  request 7:30am OR time  requests 2 hours Rex the lighted retractor rep will be here   . UPPER GI ENDOSCOPY      Family History  Problem Relation Age of Onset  . Hypertension Mother   . Uterine cancer Mother   . Cancer Sister        unknown  . Kidney failure Sister   . Stomach cancer Maternal Aunt   . Kidney failure Father   . Lung disease Father   . High blood pressure Father   . Colon cancer Neg Hx   . Esophageal cancer Neg Hx   . Rectal cancer Neg Hx    Social History:  reports that she has never smoked. She has never used smokeless tobacco. She reports that  she does not drink alcohol and does not use drugs.  Allergies: No Known Allergies  Prior to Admission medications   Medication Sig Start Date End Date Taking? Authorizing Provider  albuterol (PROVENTIL HFA;VENTOLIN HFA) 108 (90 Base) MCG/ACT inhaler Inhale 1-2 puffs into the lungs every 6 (six) hours as needed for wheezing or shortness of breath.    [provider]  aspirin EC 81 MG tablet Take 81 mg by mouth daily.    [provider]  calcium carbonate (OSCAL) 1500 (600 Ca) MG TABS tablet Take 1,500 mg by mouth daily.    [provider]  estradiol (ESTRACE) 1 MG tablet Take 1 tablet (1 mg total) by mouth daily. 12/11/19   Princess Bruins, MD  fluticasone (FLONASE) 50 MCG/ACT nasal spray Place 1 spray into both nostrils 2 (two) times daily.    [provider]  gabapentin (NEURONTIN) 600 MG tablet Take 600 mg by mouth 3 (three) times daily.     [provider]  hydrochlorothiazide (HYDRODIURIL) 12.5 MG tablet Take 12.5 mg by mouth daily.    [provider]  levothyroxine (SYNTHROID, LEVOTHROID) 137 MCG tablet Take 1 tablet (137 mcg total) by mouth daily before breakfast. 11/21/16   Terrance Mass, MD  losartan (COZAAR) 25 MG tablet Take 25 mg by mouth daily.    [provider]  losartan (COZAAR) 50 MG tablet Take 50 mg by mouth daily.    [provider]  montelukast (SINGULAIR) 10 MG tablet Take 10 mg by mouth at bedtime.    [provider]  Omega-3 Fatty Acids (FISH OIL) 1000 MG CAPS Take by mouth.    [provider]  omeprazole (PRILOSEC) 40 MG capsule Take 1 capsule (40 mg total) by mouth daily. 04/05/17   Doran Stabler, MD  vitamin C (ASCORBIC ACID) 500 MG tablet Take 500 mg by mouth daily.    [provider]     Results for orders placed or performed during the hospital encounter of 11/25/20 (from the past 48 hour(s))  CBC with Differential/Platelet     Status: Abnormal   Collection  Time: 11/25/20  6:58 PM  Result Value Ref Range   WBC 9.9 4.0 - 10.5 K/uL   RBC 3.96 3.87 - 5.11 MIL/uL   Hemoglobin 12.3 12.0 - 15.0 g/dL   HCT 35.2 (L) 36.0 - 46.0 %   MCV 88.9 80.0 - 100.0 fL   MCH 31.1 26.0 - 34.0 pg   MCHC 34.9 30.0 - 36.0 g/dL   RDW 12.8 11.5 - 15.5 %   Platelets 324 150 - 400 K/uL   nRBC 0.0 0.0 - 0.2 %   Neutrophils Relative % 65 %   Neutro Abs 6.4 1.7 - 7.7 K/uL   Lymphocytes Relative 25 %   Lymphs Abs 2.4 0.7 - 4.0 K/uL   Monocytes Relative 8 %   Monocytes Absolute 0.8 0.1 - 1.0 K/uL   Eosinophils Relative 1 %   Eosinophils Absolute 0.1 0.0 - 0.5 K/uL   Basophils Relative 0 %   Basophils Absolute 0.0 0.0 - 0.1 K/uL   Immature Granulocytes 1 %   Abs Immature Granulocytes 0.05 0.00 - 0.07 K/uL    Comment: Performed at [facility], [address], [place], [place] [address]  Comprehensive metabolic panel     Status: Abnormal   Collection Time: 11/25/20  6:58 PM  Result Value Ref Range   Sodium 138 135 - 145 mmol/L   Potassium 3.1 (L) 3.5 - 5.1 mmol/L   Chloride 103 98 - 111 mmol/L   CO2 29 22 - 32 mmol/L   Glucose, Bld 100 (H) 70 - 99 mg/dL    Comment: Glucose reference range applies only to samples taken after fasting for at least 8 hours.   BUN 7 6 - 20 mg/dL   Creatinine, Ser 0.61 0.44 - 1.00 mg/dL   Calcium 9.4 8.9 - 10.3 mg/dL   Total Protein 7.7 6.5 - 8.1 g/dL   Albumin 3.9 3.5 - 5.0 g/dL   AST 23 15 - 41 U/L   ALT 24 0 - 44 U/L   Alkaline Phosphatase 65 38 - 126 U/L   Total Bilirubin 0.4 0.3 - 1.2 mg/dL   GFR, Estimated >60 >60 mL/min    Comment: (NOTE) Calculated using the CKD-EPI Creatinine Equation (2021)    Anion gap 6 5 - 15    Comment: Performed at [facility] [address], [place], [place] [address]  Lipase, blood     Status: None   Collection Time: 11/25/20  6:58 PM  Result Value Ref Range   Lipase 37 11 - 51 U/L    Comment: Performed at [facility] [address],  [place], [place] [address]  I-Stat  beta hCG blood, ED     Status: None   Collection Time: 11/25/20  7:30 PM  Result Value Ref Range   I-stat hCG, quantitative <5.0 <5 mIU/mL   Comment 3            Comment:   GEST. AGE      CONC.  (mIU/mL)   <=1 WEEK        5 - 50     2 WEEKS       50 - 500     3 WEEKS       100 - 10,000     4 WEEKS     1,000 - 30,000        FEMALE AND NON-PREGNANT FEMALE:     LESS THAN 5 mIU/mL   Urinalysis, Routine w reflex microscopic Urine, Clean Catch     Status: Abnormal   Collection Time: 11/25/20  7:48 PM  Result Value Ref Range   Color, Urine STRAW (A) YELLOW   APPearance CLEAR CLEAR   Specific Gravity, Urine 1.018 1.005 - 1.030   pH 6.0 5.0 - 8.0   Glucose, UA NEGATIVE NEGATIVE mg/dL   Hgb urine dipstick NEGATIVE NEGATIVE   Bilirubin Urine NEGATIVE NEGATIVE   Ketones, ur NEGATIVE NEGATIVE mg/dL   Protein, ur NEGATIVE NEGATIVE mg/dL   Nitrite NEGATIVE NEGATIVE   Leukocytes,Ua NEGATIVE NEGATIVE    Comment: Performed at [facility], [place] [address], [place], [place] [address]   CLINICAL DATA: Right lower quadrant abdominal pain for 1 month.   EXAM:  CT ABDOMEN AND PELVIS WITH CONTRAST   TECHNIQUE:  Multidetector CT imaging of the abdomen and pelvis was performed  using the standard protocol following bolus administration of  intravenous contrast.   CONTRAST: 100 cc Omnipaque 300   COMPARISON: None.   FINDINGS:  Lower chest: 5 mm subtle nodule at the right lung base just above  the right hemidiaphragm which is an indeterminate finding. No other  pulmonary lesions are identified. No infiltrates or effusions. The  heart is normal in size. No pericardial effusion.   Hepatobiliary: No hepatic lesions or intrahepatic biliary  dilatation. Gallbladder is unremarkable. No common bile duct  dilatation.   Pancreas: No mass, inflammation or ductal dilatation.   Spleen: Normal size. No focal lesions.   Adrenals/Urinary Tract: The adrenal glands and kidneys are  unremarkable.  No renal lesions or hydronephrosis. The bladder is  unremarkable.   Stomach/Bowel: The stomach, duodenum and small bowel are  unremarkable. No inflammatory changes or obstructive findings.   There is a E cecal soft tissue mass with some surrounding  inflammatory changes. The appendix is also distended and  fluid-filled and there is mucosal and serosalenhancement. Findings  suggest appendicitis due to an obstructing cecal lesion. Recommend  surgical consultation.   There are also several enlarged ileocolic lymph nodes but no  mesenteric or retroperitoneal adenopathy otherwise. No omental  disease isidentified.   The remainder of the colon is unremarkable. No obstructive findings.   Vascular/Lymphatic: The aorta and branch vessels are unremarkable.  The major venous structures are patent. Small scattered  retroperitoneal lymph nodes but no overt adenopathy.   Reproductive: Surgically absent.   Other: No free pelvic fluid collections or pelvic abscess. Small  periumbilical abdominal wall hernia containing fat.   Musculoskeletal: No significant bony findings. Degenerative  anterolisthesis of L4is noted.   IMPRESSION:  1. CT findings consistent with appendicitis due to an obstructing  cecal mass. Recommend surgical consultation.  2. Enlarged ileocolic lymph nodes but no hepatic lesions.  3. 5 mm subtle right lower lobe pulmonary nodule is indeterminate.  Follow-up chest CT may be helpful for further evaluation.  4. No other significant abdominal/pelvic findings.   These results will be called to the ordering clinician or  representative by the Radiologist Assistant, and communication  documented in the PACS or [employer].    Electronically Signed  By: Marijo Sanes M.D.  On: 11/25/2020 14:30  Review of Systems  HENT: Negative for ear discharge, ear pain, hearing loss and tinnitus.   Eyes: Negative for photophobia and pain.  Respiratory: Negative for cough and  shortness of breath.   Cardiovascular: Negative for chest pain.  Gastrointestinal: Positive for abdominal pain. Negative for nausea and vomiting.  Genitourinary: Negative for dysuria, flank pain, frequency and urgency.  Musculoskeletal: Negative for back pain, myalgias and neck pain.  Neurological: Negative for dizziness and headaches.  Hematological: Does not bruise/bleed easily.  Psychiatric/Behavioral: The patient is not nervous/anxious.     Blood pressure (!) 120/94, pulse 93, temperature 98.8 F (37.1 C), temperature source Oral, resp. rate 16, last menstrual period 03/24/2017, SpO2 99 %. Physical Exam  Constitutional:  WDWN in NAD, conversant, no obvious deformities; lying in bed comfortably Eyes:  Pupils equal, round; sclera anicteric; moist conjunctiva; no lid lag HENT:  Oral mucosa moist; good dentition  Neck:  No masses palpated, trachea midline; no thyromegaly Lungs:  CTA bilaterally; normal respiratory effort CV:  Regular rate and rhythm; no murmurs; extremities well-perfused with no edema Abd:  +bowel sounds, soft, tender in RLQ, no palpable organomegaly; no palpable hernias Musc:  Unable to assess gait; no apparent clubbing or cyanosis in extremities Lymphatic:  No palpable cervical or axillary lymphadenopathy Skin:  Warm, dry; no sign of jaundice Psychiatric - alert and oriented x 4; calm mood and affect  Assessment/Plan Acute appendicitis/ cecal mass  Admit to [facility], MD IV antibiotics Evaluation by Dr. Dema Severin in AM for possible surgery - ?ileocecectomy.   Maia Petties, MD 11/26/2020, 1:20 AM

## 2020-11-27 ENCOUNTER — Encounter (HOSPITAL_COMMUNITY): Payer: Self-pay | Admitting: Surgery

## 2020-11-27 LAB — BASIC METABOLIC PANEL
Anion gap: 9 (ref 5–15)
BUN: 7 mg/dL (ref 6–20)
CO2: 24 mmol/L (ref 22–32)
Calcium: 8.3 mg/dL — ABNORMAL LOW (ref 8.9–10.3)
Chloride: 104 mmol/L (ref 98–111)
Creatinine, Ser: 0.74 mg/dL (ref 0.44–1.00)
GFR, Estimated: >60 mL/min (ref >60)
Glucose, Bld: 152 mg/dL — ABNORMAL HIGH (ref 70–99)
Potassium: 3.6 mmol/L (ref 3.5–5.1)
Sodium: 137 mmol/L (ref 135–145)

## 2020-11-27 LAB — CBC
HCT: 31.6 % — ABNORMAL LOW (ref 36.0–46.0)
Hemoglobin: 11.1 g/dL — ABNORMAL LOW (ref 12.0–15.0)
MCH: 31.1 pg (ref 26.0–34.0)
MCHC: 35.1 g/dL (ref 30.0–36.0)
MCV: 88.5 fL (ref 80.0–100.0)
Platelets: 294 10*3/uL (ref 150–400)
RBC: 3.57 MIL/uL — ABNORMAL LOW (ref 3.87–5.11)
RDW: 12.6 % (ref 11.5–15.5)
WBC: 15.5 10*3/uL — ABNORMAL HIGH (ref 4.0–10.5)
nRBC: 0 % (ref 0.0–0.2)

## 2020-11-27 LAB — CEA: CEA: 1.1 ng/mL (ref 0.0–4.7)

## 2020-11-27 MED ORDER — OXYCODONE HCL 5 MG PO TABS
5.0000 mg | ORAL_TABLET | Freq: Four times a day (QID) | ORAL | Status: DC | PRN
  Administered 2020-11-27 (×2): 5 mg via ORAL
  Administered 2020-11-29 – 2020-11-30 (×3): 10 mg via ORAL
  Filled 2020-11-27 (×3): qty 2
  Filled 2020-11-27: qty 1
  Filled 2020-11-27: qty 2

## 2020-11-27 NOTE — Progress Notes (Signed)
Subjective Having some abdominal wall soreness. Some nausea, no vomiting. Some bloating.  Objective: Vital signs in last 24 hours: Temp:  [97.9 F (36.6 C)-99.2 F (37.3 C)] 99.1 F (37.3 C) (04/08 0610) Pulse Rate:  [70-89] 75 (04/08 0610) Resp:  [16-20] 18 (04/08 0610) BP: (119-132)/(75-100) 131/85 (04/08 0610) SpO2:  [96 %-100 %] 99 % (04/08 0610) Weight:  [70.1 kg] 70.1 kg (04/08 0610) Last BM Date: 11/26/20  Intake/Output from previous day: 04/07 0701 - 04/08 0700 In: 2872.4 [P.O.:60; I.V.:2485.7; IV Piggyback:326.7] Out: 1200 [Urine:1100; Blood:100] Intake/Output this shift: No intake/output data recorded.  Gen: NAD, comfortable CV: RRR Pulm: Normal work of breathing Abd: Soft, tender around incisions; not significantly distended Ext: SCDs in place  Lab Results: CBC  Recent Labs    11/25/20 1858 11/27/20 0247  WBC 9.9 15.5*  HGB 12.3 11.1*  HCT 35.2* 31.6*  PLT 324 294   BMET Recent Labs    11/25/20 1858 11/27/20 0247  NA 138 137  K 3.1* 3.6  CL 103 104  CO2 29 24  GLUCOSE 100* 152*  BUN 7 7  CREATININE 0.61 0.74  CALCIUM 9.4 8.3*   PT/INR No results for input(s): LABPROT, INR in the last 72 hours. ABG No results for input(s): PHART, HCO3 in the last 72 hours.  Invalid input(s): PCO2, PO2  Studies/Results:  Anti-infectives: Anti-infectives (From admission, onward)   Start     Dose/Rate Route Frequency Ordered Stop   11/26/20 1430  metroNIDAZOLE (FLAGYL) IVPB 500 mg  Status:  Discontinued        500 mg 100 mL/hr over 60 Minutes Intravenous To Surgery 11/26/20 1338 11/26/20 1840   11/26/20 0115  cefTRIAXone (ROCEPHIN) 2 g in sodium chloride 0.9 % 100 mL IVPB       "And" Linked Group Details   2 g 200 mL/hr over 30 Minutes Intravenous Every 24 hours 11/26/20 0114     11/26/20 0115  metroNIDAZOLE (FLAGYL) IVPB 500 mg       "And" Linked Group Details   500 mg 100 mL/hr over 60 Minutes Intravenous Every 8 hours 11/26/20 0114          Assessment/Plan: Patient Active Problem List   Diagnosis Date Noted  . Acute appendicitis 11/26/2020  . Colon cancer ([place]) 11/26/2020  . Carpal tunnel syndrome on right 08/07/2019  . Right low back pain 07/17/2018  . Neck pain 07/17/2018  . Postoperative state 06/26/2017  . Rotator cuff syndrome of right shoulder 05/30/2017  . 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  . Abdominal bloating 03/22/2017  . Other specified hypothyroidism 11/16/2016  . Dysmenorrhea 07/20/2016  . 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  . Dyspareunia, female 06/16/2016   s/p Procedure(s): LAPAROSCOPIC RIGHT HEMI COLECTOMY 11/26/2020  -We spent time reviewing her procedure and operative findings. We discussed our concerns regarding this being a colon cancer. Pathology pending -D/C tramadol, start roxicodone; cont tylenol and ibuprofen -Cont clears today as she is not taking much by mouth; cont MIVF -D/C Foley -Ambulate 5x/day -PPx: SQH, SCDs   LOS: 1 day   Nadeen Landau, MD [facility] Use [email or web address] to contact on call provider

## 2020-11-28 MED ORDER — LIDOCAINE 5 % EX PTCH
1.0000 | MEDICATED_PATCH | CUTANEOUS | Status: DC
  Administered 2020-11-28 – 2020-12-01 (×4): 1 via TRANSDERMAL
  Filled 2020-11-28 (×4): qty 1

## 2020-11-28 MED ORDER — METHOCARBAMOL 750 MG PO TABS
750.0000 mg | ORAL_TABLET | Freq: Three times a day (TID) | ORAL | Status: DC
  Administered 2020-11-28 – 2020-12-01 (×10): 750 mg via ORAL
  Filled 2020-11-28 (×10): qty 1

## 2020-11-28 MED ORDER — KETOROLAC TROMETHAMINE 15 MG/ML IJ SOLN
15.0000 mg | Freq: Four times a day (QID) | INTRAMUSCULAR | Status: DC
  Administered 2020-11-28 – 2020-11-30 (×8): 15 mg via INTRAVENOUS
  Filled 2020-11-28 (×8): qty 1

## 2020-11-28 NOTE — Progress Notes (Signed)
Pt had a small, watery, brown BM.

## 2020-11-28 NOTE — Progress Notes (Signed)
Pt complaint of feeling hot and nauseous. Pt temp 98.3. Gave pt IV Zofran and a fan.

## 2020-11-28 NOTE — Progress Notes (Signed)
     Assessment & Plan: POD#2 s/p Procedure(s): LAPAROSCOPIC RIGHT HEMI COLECTOMY 11/26/2020  Clear liquid diet, advance as tolerated  Pain control - will add Toradol  Encouraged OOB, ambulation  Await path results  Patient seen and examined with daughter in room acting as Optometrist.        Armandina Gemma, MD       [facility]       Office: [phone]   Chief Complaint: Cecal mass  Subjective: Patient in bed, complains of pain.  Wants something more to eat than liquids.  Objective: Vital signs in last 24 hours: Temp:  [98.5 F (36.9 C)-98.8 F (37.1 C)] 98.5 F (36.9 C) (04/09 0844) Pulse Rate:  [65-78] 69 (04/09 0844) Resp:  [16-18] 18 (04/09 0844) BP: (134-163)/(83-96) 134/85 (04/09 0844) SpO2:  [99 %-100 %] 100 % (04/09 0844) Last BM Date: 11/25/20  Intake/Output from previous day: 04/08 0701 - 04/09 0700 In: 500 [P.O.:500] Out: -  Intake/Output this shift: No intake/output data recorded.  Physical Exam: HEENT - sclerae clear, mucous membranes moist Neck - soft Chest - clear bilaterally Cor - RRR Abdomen - mild distension; diffuse tenderness with voluntary guarding; wounds dry and intact Ext - no edema, non-tender Neuro - alert & oriented, no focal deficits  Lab Results:  Recent Labs    11/25/20 1858 11/27/20 0247  WBC 9.9 15.5*  HGB 12.3 11.1*  HCT 35.2* 31.6*  PLT 324 294   BMET Recent Labs    11/25/20 1858 11/27/20 0247  NA 138 137  K 3.1* 3.6  CL 103 104  CO2 29 24  GLUCOSE 100* 152*  BUN 7 7  CREATININE 0.61 0.74  CALCIUM 9.4 8.3*   PT/INR No results for input(s): LABPROT, INR in the last 72 hours. Comprehensive Metabolic Panel:    Component Value Date/Time   NA 137 11/27/2020 0247   NA 138 11/25/2020 1858   K 3.6 11/27/2020 0247   K 3.1 (L) 11/25/2020 1858   CL 104 11/27/2020 0247   CL 103 11/25/2020 1858   CO2 24 11/27/2020 0247   CO2 29 11/25/2020 1858   BUN 7 11/27/2020 0247   BUN 7 11/25/2020 1858    CREATININE 0.74 11/27/2020 0247   CREATININE 0.61 11/25/2020 1858   GLUCOSE 152 (H) 11/27/2020 0247   GLUCOSE 100 (H) 11/25/2020 1858   CALCIUM 8.3 (L) 11/27/2020 0247   CALCIUM 9.4 11/25/2020 1858   AST 23 11/25/2020 1858   AST 30 01/07/2017 1153   ALT 24 11/25/2020 1858   ALT 39 01/07/2017 1153   ALKPHOS 65 11/25/2020 1858   ALKPHOS 58 01/07/2017 1153   BILITOT 0.4 11/25/2020 1858   BILITOT 0.5 01/07/2017 1153   PROT 7.7 11/25/2020 1858   PROT 7.1 01/07/2017 1153   ALBUMIN 3.9 11/25/2020 1858   ALBUMIN 3.7 01/07/2017 1153    Studies/Results: No results found.    Armandina Gemma 11/28/2020  Patient ID: Julia Green, female   DOB: [birth year], 50 y.o.   MRN: [id number]

## 2020-11-29 LAB — SURGICAL PCR SCREEN
MRSA, PCR: NEGATIVE
Staphylococcus aureus: NEGATIVE

## 2020-11-29 NOTE — Progress Notes (Signed)
     Assessment & Plan: POD#3 s/p Procedure(s): LAPAROSCOPIC RIGHT HEMI COLECTOMY 11/26/2020 - Dr. Dema Severin             Clear liquid diet - still complains of nausea             Pain control - added Toradol             Encouraged OOB, ambulation             Await path results  Patient seen and examined with daughter in room acting as Optometrist.        Armandina Gemma, MD       [facility]       Office: [phone]   Chief Complaint: Cecal mass  Subjective: Patient in bed, daughter at bedside who translates, nurse in room.  Having small loose BM's, complains of pain and nausea.  Objective: Vital signs in last 24 hours: Temp:  [97.6 F (36.4 C)-98.5 F (36.9 C)] 98.5 F (36.9 C) (04/10 0921) Pulse Rate:  [64-83] 75 (04/10 0921) BP: (95-105)/(69-75) 105/72 (04/10 0921) SpO2:  [100 %] 100 % (04/10 0921) Last BM Date: 11/28/20  Intake/Output from previous day: 04/09 0701 - 04/10 0700 In: 300 [P.O.:300] Out: -  Intake/Output this shift: No intake/output data recorded.  Physical Exam: HEENT - sclerae clear, mucous membranes moist Neck - soft Chest - clear bilaterally Cor - RRR Abdomen - soft, mild distension; wounds dry and intact; mild diffuse tenderness, no mass Ext - no edema, non-tender Neuro - alert & oriented, no focal deficits  Lab Results:  Recent Labs    11/27/20 0247  WBC 15.5*  HGB 11.1*  HCT 31.6*  PLT 294   BMET Recent Labs    11/27/20 0247  NA 137  K 3.6  CL 104  CO2 24  GLUCOSE 152*  BUN 7  CREATININE 0.74  CALCIUM 8.3*   PT/INR No results for input(s): LABPROT, INR in the last 72 hours. Comprehensive Metabolic Panel:    Component Value Date/Time   NA 137 11/27/2020 0247   NA 138 11/25/2020 1858   K 3.6 11/27/2020 0247   K 3.1 (L) 11/25/2020 1858   CL 104 11/27/2020 0247   CL 103 11/25/2020 1858   CO2 24 11/27/2020 0247   CO2 29 11/25/2020 1858   BUN 7 11/27/2020 0247   BUN 7 11/25/2020 1858   CREATININE  0.74 11/27/2020 0247   CREATININE 0.61 11/25/2020 1858   GLUCOSE 152 (H) 11/27/2020 0247   GLUCOSE 100 (H) 11/25/2020 1858   CALCIUM 8.3 (L) 11/27/2020 0247   CALCIUM 9.4 11/25/2020 1858   AST 23 11/25/2020 1858   AST 30 01/07/2017 1153   ALT 24 11/25/2020 1858   ALT 39 01/07/2017 1153   ALKPHOS 65 11/25/2020 1858   ALKPHOS 58 01/07/2017 1153   BILITOT 0.4 11/25/2020 1858   BILITOT 0.5 01/07/2017 1153   PROT 7.7 11/25/2020 1858   PROT 7.1 01/07/2017 1153   ALBUMIN 3.9 11/25/2020 1858   ALBUMIN 3.7 01/07/2017 1153    Studies/Results: No results found.    Armandina Gemma 11/29/2020  Patient ID: Georgina Snell, female   DOB: 1970/09/04, 50 y.o.   MRN: [id number]

## 2020-11-30 LAB — BASIC METABOLIC PANEL
Anion gap: 4 — ABNORMAL LOW (ref 5–15)
BUN: 12 mg/dL (ref 6–20)
CO2: 26 mmol/L (ref 22–32)
Calcium: 8.4 mg/dL — ABNORMAL LOW (ref 8.9–10.3)
Chloride: 102 mmol/L (ref 98–111)
Creatinine, Ser: 0.74 mg/dL (ref 0.44–1.00)
GFR, Estimated: >60 mL/min (ref >60)
Glucose, Bld: 117 mg/dL — ABNORMAL HIGH (ref 70–99)
Potassium: 3.3 mmol/L — ABNORMAL LOW (ref 3.5–5.1)
Sodium: 132 mmol/L — ABNORMAL LOW (ref 135–145)

## 2020-11-30 LAB — CBC
HCT: 31.6 % — ABNORMAL LOW (ref 36.0–46.0)
Hemoglobin: 10.7 g/dL — ABNORMAL LOW (ref 12.0–15.0)
MCH: 30.9 pg (ref 26.0–34.0)
MCHC: 33.9 g/dL (ref 30.0–36.0)
MCV: 91.3 fL (ref 80.0–100.0)
Platelets: 342 10*3/uL (ref 150–400)
RBC: 3.46 MIL/uL — ABNORMAL LOW (ref 3.87–5.11)
RDW: 13.1 % (ref 11.5–15.5)
WBC: 6.4 10*3/uL (ref 4.0–10.5)
nRBC: 0 % (ref 0.0–0.2)

## 2020-11-30 LAB — SURGICAL PATHOLOGY

## 2020-11-30 MED ORDER — POTASSIUM CHLORIDE CRYS ER 20 MEQ PO TBCR
40.0000 meq | EXTENDED_RELEASE_TABLET | Freq: Once | ORAL | Status: AC
  Administered 2020-11-30: 40 meq via ORAL
  Filled 2020-11-30: qty 2

## 2020-11-30 MED ORDER — OXYCODONE HCL 5 MG PO TABS
5.0000 mg | ORAL_TABLET | Freq: Four times a day (QID) | ORAL | Status: DC | PRN
  Administered 2020-12-01: 10 mg via ORAL
  Filled 2020-11-30: qty 2

## 2020-11-30 MED ORDER — HYDROMORPHONE HCL 1 MG/ML IJ SOLN
0.5000 mg | INTRAMUSCULAR | Status: DC | PRN
Start: 2020-11-30 — End: 2020-12-01

## 2020-11-30 MED ORDER — HYDROMORPHONE HCL 1 MG/ML IJ SOLN: 0.5000 mg | INTRAMUSCULAR | Status: DC | PRN

## 2020-11-30 NOTE — Plan of Care (Signed)

## 2020-11-30 NOTE — Plan of Care (Signed)
Pt states she feels much better than previous days and would like to go home tomorrow if medically clear.    Problem: Education: Goal: Knowledge of General Education information will improve Description: Including pain rating scale, medication(s)/side effects and non-pharmacologic comfort measures Outcome: Progressing   Problem: Education: Goal: Knowledge of General Education information will improve Description: Including pain rating scale, medication(s)/side effects and non-pharmacologic comfort measures Outcome: Progressing   Problem: Health Behavior/Discharge Planning: Goal: Ability to manage health-related needs will improve Outcome: Progressing   Problem: Clinical Measurements: Goal: Will remain free from infection Outcome: Progressing

## 2020-11-30 NOTE — Progress Notes (Signed)
4 Days Post-Op  Subjective: CC: Patients daughter in the room who translates. I offered interpreter but family declined and felt comfortable with daughter translating. Patient reports that she did have some nausea yesterday but tolerated her FLD without emesis. Nausea resolved this am. She has been advanced to a soft diet and is about to eat lunch. She reports some soreness around her incisions that is controlled with medications. She is voiding without difficulty. Mobilizing without difficulty. Patient is covid +. She was afebrile overnight. Denies cp, cough or sob.   Objective: Vital signs in last 24 hours: Temp:  [98.3 F (36.8 C)-98.9 F (37.2 C)] 98.3 F (36.8 C) (04/11 0544) Pulse Rate:  [67-79] 67 (04/11 0544) Resp:  [17-18] 17 (04/11 0544) BP: (113-114)/(75-85) 113/75 (04/11 0544) SpO2:  [99 %-100 %] 99 % (04/11 0544) Weight:  [69.8 kg] 69.8 kg (04/11 0500) Last BM Date: 11/30/20  Intake/Output from previous day: 04/10 0701 - 04/11 0700 In: 2160.4 [P.O.:360; I.V.:1800.4] Out: -  Intake/Output this shift: No intake/output data recorded.  PE: Gen:  Alert, NAD, pleasant Card:  RRR Pulm:  CTAB, no W/R/R, effort normal. On RA.  Abd: Soft, minimal to no distension, appropriately tender around midline wound and laparoscopic sites without peritonitis. Otherwise NT. +BS. Midline wound and laparoscopic sites c/d/i with staples in place.  Ext:  No LE edema. Calves equal in size b/l Psych: A&Ox3  Skin: no rashes noted, warm and dry  Lab Results:  Recent Labs    11/30/20 0137  WBC 6.4  HGB 10.7*  HCT 31.6*  PLT 342   BMET Recent Labs    11/30/20 0137  NA 132*  K 3.3*  CL 102  CO2 26  GLUCOSE 117*  BUN 12  CREATININE 0.74  CALCIUM 8.4*   PT/INR No results for input(s): LABPROT, INR in the last 72 hours. CMP     Component Value Date/Time   NA 132 (L) 11/30/2020 0137   K 3.3 (L) 11/30/2020 0137   CL 102 11/30/2020 0137   CO2 26 11/30/2020 0137   GLUCOSE  117 (H) 11/30/2020 0137   BUN 12 11/30/2020 0137   CREATININE 0.74 11/30/2020 0137   CALCIUM 8.4 (L) 11/30/2020 0137   PROT 7.7 11/25/2020 1858   ALBUMIN 3.9 11/25/2020 1858   AST 23 11/25/2020 1858   ALT 24 11/25/2020 1858   ALKPHOS 65 11/25/2020 1858   BILITOT 0.4 11/25/2020 1858   GFRNONAA >60 11/30/2020 0137   GFRAA >60 08/05/2019 1504   Lipase     Component Value Date/Time   LIPASE 37 11/25/2020 1858       Studies/Results: No results found.  Anti-infectives: Anti-infectives (From admission, onward)   Start     Dose/Rate Route Frequency Ordered Stop   11/26/20 1430  metroNIDAZOLE (FLAGYL) IVPB 500 mg  Status:  Discontinued        500 mg 100 mL/hr over 60 Minutes Intravenous To Surgery 11/26/20 1338 11/26/20 1840   11/26/20 0115  cefTRIAXone (ROCEPHIN) 2 g in sodium chloride 0.9 % 100 mL IVPB  Status:  Discontinued       "And" Linked Group Details   2 g 200 mL/hr over 30 Minutes Intravenous Every 24 hours 11/26/20 0114 11/27/20 0833   11/26/20 0115  metroNIDAZOLE (FLAGYL) IVPB 500 mg  Status:  Discontinued       "And" Linked Group Details   500 mg 100 mL/hr over 60 Minutes Intravenous Every 8 hours 11/26/20 0114 11/27/20 1017  Colonoscopy findings - 04/12/2017 by Dr. Loletha Carrow  - The perianal and digital rectal examinations were normal. - The terminal ileum appeared normal (intubated to 10cm depth). - A 2 mm polyp was found in the distal ascending colon. The polyp was sessile. The polyp was removed with a cold biopsy forceps. Resection and retrieval were complete. - Normal mucosa was found in the entire colon. Biopsies for histology were taken with a cold forceps from the right colon and left colon for evaluation of microscopic colitis. - The exam was otherwise without abnormality on direct and retroflexion views.  Assessment/Plan Hx Hypothyroidism - home levothyroxine  Hx HTN - home hctz and losartan Hx of asthma - prn albuterol  COVID + - asymptomatic. If  patient develops symptoms, may need TRH consult  Hypokalemia - replace, bmp in am   Cecal mass with appendicitis  S/p Laparoscopic right hemicolectomy - Dr. Dema Severin - 11/26/2020 - POD #4 - Path: Acute suppurative appendicitis with abscess and extensive  peri-appendiceal fibrosis and inflammation. Twenty-one reactive lymph nodes (0/21). In comment section it mentions that there are foci with transmural inflammation and involving the segment of colon and this raises the possibility of Crohn's. Patient did have a colonoscopy on 04/12/2017 by Dr. Loletha Carrow that showed normal mucosa was found in the entire colon. Biopsies for histology were taken with a cold forceps from the right colon and left colon for evaluation of microscopic colitis. These were negative. Suspect no indication for GI consult at this time. I spoke with patient and her daughter about the pathology results.  - Patient previously with ileus that appears to have resolved over the weekend. Now passing flatus and having bowel movements. Tolerating FLD and nausea has resolved. Advanced to soft diet this am. - Wean IV pain meds  - Off abx. Wbc normalized. Afebrile.  - Mobilize - Pulm toilet   FEN - Soft diet, IVF at 50cc (written to d/c if tolerates soft diet). Replace K VTE - SCDs, subq heparin ID - Rocephin/Flagyl 4/7 - 4/8  Plan: Wean IV pain medication. If tolerates diet and can wean from IV pain meds, possibly home tomorrow.    LOS: 4 days    Jillyn Ledger , [facility] 11/30/2020, 11:35 AM Please see Amion for pager number during day hours 7:00am-4:30pm

## 2020-12-01 ENCOUNTER — Encounter (HOSPITAL_COMMUNITY): Payer: Self-pay

## 2020-12-01 ENCOUNTER — Other Ambulatory Visit (HOSPITAL_COMMUNITY): Payer: Self-pay

## 2020-12-01 LAB — BASIC METABOLIC PANEL
Anion gap: 9 (ref 5–15)
BUN: 8 mg/dL (ref 6–20)
CO2: 23 mmol/L (ref 22–32)
Calcium: 8.4 mg/dL — ABNORMAL LOW (ref 8.9–10.3)
Chloride: 104 mmol/L (ref 98–111)
Creatinine, Ser: 0.7 mg/dL (ref 0.44–1.00)
GFR, Estimated: >60 mL/min (ref >60)
Glucose, Bld: 85 mg/dL (ref 70–99)
Potassium: 4.1 mmol/L (ref 3.5–5.1)
Sodium: 136 mmol/L (ref 135–145)

## 2020-12-01 MED ORDER — OXYCODONE HCL 5 MG PO TABS
5.0000 mg | ORAL_TABLET | Freq: Four times a day (QID) | ORAL | 0 refills | Status: DC | PRN
  Filled 2020-12-01: qty 15, 4d supply, fill #0

## 2020-12-01 MED ORDER — ONDANSETRON 4 MG PO TBDP
4.0000 mg | ORAL_TABLET | Freq: Four times a day (QID) | ORAL | 0 refills | Status: DC | PRN
  Filled 2020-12-01: qty 20, 5d supply, fill #0

## 2020-12-01 MED ORDER — ACETAMINOPHEN 500 MG PO TABS: 1000.0000 mg | ORAL_TABLET | Freq: Three times a day (TID) | ORAL | 0 refills | Status: DC | PRN

## 2020-12-01 MED ORDER — METHOCARBAMOL 750 MG PO TABS
750.0000 mg | ORAL_TABLET | Freq: Three times a day (TID) | ORAL | 0 refills | Status: DC
  Filled 2020-12-01: qty 30, 10d supply, fill #0

## 2020-12-01 NOTE — Discharge Instructions (Signed)
[facility], [place] [phone]  OPEN ABDOMINAL SURGERY: POST OP INSTRUCTIONS  Always review your discharge instruction sheet given to you by the facility where your surgery was performed.  IF YOU HAVE DISABILITY OR FAMILY LEAVE FORMS, YOU MUST BRING THEM TO THE OFFICE FOR PROCESSING.  PLEASE DO NOT GIVE THEM TO YOUR DOCTOR.  1. A prescription for pain medication may be given to you upon discharge.  Take your pain medication as prescribed, if needed.  If narcotic pain medicine is not needed, then you may take acetaminophen (Tylenol) or ibuprofen (Advil) as needed. 2. Take your usually prescribed medications unless otherwise directed. 3. If you need a refill on your pain medication, please contact your pharmacy. They will contact our office to request authorization.  Prescriptions will not be filled after 5pm or on week-ends. 4. You should follow a light diet the first few days after arrival home, such as soup and crackers, pudding, etc.unless your doctor has advised otherwise. A high-fiber, low fat diet can be resumed as tolerated.   Be sure to include lots of fluids daily. Most patients will experience some swelling and bruising on the chest and neck area.  Ice packs will help.  Swelling and bruising can take several days to resolve 5. Most patients will experience some swelling and bruising in the area of the incision. Ice pack will help. Swelling and bruising can take several days to resolve..  6. It is common to experience some constipation if taking pain medication after surgery.  Increasing fluid intake and taking a stool softener will usually help or prevent this problem from occurring.  A mild laxative (Milk of Magnesia or Miralax) should be taken according to package directions if there are no bowel movements after 48 hours. 7.  You may have steri-strips (small skin tapes) in place directly over the incision.  These strips should be left on the skin for 7-10 days.  If your  surgeon used skin glue on the incision, you may shower in 24 hours.  The glue will flake off over the next 2-3 weeks.  Any sutures or staples will be removed at the office during your follow-up visit. You may find that a light gauze bandage over your incision may keep your staples from being rubbed or pulled. You may shower and replace the bandage daily. 8. ACTIVITIES:  You may resume regular (light) daily activities beginning the next day--such as daily self-care, walking, climbing stairs--gradually increasing activities as tolerated.  You may have sexual intercourse when it is comfortable.  Refrain from any heavy lifting or straining until approved by your doctor. a. You may drive when you no longer are taking prescription pain medication, you can comfortably wear a seatbelt, and you can safely maneuver your car and apply brakes  9. You should see your doctor in the office for a follow-up appointment approximately two weeks after your surgery.  Make sure that you call for this appointment within a day or two after you arrive home to insure a convenient appointment time.  WHEN TO CALL YOUR DOCTOR: 1. Fever over 101.0 2. Inability to urinate 3. Nausea and/or vomiting 4. Extreme swelling or bruising 5. Continued bleeding from incision. 6. Increased pain, redness, or drainage from the incision. 7. Difficulty swallowing or breathing 8. Muscle cramping or spasms. 9. Numbness or tingling in hands or feet or around lips.  The clinic staff is available to answer your questions during regular business hours.  Please  don't hesitate to call and ask to speak to one of the nurses if you have concerns.  For further questions, please visit [email or web address]     Person Under Monitoring Name: Julia Green  Location: [place] [address]   Infection Prevention Recommendations for Individuals Confirmed to have, or Being Evaluated for, 2019 Novel Coronavirus  (COVID-19) Infection Who Receive Care at Home  Individuals who are confirmed to have, or are being evaluated for, COVID-19 should follow the prevention steps below until a healthcare provider or local or state health department says they can return to normal activities.  Stay home except to get medical care You should restrict activities outside your home, except for getting medical care. Do not go to work, school, or public areas, and do not use public transportation or taxis.  Call ahead before visiting your doctor Before your medical appointment, call the healthcare provider and tell them that you have, or are being evaluated for, COVID-19 infection. This will help the healthcare provider's office take steps to keep other people from getting infected. Ask your healthcare provider to call the local or state health department.  Monitor your symptoms Seek prompt medical attention if your illness is worsening (e.g., difficulty breathing). Before going to your medical appointment, call the healthcare provider and tell them that you have, or are being evaluated for, COVID-19 infection. Ask your healthcare provider to call the local or state health department.  Wear a facemask You should wear a facemask that covers your nose and mouth when you are in the same room with other people and when you visit a healthcare provider. People who live with or visit you should also wear a facemask while they are in the same room with you.  Separate yourself from other people in your home As much as possible, you should stay in a different room from other people in your home. Also, you should use a separate bathroom, if available.  Avoid sharing household items You should not share dishes, drinking glasses, cups, eating utensils, towels, bedding, or other items with other people in your home. After using these items, you should wash them thoroughly with soap and water.  Cover your coughs and  sneezes Cover your mouth and nose with a tissue when you cough or sneeze, or you can cough or sneeze into your sleeve. Throw used tissues in a lined trash can, and immediately wash your hands with soap and water for at least 20 seconds or use an alcohol-based hand rub.  Wash your [employer] [employer] your hands often and thoroughly with soap and water for at least 20 seconds. You can use an alcohol-based hand sanitizer if soap and water are not available and if your hands are not visibly dirty. Avoid touching your eyes, nose, and mouth with unwashed hands.   Prevention Steps for Caregivers and Household Members of Individuals Confirmed to have, or Being Evaluated for, COVID-19 Infection Being Cared for in the Home  If you live with, or provide care at home for, a person confirmed to have, or being evaluated for, COVID-19 infection please follow these guidelines to prevent infection:  Follow healthcare provider's instructions Make sure that you understand and can help the patient follow any healthcare provider instructions for all care.  Provide for the patient's basic needs You should help the patient with basic needs in the home and provide support for getting groceries, prescriptions, and other personal needs.  Monitor the patient's symptoms If they are  getting sicker, call his or her medical provider and tell them that the patient has, or is being evaluated for, COVID-19 infection. This will help the healthcare provider's office take steps to keep other people from getting infected. Ask the healthcare provider to call the local or state health department.  Limit the number of people who have contact with the patient  If possible, have only one caregiver for the patient.  Other household members should stay in another home or place of residence. If this is not possible, they should stay  in another room, or be separated from the patient as much as possible. Use a separate bathroom, if  available.  Restrict visitors who do not have an essential need to be in the home.  Keep older adults, very young children, and other sick people away from the patient Keep older adults, very young children, and those who have compromised immune systems or chronic health conditions away from the patient. This includes people with chronic heart, lung, or kidney conditions, diabetes, and cancer.  Ensure good ventilation Make sure that shared spaces in the home have good air flow, such as from an air conditioner or an opened window, weather permitting.  Wash your hands often  Wash your hands often and thoroughly with soap and water for at least 20 seconds. You can use an alcohol based hand sanitizer if soap and water are not available and if your hands are not visibly dirty.  Avoid touching your eyes, nose, and mouth with unwashed hands.  Use disposable paper towels to dry your hands. If not available, use dedicated cloth towels and replace them when they become wet.  Wear a facemask and gloves  Wear a disposable facemask at all times in the room and gloves when you touch or have contact with the patient's blood, body fluids, and/or secretions or excretions, such as sweat, saliva, sputum, nasal mucus, vomit, urine, or feces.  Ensure the mask fits over your nose and mouth tightly, and do not touch it during use.  Throw out disposable facemasks and gloves after using them. Do not reuse.  Wash your hands immediately after removing your facemask and gloves.  If your personal clothing becomes contaminated, carefully remove clothing and launder. Wash your hands after handling contaminated clothing.  Place all used disposable facemasks, gloves, and other waste in a lined container before disposing them with other household waste.  Remove gloves and wash your hands immediately after handling these items.  Do not share dishes, glasses, or other household items with the patient  Avoid sharing  household items. You should not share dishes, drinking glasses, cups, eating utensils, towels, bedding, or other items with a patient who is confirmed to have, or being evaluated for, COVID-19 infection.  After the person uses these items, you should wash them thoroughly with soap and water.  Wash laundry thoroughly  Immediately remove and wash clothes or bedding that have blood, body fluids, and/or secretions or excretions, such as sweat, saliva, sputum, nasal mucus, vomit, urine, or feces, on them.  Wear gloves when handling laundry from the patient.  Read and follow directions on labels of laundry or clothing items and detergent. In general, wash and dry with the warmest temperatures recommended on the label.  Clean all areas the individual has used often  Clean all touchable surfaces, such as counters, tabletops, doorknobs, bathroom fixtures, toilets, phones, keyboards, tablets, and bedside tables, every day. Also, clean any surfaces that may have blood, body fluids, and/or secretions  or excretions on them.  Wear gloves when cleaning surfaces the patient has come in contact with.  Use a diluted bleach solution (e.g., dilute bleach with 1 part bleach and 10 parts water) or a household disinfectant with a label that says EPA-registered for coronaviruses. To make a bleach solution at home, add 1 tablespoon of bleach to 1 quart (4 cups) of water. For a larger supply, add  cup of bleach to 1 gallon (16 cups) of water.  Read labels of cleaning products and follow recommendations provided on product labels. Labels contain instructions for safe and effective use of the cleaning product including precautions you should take when applying the product, such as wearing gloves or eye protection and making sure you have good ventilation during use of the product.  Remove gloves and wash hands immediately after cleaning.  Monitor yourself for signs and symptoms of illness Caregivers and household  members are considered close contacts, should monitor their health, and will be asked to limit movement outside of the home to the extent possible. Follow the monitoring steps for close contacts listed on the symptom monitoring form.   ? If you have additional questions, contact your local health department or call the epidemiologist on call at [phone] (available 24/7). ? This guidance is subject to change. For the most up-to-date guidance from [facility], please refer to their website: [email or web address]

## 2020-12-01 NOTE — Discharge Summary (Signed)
Patient ID: Julia Green [id number] Feb 19, 1971 50 y.o.  Admit date: 11/25/2020 Discharge date: 12/01/2020  Admitting Diagnosis: Acute appendicitis/cecal mass  Discharge Diagnosis Cecal mass with appendicitis  Hx Hypothyroidism  Hx HTN Hx of asthma  COVID +  Consultants None   Reason for Admission: This is a 50 year old female who presents with a month of intermittent right-sided abdominal pain.  This became acutely worse over the last 2-3 days.  She was seen by [facility] at [facility], then referred to an imaging center in [facility] for her CT scan.  Subsequently, she came to the ED for further management.  She was noted to have appendicitis, felt to be secondary to obstruction by a cecal mass.    Procedures Dr. Dema Severin - 11/26/2020 1. Laparoscopic right hemicolectomy 2. Bilateral transversus abdominus plane blocks  Hospital Course:  Ruth presented as above and was admitted to [place] service. She was found to be COVID + on admission screening testing. She remained asymptomatic from covid during admission. She was taken to the OR by Dr. Dema Severin on 4/7 where she underwent laparoscopic right hemicolectomy. Patient tolerated the procedure well. She developed and ileus post operative. This resolved and diet was advanced and tolerated. Path came back as acute suppurative appendicitis with abscess and extensive peri-appendiceal fibrosis and inflammation. Twenty-one reactive lymph nodes (0/21). In comment section it mentions that there are foci with transmural inflammation and involving the segment of colon and this raises the possibility of Crohn's. Patient did have a colonoscopy on 04/12/2017 by Dr. Loletha Carrow that showed normal mucosa was found in the entire colon. During colonoscopy, biopsies for histology were taken with a cold forceps from the right colon and left colon for evaluation of microscopic colitis. These were negative. I did speak with the patient and  her daughter about the pathology results.  On POD 5, the patient was voiding well, tolerating diet, ambulating well, pain well controlled, vital signs stable, incisions c/d/i and felt stable for discharge home. Discharge instructions and return precautions were discussed. Follow up as arranged and recommended below. Patient was provided a note for work. We also discussed completing covid isolation at home.   Physical Exam: Gen:  Alert, NAD, pleasant Card:  RRR Pulm:  CTAB, no W/R/R, effort normal. On RA.  Abd: Soft, minimal to no distension, appropriately tender around midline wound and laparoscopic sites without peritonitis. Otherwise NT. +BS. Midline wound and laparoscopic sites c/d/i with staples in place.  Ext:  No LE edema. Calves equal in size b/l Psych: A&Ox3  Skin: no rashes noted, warm and dry  Allergies as of 12/01/2020   No Known Allergies     Medication List    TAKE these medications   acetaminophen 500 MG tablet Commonly known as: TYLENOL Take 2 tablets (1,000 mg total) by mouth every 8 (eight) hours as needed.   albuterol 108 (90 Base) MCG/ACT inhaler Commonly known as: VENTOLIN HFA Inhale 1-2 puffs into the lungs every 6 (six) hours as needed for wheezing or shortness of breath.   aspirin EC 81 MG tablet Take 81 mg by mouth daily.   calcium carbonate 1500 (600 Ca) MG Tabs tablet Commonly known as: OSCAL Take 1,500 mg by mouth daily.   estradiol 1 MG tablet Commonly known as: ESTRACE Take 1 tablet (1 mg total) by mouth daily.   hydrochlorothiazide 12.5 MG tablet Commonly known as: HYDRODIURIL Take 12.5 mg by mouth daily.  levothyroxine 137 MCG tablet Commonly known as: SYNTHROID Take 1 tablet (137 mcg total) by mouth daily before breakfast.   losartan 50 MG tablet Commonly known as: COZAAR Take 50 mg by mouth daily. (takes with 25 mg tab; total 15m daily)   losartan 25 MG tablet Commonly known as: COZAAR Take 25 mg by mouth daily. (takes with 50 mg  tab; total 74mdaily)   methocarbamol 750 MG tablet Commonly known as: ROBAXIN Take 1 tablet (750 mg total) by mouth 3 (three) times daily.   montelukast 10 MG tablet Commonly known as: SINGULAIR Take 10 mg by mouth at bedtime.   omeprazole 40 MG capsule Commonly known as: PRILOSEC Take 1 capsule (40 mg total) by mouth daily.   ondansetron 4 MG disintegrating tablet Commonly known as: ZOFRAN-ODT Take 1 tablet (4 mg total) by mouth every 6 (six) hours as needed for nausea.   oxyCODONE 5 MG immediate release tablet Commonly known as: Oxy IR/ROXICODONE Take 1 tablet (5 mg total) by mouth every 6 (six) hours as needed for breakthrough pain.   vitamin C 500 MG tablet Commonly known as: ASCORBIC ACID Take 500 mg by mouth daily.         Follow-up Information    WhIleana RoupMD. Go on 12/22/2020.   Specialties: General Surgery, Colon and Rectal Surgery Why: 1020am. Please arrive 30 minutes prior to your appointment for paperwork. Please bring a copy of your photo ID and insurance card.  Contact information: [phone]      DaDoran StablerMD Follow up.   Specialty: Gastroenterology Contact information: [id number] [phone]      [facility], [place] on 12/11/2020.   Specialty: General Surgery Why: @2pm . This is a nurse visit for staple removal. Please arrive 30 minutes prior to your appointment for paperwork. Please bring a copy of your photo ID and insurance card.  Contact information: [address] [place] [phone]             Signed: MiAlferd Ap[facility]PAEn[facility] 12/01/2020, 11:42 AM Please see Amion for pager number during day hours 7:00am-4:30pm

## 2020-12-22 ENCOUNTER — Encounter: Payer: Self-pay | Admitting: Nurse Practitioner

## 2020-12-22 ENCOUNTER — Ambulatory Visit (INDEPENDENT_AMBULATORY_CARE_PROVIDER_SITE_OTHER): Payer: 59 | Admitting: Nurse Practitioner

## 2020-12-22 VITALS — BP 100/66 | HR 92 | Ht 63.0 in | Wt 147.0 lb

## 2020-12-22 DIAGNOSIS — K529 Noninfective gastroenteritis and colitis, unspecified: Secondary | ICD-10-CM

## 2020-12-22 NOTE — Patient Instructions (Signed)
If you are age 48 or older, your body mass index should be between 23-30. Your Body mass index is 26.04 kg/m. If this is out of the aforementioned range listed, please consider follow up with your Primary Care Provider.  If you are age 87 or younger, your body mass index should be between 19-25. Your Body mass index is 26.04 kg/m. If this is out of the aformentioned range listed, please consider follow up with your Primary Care Provider.   We will discuss your case with Dr. Loletha Carrow and will be in touch regarding next steps.  Thank you for entrusting me with your care and for choosing [facility], Tye Savoy, NP-C

## 2020-12-22 NOTE — Progress Notes (Signed)
ASSESSMENT AND PLAN     # 50 year old female who is s/p right hemicolectomy on 11/26/20 for appendicitis / cecal mass. Pathology compatible with acute suppurative appendicitis.  There were areas of transmural inflammation involving the colon raising concern for Crohn's disease. Patient referred by [facility] for further evaluation.   -- I will asked patient's primary GI, Dr. Loletha Carrow to review op and path reports.   # Chronic diarrhea.  Unremarkable colonoscopy with random colon biopsies in 2018.  Negative small bowel biopsies also in 2018.  Today surgery recommended something for diarrhea, patient cannot remember the name.   # History of adenomatous colon polyps.  Very small colon adenoma in 2018.  She is on the recall list for a 5-year interval colonoscopy.  However, based on new polyp surveillance guidelines she may be ok for a 7 year interval colonoscopy (assuming we do not schedule her one for now given recent operative / path finding).     HISTORY OF PRESENT ILLNESS     Chief Complaint : recent surgery for appendicitis  Julia Green is a 50 y.o. female with a past medical history significant for COVID-19 (asymptomatic), chronic diarrhea, hypothyroidism. See additional PMH below.    Patient is Spanish-speaking, family member serves as Astronomer . Patient is known to Dr. Loletha Carrow, last seen in 2018 when she was evaluated for diarrhea.  Colonoscopy  was unremarkable except for a diminutive adenoma.  Random colon biopsies negative for microscopic colitis.  EGD with small bowel biopsies on the same day was negative for celiac disease . Patient is  referred by Nadeen Landau, MD with [facility] for evaluation of possible Crohn's disease. .   Patient was seen by Employee Health at her place of work in early April.  She complained of right-sided abdominal pain. A CT scan was done and raised concern for appendicitis.  She was subsequently sent to the ED and admitted by General Surgery.  On 4/7  she underwent a laparoscopic right hemicolectomy.  Path returned as acute suppurative appendicitis with focal abscess formation and occasional non-necrotizing granulomas with giant cells. There was transmural inflammation involving the segment of colon raising the possibility of Crohn's.   Patient says she had a follow-up appointment with surgery today.  She was advised to keep this appointment.  She was prescribed something to try for diarrhea but cannot recall what it is. She averages about 4 diarrheal episodes a day which includes meal related and nocturnal bowel movements . No blood in stool    Data Reviewed: FINAL MICROSCOPIC DIAGNOSIS:   A. COLON, RIGHT, COLECTOMY:  - Acute suppurative appendicitis with abscess and extensive  peri-appendiceal fibrosis and inflammation.  - Twenty-one reactive lymph nodes (0/21).  - See comment.   COMMENT:  There is acute suppurative appendicitis with focal abscess formation and  there are occasional non-necrotizing granulomas with giant cells. These  findings may represent acute ruptured suppurative appendicitis with  exuberant fibroplasia and peri-appendiceal inflammation. There are foci  with transmural inflammation and involving the segment of colon and this  raises the possibility of Crohn's; clinical correlation is suggested.    PREVIOUS EVALUATIONS:   August 2018 Colonoscopy and EGD or diarrhea or weight loss --complete exam, good prep The examined portion of the ileum was normal. - One 2 mm polyp in the distal ascending colon, removed with a cold biopsy forceps. Resected and retrieved. - Normal mucosa in the entire examined colon. Biopsied. - The examination was otherwise normal on  direct and retroflexion views.  EGD --small amount of food residue in stomach. Otherwise normal exam   Diagnosis 1. Surgical [P], duodenum - BENIGN SMALL BOWEL MUCOSA. - NO VILLOUS BLUNTING OR INCREASE IN INTRAEPITHELIAL LYMPHOCYTES. - NO DYSPLASIA  OR MALIGNANCY. 2. Surgical [P], right colon BX - BENIGN COLONIC MUCOSA. - NO ACTIVE INFLAMMATION OR EVIDENCE OF MICROSCOPIC COLITIS. - NO DYSPLASIA OR MALIGNANCY. 3. Surgical [P], ascending, polyp - TUBULAR ADENOMA. - NO HIGH GRADE DYSPLASIA OR MALIGNANCY. 4. Surgical [P], left colon BXs - BENIGN COLONIC MUCOSA. - NO ACTIVE INFLAMMATION OR EVIDENCE OF MICROSCOPIC COLITIS. - NO DYSPLASIA OR MALIGNANCY.   Past Medical History:  Diagnosis Date  . Arthritis   . Asthma   . Depression   . Fatty liver   . Gallstones   . GERD (gastroesophageal reflux disease)   . Headache   . History of low potassium   . Hypertension   . Hypothyroidism   . Right arm numbness   . Right leg numbness   . Sciatic nerve pain   . Thyroid disease      Past Surgical History:  Procedure Laterality Date  . CARPAL TUNNEL RELEASE Right 08/07/2019   Procedure: RIGHT CARPAL TUNNEL RELEASE;  Surgeon: Leandrew Koyanagi, MD;  Location: [facility];  Service: Orthopedics;  Laterality: Right;  . COLONOSCOPY    . LAPAROSCOPIC RIGHT HEMI COLECTOMY N/A 11/26/2020   Procedure: LAPAROSCOPIC RIGHT HEMI COLECTOMY;  Surgeon: Ileana Roup, MD;  Location: [facility];  Service: General;  Laterality: N/A;  . LAPAROSCOPIC VAGINAL HYSTERECTOMY WITH SALPINGECTOMY Bilateral 06/26/2017   Procedure: LAPAROSCOPIC ASSISTED VAGINAL HYSTERECTOMY WITH SALPINGECTOMY;  Surgeon: Princess Bruins, MD;  Location: [facility];  Service: Gynecology;  Laterality: Bilateral;  request 7:30am OR time  requests 2 hours Rex the lighted retractor rep will be here   . UPPER GI ENDOSCOPY     Family History  Problem Relation Age of Onset  . Hypertension Mother   . Uterine cancer Mother   . Cancer Sister        unknown  . Kidney failure Sister   . Stomach cancer Maternal Aunt   . Kidney failure Father   . Lung disease Father   . High blood pressure Father   . Colon cancer Neg Hx   . Esophageal cancer Neg Hx   . Rectal cancer Neg Hx     Social History   Tobacco Use  . Smoking status: Never Smoker  . Smokeless tobacco: Never Used  Vaping Use  . Vaping Use: Never used  Substance Use Topics  . Alcohol use: No  . Drug use: No   Current Outpatient Medications  Medication Sig Dispense Refill  . acetaminophen (TYLENOL) 500 MG tablet Take 2 tablets (1,000 mg total) by mouth every 8 (eight) hours as needed. 30 tablet 0  . albuterol (PROVENTIL HFA;VENTOLIN HFA) 108 (90 Base) MCG/ACT inhaler Inhale 1-2 puffs into the lungs every 6 (six) hours as needed for wheezing or shortness of breath.    . calcium carbonate (OSCAL) 1500 (600 Ca) MG TABS tablet Take 1,500 mg by mouth daily.    Marland Kitchen estradiol (ESTRACE) 1 MG tablet Take 1 tablet (1 mg total) by mouth daily. 90 tablet 3  . hydrochlorothiazide (HYDRODIURIL) 12.5 MG tablet Take 12.5 mg by mouth daily.    Marland Kitchen levothyroxine (SYNTHROID, LEVOTHROID) 137 MCG tablet Take 1 tablet (137 mcg total) by mouth daily before breakfast. 30 tablet 12  . losartan (COZAAR) 25 MG tablet Take 25  mg by mouth daily. (takes with 50 mg tab; total 73m daily)    . losartan (COZAAR) 50 MG tablet Take 50 mg by mouth daily. (takes with 25 mg tab; total 778mdaily)    . montelukast (SINGULAIR) 10 MG tablet Take 10 mg by mouth at bedtime.    . Marland Kitchenmeprazole (PRILOSEC) 40 MG capsule Take 1 capsule (40 mg total) by mouth daily. 30 capsule 2  . vitamin C (ASCORBIC ACID) 500 MG tablet Take 500 mg by mouth daily.    . Marland Kitchenspirin EC 81 MG tablet Take 81 mg by mouth daily. (Patient not taking: Reported on 12/22/2020)     No current facility-administered medications for this visit.   No Known Allergies   Review of Systems: All systems reviewed and negative except where noted in HPI.    PHYSICAL EXAM :    Wt Readings from Last 3 Encounters:  12/22/20 147 lb (66.7 kg)  11/30/20 153 lb 14.1 oz (69.8 kg)  11/13/20 150 lb (68 kg)    BP 100/66   Pulse 92   Ht 5' 3"  (1.6 m)   Wt 147 lb (66.7 kg)   LMP 03/24/2017    SpO2 97%   BMI 26.04 kg/m  Constitutional:  Pleasant female in no acute distress. Psychiatric: Normal mood and affect. Behavior is normal. EENT: Pupils normal.  Conjunctivae are normal. No scleral icterus. Neck supple.  Cardiovascular: Normal rate, regular rhythm. No edema Pulmonary/chest: Effort normal and breath sounds normal. No wheezing, rales or rhonchi. Abdominal: Soft, nondistended, nontender. Bowel sounds active throughout. There are no masses palpable. No hepatomegaly. Neurological: Alert and oriented to person place and time. Skin: Skin is warm and dry. No rashes noted.  I spent 30 minutes total reviewing records, obtaining history, performing exam, counseling patient and documenting visit / findings.    PaTye SavoyNP  12/22/2020, 2:16 PM  Cc:  Referring Provider ChNadeen LandauMDColumb[facility]

## 2020-12-26 NOTE — Progress Notes (Signed)
____________________________________________________________  Attending physician addendum:  Thank you for sending this case to me. I have reviewed the entire note as well as the recent operative and pathology reports. I also reviewed my 2018 colonoscopy report (with normal TI).  I believe this was more likely appendicitis than Crohn's disease. Questran or welchol would be worth a try here for possible bile-acid diarrhea.  As needed Lomotil can be used as well. Please be sure Spanish-speaking staff (such as my nurse [place]) explained to the patient usual instructions on dosing these meds 2 hours away from other medications. Then I would like to see her in about 8 weeks. I can then see how she is and determine if sooner colonoscopy is necessary.  Wilfrid Lund, MD  ____________________________________________________________

## 2020-12-29 ENCOUNTER — Telehealth: Payer: Self-pay

## 2020-12-29 MED ORDER — CHOLESTYRAMINE 4 G PO PACK: 4.0000 g | PACK | Freq: Two times a day (BID) | ORAL | 2 refills | Status: DC

## 2020-12-29 NOTE — Telephone Encounter (Signed)
Spoke with patient in regards to recommendations as outlined. Prescription for Julia Green has been sent in - pt requested it be sent to [facility]. Patient is aware that she will need to take this medication 1 hour before or 4-6 hours after any other medications. Advised patient that if she begins to develop constipation then she will need to hold the Questran. Patient is scheduled for an 8 week follow up on Tuesday, 03/02/21 at 3:20 PM (patient needed an afternoon appt). Patient verbalized understanding and had no concerns at the end of the call.

## 2020-12-29 NOTE — Telephone Encounter (Signed)
-----   Message from Willia Craze, NP sent at 12/29/2020  2:42 PM EDT ----- Herbert Seta, please contact this Spanish speaking patient and let her know that Dr. Loletha Carrow doesn't think she has Crohn's based on recent operative report. Please ask her to try 4 grams BID ( hold for constipation). She needs to be advised to take his medication 1 hour before or 4-6 hours after any other medication. Please make her an appt with Dr. Loletha Carrow in 8 weeks and he will decide if sooner colonoscopy is needed. Thanks

## 2020-12-31 ENCOUNTER — Telehealth: Payer: Self-pay | Admitting: *Deleted

## 2020-12-31 NOTE — Telephone Encounter (Signed)
-----   Message from Leamon Arnt, [place] sent at 12/31/2020 12:02 PM EDT ----- Patient is calling regarding her estradiol refill. Patient would like an update on why it has not been filled.

## 2020-12-31 NOTE — Telephone Encounter (Signed)
Julia Green please see the below note, this is the patient you spoke with last week. Per ML note she is not taking HRT. Please call patient to discuss and route to [facility].

## 2021-01-04 ENCOUNTER — Telehealth: Payer: Self-pay | Admitting: Gastroenterology

## 2021-01-04 MED ORDER — DIPHENOXYLATE-ATROPINE 2.5-0.025 MG PO TABS: 1.0000 | ORAL_TABLET | Freq: Four times a day (QID) | ORAL | 1 refills | Status: DC | PRN

## 2021-01-04 NOTE — Telephone Encounter (Signed)
Lm on vm for patient to return call 

## 2021-01-04 NOTE — Addendum Note (Signed)
Addended by: Yevette Edwards on: 01/04/2021 12:43 PM   Modules accepted: Orders

## 2021-01-04 NOTE — Addendum Note (Signed)
Addended by: Nelida Meuse on: 01/04/2021 12:35 PM   Modules accepted: Orders

## 2021-01-04 NOTE — Telephone Encounter (Signed)
Spoke with patient, she states that she has had more diarrhea since starting Questran. She states that she has been having diarrhea almost every hour. When she has an episode it is watery diarrhea, not a large amount. Sometimes she will be in the restroom for about an hour. Denies any blood. She states that her appetite is fine and she is drinking plenty. She states that her stomach will make loud noises sometimes before she has to have a BM. She states that she took the medication as directed, 1 hour before or 4-6 hours after any other medications. Please advise, thanks.

## 2021-01-04 NOTE — Telephone Encounter (Signed)
Error

## 2021-01-04 NOTE — Telephone Encounter (Signed)
Patient called said she had to stop the Questran medication because it is giving her more diarrhea and she is up all night.

## 2021-01-04 NOTE — Telephone Encounter (Signed)
Understood, it was worth a try.  This patient has diarrhea after a recent right hemicolectomy for complicated appendicitis.  I will send a prescription for Lomotil tablets instead to help control the diarrhea.  - HD

## 2021-01-04 NOTE — Telephone Encounter (Signed)
Lm on vm for patient to retrun call.

## 2021-01-04 NOTE — Telephone Encounter (Signed)
Spoke with patient, she has been advised that she can stop Questran and a new prescription has been sent to her pharmacy. Patient advised that she will take 1 tablet four times a day as needed for diarrhea and can increase to 2 tablets if necessary. Advised patient to call us back if she continues to have diarrhea. Patient verbalized understanding and had no concerns at the end of the call.

## 2021-02-23 ENCOUNTER — Ambulatory Visit: Payer: 59 | Admitting: Gastroenterology

## 2021-03-02 ENCOUNTER — Encounter: Payer: Self-pay | Admitting: Gastroenterology

## 2021-03-02 ENCOUNTER — Ambulatory Visit (INDEPENDENT_AMBULATORY_CARE_PROVIDER_SITE_OTHER): Payer: 59 | Admitting: Gastroenterology

## 2021-03-02 VITALS — BP 140/70 | HR 76 | Ht 61.0 in | Wt 155.0 lb

## 2021-03-02 DIAGNOSIS — K580 Irritable bowel syndrome with diarrhea: Secondary | ICD-10-CM | POA: Diagnosis not present

## 2021-03-02 DIAGNOSIS — K529 Noninfective gastroenteritis and colitis, unspecified: Secondary | ICD-10-CM | POA: Diagnosis not present

## 2021-03-02 MED ORDER — DIPHENOXYLATE-ATROPINE 2.5-0.025 MG PO TABS: 1.0000 | ORAL_TABLET | Freq: Four times a day (QID) | ORAL | 1 refills | Status: DC | PRN

## 2021-03-02 MED ORDER — DICYCLOMINE HCL 10 MG PO CAPS: 10.0000 mg | ORAL_CAPSULE | Freq: Three times a day (TID) | ORAL | 1 refills | Status: DC

## 2021-03-02 MED ORDER — PLENVU 140 G PO SOLR: 140.0000 g | ORAL | 0 refills | Status: DC

## 2021-03-02 MED ORDER — OMEPRAZOLE 40 MG PO CPDR: 40.0000 mg | DELAYED_RELEASE_CAPSULE | Freq: Every day | ORAL | 1 refills | Status: DC

## 2021-03-02 NOTE — Progress Notes (Signed)
[facility] Progress Note  Chief Complaint: Chronic diarrhea  Subjective  History: Julia Green was seen by our APP 2 months ago after hospitalization for acute suppurative appendicitis requiring right hemicolectomy by Dr. Dema Severin.  Pathology showed areas of transmural inflammation suggesting possible Crohn's disease.  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  After reviewing the clinic note, I recommended a trial of cholestyramine, though she does not appear to have received that message. Her last colonoscopy with me in August 2018 revealed a normal terminal ileum, biopsies negative for microscopic colitis. EGD at that time with biopsies of duodenum negative for celiac sprue.  Julia Green is here with her daughter who also acts as Optometrist.  She has been taking Lomotil at least twice a day, but it sounds like she recently ran out of it.  Without the medicine she has 5-6 loose BMs per day.  She continues to have bloating and urgency for bowel movements, though the symptoms are not always followed by BM.  This might have some associated back pain, all of which she says predates the recent surgery.  ROS: Cardiovascular:  no chest pain Respiratory: no dyspnea  The patient's Past Medical, Family and Social History were reviewed and are on file in the EMR.  Objective:  Med list reviewed  Current Outpatient Medications:    albuterol (PROVENTIL HFA;VENTOLIN HFA) 108 (90 Base) MCG/ACT inhaler, Inhale 1-2 puffs into the lungs every 6 (six) hours as needed for wheezing or shortness of breath., Disp: , Rfl:    aspirin EC 81 MG tablet, Take 81 mg by mouth daily., Disp: , Rfl:    calcium carbonate (OSCAL) 1500 (600 Ca) MG TABS tablet, Take 1,500 mg by mouth daily., Disp: , Rfl:    diphenoxylate-atropine (LOMOTIL) 2.5-0.025 MG tablet, Take 1 tablet by mouth 4 (four) times daily as needed for diarrhea or loose stools. Increase dose to 2 tablets if needed, Disp:  60 tablet, Rfl: 1   estradiol (ESTRACE) 1 MG tablet, Take 1 tablet (1 mg total) by mouth daily., Disp: 90 tablet, Rfl: 3   hydrochlorothiazide (HYDRODIURIL) 12.5 MG tablet, Take 12.5 mg by mouth daily., Disp: , Rfl:    levothyroxine (SYNTHROID, LEVOTHROID) 137 MCG tablet, Take 1 tablet (137 mcg total) by mouth daily before breakfast., Disp: 30 tablet, Rfl: 12   losartan (COZAAR) 25 MG tablet, Take 25 mg by mouth daily. (takes with 50 mg tab; total 81m daily), Disp: , Rfl:    losartan (COZAAR) 50 MG tablet, Take 50 mg by mouth daily. (takes with 25 mg tab; total 790mdaily), Disp: , Rfl:    montelukast (SINGULAIR) 10 MG tablet, Take 10 mg by mouth at bedtime., Disp: , Rfl:    omeprazole (PRILOSEC) 40 MG capsule, Take 1 capsule (40 mg total) by mouth daily., Disp: 30 capsule, Rfl: 2   vitamin C (ASCORBIC ACID) 500 MG tablet, Take 500 mg by mouth daily., Disp: , Rfl:    Vital signs in last 24 hrs: Vitals:   03/02/21 1530  BP: 140/70  Pulse: 76   Wt Readings from Last 3 Encounters:  03/02/21 155 lb (70.3 kg)  12/22/20 147 lb (66.7 kg)  11/30/20 153 lb 14.1 oz (69.8 kg)    Physical Exam Daughter present for entire visit Well-appearing HEENT: sclera anicteric, oral mucosa moist without lesions Neck: supple, no thyromegaly, JVD or lymphadenopathy Cardiac: RRR without murmurs, S1S2 heard, no peripheral edema Pulm: clear to auscultation bilaterally, normal  RR and effort noted Abdomen: soft, no tenderness, with active bowel sounds. No guarding or palpable hepatosplenomegaly. Skin; warm and dry, no jaundice or rash  Labs:   ___________________________________________ Radiologic studies:   ____________________________________________ Other: Surgical pathology specimen from 11/26/2020: There is acute suppurative appendicitis with focal abscess formation and  there are occasional non-necrotizing granulomas with giant cells.  These  findings may represent acute ruptured suppurative  appendicitis with  exuberant fibroplasia and peri-appendiceal inflammation.  There are foci  with transmural inflammation and involving the segment of colon and this  raises the possibility of Crohn's; clinical correlation is suggested.    _____________________________________________ Assessment & Plan  Assessment: Encounter Diagnoses  Name Primary?   Chronic diarrhea Yes   Irritable bowel syndrome with diarrhea    Chronic diarrhea, suspect underlying IBS, now worse after right hemicolectomy.  There may be some bile acid diarrhea. I think Questran may be challenging for her to use with the need to take it a couple of hours away from other medicines. My suspicion is that the pathology findings were all related to severe acute suppurative appendicitis rather than underlying Crohn's disease.  Plan: Dicyclomine 10 mg 3 times daily She requested a refill of Prilosec, 90-day prescription sent Use Lomotil as needed if dicyclomine does not sufficiently control symptoms  Colonoscopy scheduled to evaluate ileum and rule out Crohn's disease.  She was agreeable after discussion of procedure and risks  The benefits and risks of the planned procedure were described in detail with the patient or (when appropriate) their health care proxy.  Risks were outlined as including, but not limited to, bleeding, infection, perforation, adverse medication reaction leading to cardiac or pulmonary decompensation, pancreatitis (if ERCP).  The limitation of incomplete mucosal visualization was also discussed.  No guarantees or warranties were given.   31 minutes were spent on this encounter (including chart review, history/exam, counseling/coordination of care, and documentation) > 50% of that time was spent on counseling and coordination of care.  Topics discussed included: See above.  Nelida Meuse III

## 2021-03-02 NOTE — Patient Instructions (Signed)
If you are age 48 or older, your body mass index should be between 23-30. Your Body mass index is 29.29 kg/m. If this is out of the aforementioned range listed, please consider follow up with your Primary Care Provider.  If you are age 29 or younger, your body mass index should be between 19-25. Your Body mass index is 29.29 kg/m. If this is out of the aformentioned range listed, please consider follow up with your Primary Care Provider.   __________________________________________________________  The [facility] providers would like to encourage you to use [facility] to communicate with providers for non-urgent requests or questions.  Due to long hold times on the telephone, sending your provider a message by [facility] may be a faster and more efficient way to get a response.  Please allow 48 business hours for a response.  Please remember that this is for non-urgent requests.   You have been scheduled for a colonoscopy. Please follow written instructions given to you at your visit today.  Please pick up your prep supplies at the pharmacy within the next 1-3 days. If you use inhalers (even only as needed), please bring them with you on the day of your procedure.  It was a pleasure to see you today!  Thank you for trusting me with your gastrointestinal care!

## 2021-03-04 ENCOUNTER — Telehealth: Payer: 59 | Admitting: Gastroenterology

## 2021-03-05 NOTE — Telephone Encounter (Signed)
Spoke to [place], patients daughter. She sates the Rx cost was $60. I explained that this is good news and the pharmacy applied the coupon as directed.

## 2021-03-11 ENCOUNTER — Encounter: Payer: Self-pay | Admitting: Gastroenterology

## 2021-03-11 ENCOUNTER — Other Ambulatory Visit: Payer: Self-pay

## 2021-03-11 ENCOUNTER — Ambulatory Visit (AMBULATORY_SURGERY_CENTER): Payer: 59 | Admitting: Gastroenterology

## 2021-03-11 VITALS — BP 135/90 | HR 60 | Temp 98.0°F | Resp 14 | Ht 61.0 in | Wt 155.0 lb

## 2021-03-11 DIAGNOSIS — K529 Noninfective gastroenteritis and colitis, unspecified: Secondary | ICD-10-CM

## 2021-03-11 DIAGNOSIS — K5190 Ulcerative colitis, unspecified, without complications: Secondary | ICD-10-CM | POA: Diagnosis not present

## 2021-03-11 MED ORDER — SODIUM CHLORIDE 0.9 % IV SOLN: 500.0000 mL | Freq: Once | INTRAVENOUS | Status: DC

## 2021-03-11 NOTE — Progress Notes (Signed)
Called to room to assist during endoscopic procedure.  Patient ID and intended procedure confirmed with present staff. Received instructions for my participation in the procedure from the performing physician.  

## 2021-03-11 NOTE — Progress Notes (Signed)
A/ox3, pleased with MAC, report to RN 

## 2021-03-11 NOTE — Patient Instructions (Signed)
YOU HAD AN ENDOSCOPIC PROCEDURE TODAY AT THE [facility]:   Refer to the procedure report that was given to you for any specific questions about what was found during the examination.  If the procedure report does not answer your questions, please call your gastroenterologist to clarify.  If you requested that your care partner not be given the details of your procedure findings, then the procedure report has been included in a sealed envelope for you to review at your convenience later.  YOU SHOULD EXPECT: Some feelings of bloating in the abdomen. Passage of more gas than usual.  Walking can help get rid of the air that was put into your GI tract during the procedure and reduce the bloating. If you had a lower endoscopy (such as a colonoscopy or flexible sigmoidoscopy) you may notice spotting of blood in your stool or on the toilet paper. If you underwent a bowel prep for your procedure, you may not have a normal bowel movement for a few days.  Please Note:  You might notice some irritation and congestion in your nose or some drainage.  This is from the oxygen used during your procedure.  There is no need for concern and it should clear up in a day or so.  SYMPTOMS TO REPORT IMMEDIATELY:   Following lower endoscopy (colonoscopy or flexible sigmoidoscopy):  Excessive amounts of blood in the stool  Significant tenderness or worsening of abdominal pains  Swelling of the abdomen that is new, acute  Fever of 100F or higher   Following upper endoscopy (EGD)  Vomiting of blood or coffee ground material  New chest pain or pain under the shoulder blades  Painful or persistently difficult swallowing  New shortness of breath  Fever of 100F or higher  Black, tarry-looking stools  For urgent or emergent issues, a gastroenterologist can be reached at any hour by calling [phone]. Do not use MyChart messaging for urgent concerns.    DIET:  We do recommend a small meal at first, but  then you may proceed to your regular diet.  Drink plenty of fluids but you should avoid alcoholic beverages for 24 hours.  ACTIVITY:  You should plan to take it easy for the rest of today and you should NOT DRIVE or use heavy machinery until tomorrow (because of the sedation medicines used during the test).    FOLLOW UP: Our staff will call the number listed on your records 48-72 hours following your procedure to check on you and address any questions or concerns that you may have regarding the information given to you following your procedure. If we do not reach you, we will leave a message.  We will attempt to reach you two times.  During this call, we will ask if you have developed any symptoms of COVID 19. If you develop any symptoms (ie: fever, flu-like symptoms, shortness of breath, cough etc.) before then, please call [phone].  If you test positive for Covid 19 in the 2 weeks post procedure, please call and report this information to us.    If any biopsies were taken you will be contacted by phone or by letter within the next 1-3 weeks.  Please call us at [phone] if you have not heard about the biopsies in 3 weeks.    SIGNATURES/CONFIDENTIALITY: You and/or your care partner have signed paperwork which will be entered into your electronic medical record.  These signatures attest to the fact that that the information above on   your After Visit Summary has been reviewed and is understood.  Full responsibility of the confidentiality of this discharge information lies with you and/or your care-partner. 

## 2021-03-11 NOTE — Op Note (Signed)
[place] Patient Name: Julia Green Procedure Date: 03/11/2021 7:18 AM MRN: [id number] Endoscopist: Mallie Mussel L. Loletha Carrow , MD Age: 50 Referring MD:  Date of Birth: [birth year] Gender: Female Account #: [id number] Procedure:                Colonoscopy Indications:              Chronic diarrhea (see 03/02/21 office note for                            clinical details) Medicines:                Monitored Anesthesia Care Procedure:                Pre-Anesthesia Assessment:                           - Prior to the procedure, a History and Physical                            was performed, and patient medications and                            allergies were reviewed. The patient's tolerance of                            previous anesthesia was also reviewed. The risks                            and benefits of the procedure and the sedation                            options and risks were discussed with the patient.                            All questions were answered, and informed consent                            was obtained. Prior Anticoagulants: The patient has                            taken no previous anticoagulant or antiplatelet                            agents. ASA Grade Assessment: II - A patient with                            mild systemic disease. After reviewing the risks                            and benefits, the patient was deemed in                            satisfactory condition to undergo the procedure.  After obtaining informed consent, the colonoscope                            was passed under direct vision. Throughout the                            procedure, the patient's blood pressure, pulse, and                            oxygen saturations were monitored continuously. The                            Olympus PCF-H190DL (#[id number]) Colonoscope was                            introduced through the anus and advanced  to the the                            terminal ileum. The colonoscopy was performed                            without difficulty. The patient tolerated the                            procedure well. The quality of the bowel                            preparation was excellent. The rectum and                            Neo-terminal ileum and ileo-colonic anastomosis                            were photographed. Scope In: 8:04:09 AM Scope Out: 8:15:08 AM Scope Withdrawal Time: 0 hours 8 minutes 58 seconds  Total Procedure Duration: 0 hours 10 minutes 59 seconds  Findings:                 The perianal and digital rectal examinations were                            normal.                           The neo-terminal ileum appeared normal.(deeply                            intubated - at least 20 cm)                           There was evidence of a prior side-to-side                            ileo-colonic anastomosis in the proximal transverse  colon. This was widely patent and was characterized                            by inflammation (erythema, erosions). The                            anastomosis was traversed.                           Normal mucosa was found in the remainder of the                            entire colon. Biopsies for histology were taken                            with a cold forceps from the transverse colon and                            sigmoid colon for evaluation of microscopic colitis.                           The exam was otherwise without abnormality on                            direct and retroflexion views. Complications:            No immediate complications. Estimated Blood Loss:     Estimated blood loss was minimal. Impression:               - The examined portion of the ileum was normal.                           - Patent side-to-side ileo-colonic anastomosis,                            characterized by inflammation.                            - Normal mucosa in the entire examined colon.                            Biopsied.                           - The examination was otherwise normal on direct                            and retroflexion views.                           The appearance of the anastomosis appears typical                            for a right hemicolectomy performed 3 months ago.  Assuming biopsies do not show other findings,                            overall clinical scenario most c/w IBS exacerbated                            by ileo-colonic resection, possible bile-aicd                            diarrhea rather than Crohn's disease. Recommendation:           - Patient has a contact number available for                            emergencies. The signs and symptoms of potential                            delayed complications were discussed with the                            patient. Return to normal activities tomorrow.                            Written discharge instructions were provided to the                            patient.                           - Resume previous diet.                           - Continue present medications.                           - Await pathology results.                           - Repeat colonoscopy in 10 years for screening                            purposes.                           - Return to my office at appointment to be                            scheduled. Oddie Kuhlmann L. Loletha Carrow, MD 03/11/2021 8:25:45 AM This report has been signed electronically.

## 2021-03-11 NOTE — Progress Notes (Signed)
Medical history reviewed with no changes noted. VS assessed by C.W 

## 2021-03-15 ENCOUNTER — Telehealth: Payer: Self-pay | Admitting: *Deleted

## 2021-03-15 NOTE — Telephone Encounter (Signed)
  Follow up Call-  Call back number 03/11/2021  Post procedure Call Back phone  # [phone]  Permission to leave phone message Yes  Some recent data might be hidden     Patient questions:  Do you have a fever, pain , or abdominal swelling? No. Pain Score  0 *  Have you tolerated food without any problems? Yes.    Have you been able to return to your normal activities? Yes.    Do you have any questions about your discharge instructions: Diet   No. Medications  No. Follow up visit  No.  Do you have questions or concerns about your Care? No.  Actions: * If pain score is 4 or above: No action needed, pain <4.  Have you developed a fever since your procedure? no  2.   Have you had an respiratory symptoms (SOB or cough) since your procedure? no  3.   Have you tested positive for COVID 19 since your procedure no  4.   Have you had any family members/close contacts diagnosed with the COVID 19 since your procedure?  no   If yes to any of these questions please route to Joylene John, RN and Joella Prince, RN

## 2021-03-22 ENCOUNTER — Telehealth: Payer: Self-pay | Admitting: Gastroenterology

## 2021-03-22 NOTE — Telephone Encounter (Signed)
Patient called states she has not noticed a change even with taking the Dicyclomine medication please advise.

## 2021-03-23 NOTE — Telephone Encounter (Signed)
Spoke with patient, see 03/11/21 pathology result note for more information.

## 2021-04-28 ENCOUNTER — Ambulatory Visit (INDEPENDENT_AMBULATORY_CARE_PROVIDER_SITE_OTHER): Payer: 59 | Admitting: Gastroenterology

## 2021-04-28 ENCOUNTER — Encounter: Payer: Self-pay | Admitting: Gastroenterology

## 2021-04-28 VITALS — BP 104/70 | HR 68 | Ht 62.0 in | Wt 152.0 lb

## 2021-04-28 DIAGNOSIS — K529 Noninfective gastroenteritis and colitis, unspecified: Secondary | ICD-10-CM

## 2021-04-28 MED ORDER — RIFAXIMIN 550 MG PO TABS: 550.0000 mg | ORAL_TABLET | Freq: Two times a day (BID) | ORAL | 0 refills | Status: DC

## 2021-04-28 NOTE — Progress Notes (Addendum)
[facility] Progress Note  Chief Complaint: Chronic diarrhea  Subjective  History: Julia Green is seen in follow-up after her July 21 colonoscopy.  I saw her in 2018 for chronic diarrhea, details outlined in those notes.  Upper endoscopy and colonoscopy to terminal ileum with biopsies of the duodenum and colon did not reveal a specific cause.  Stool testing was negative for ova and parasites and C. difficile, but she had a markedly decreased fecal elastase at 81.  However, she did not have risk factors for EPI, and a trial of pancreatic enzyme supplement did not improve her symptoms.  I gave her empiric therapy for possible SIBO with 7 days of cephalexin and metronidazole.  CT abdomen and pelvis with oral and IV contrast rule out changes of chronic pancreatitis.  Seemed most consistent with IBS, though she had reported no improvement with antispasmodic medicine or Lomotil. Several months ago she had severe acute suppurative appendicitis requiring right hemicolectomy.  Pathologic results suggest some features that may be consistent with Crohn's disease. Most recent colonoscopy showed a normal neoterminal ileum (intubated 20 cm), ileocolonic anastomosis with erythema and erosions.  Biopsies with results as noted below.  Not withstanding the unclear description of "focal active colitis", the overall clinical picture did not look like Crohn's disease in my opinion.  Saharra is here with her daughter who provides Spanish translation.  She is feeling much about the same, and says Lomotil has not been helping her at all.  She has several loose stools per day without blood, usually occurring soon after meals.  Her appetite is generally good, stools are not oily or floating, and her weight has been stable.  Reports her stress level as "okay". Abdominal bloating and intermittent cramps, no rash, eye redness or joint pain.  ROS: Cardiovascular:  no chest pain Respiratory: no dyspnea  The patient's Past  Medical, Family and Social History were reviewed and are on file in the EMR. Past Surgical History:  Procedure Laterality Date   CARPAL TUNNEL RELEASE Right 08/07/2019   Procedure: RIGHT CARPAL TUNNEL RELEASE;  Surgeon: Leandrew Koyanagi, MD;  Location: [facility];  Service: Orthopedics;  Laterality: Right;   COLONOSCOPY     LAPAROSCOPIC RIGHT HEMI COLECTOMY N/A 11/26/2020   Procedure: LAPAROSCOPIC RIGHT HEMI COLECTOMY;  Surgeon: Ileana Roup, MD;  Location: [facility];  Service: General;  Laterality: N/A;   LAPAROSCOPIC VAGINAL HYSTERECTOMY WITH SALPINGECTOMY Bilateral 06/26/2017   Procedure: LAPAROSCOPIC ASSISTED VAGINAL HYSTERECTOMY WITH SALPINGECTOMY;  Surgeon: Princess Bruins, MD;  Location: [facility];  Service: Gynecology;  Laterality: Bilateral;  request 7:30am OR time  requests 2 hours Rex the lighted retractor rep will be here    UPPER GI ENDOSCOPY      Objective:  Med list reviewed  Current Outpatient Medications:    albuterol (PROVENTIL HFA;VENTOLIN HFA) 108 (90 Base) MCG/ACT inhaler, Inhale 1-2 puffs into the lungs every 6 (six) hours as needed for wheezing or shortness of breath., Disp: , Rfl:    aspirin EC 81 MG tablet, Take 81 mg by mouth daily., Disp: , Rfl:    calcium carbonate (OSCAL) 1500 (600 Ca) MG TABS tablet, Take 1,500 mg by mouth daily., Disp: , Rfl:    dicyclomine (BENTYL) 10 MG capsule, Take 1 capsule (10 mg total) by mouth 3 (three) times daily before meals., Disp: 90 capsule, Rfl: 1   diphenoxylate-atropine (LOMOTIL) 2.5-0.025 MG tablet, Take 1 tablet by mouth 4 (four) times daily as needed for diarrhea or  loose stools. Increase dose to 2 tablets if needed, Disp: 90 tablet, Rfl: 1   estradiol (ESTRACE) 1 MG tablet, Take 1 tablet (1 mg total) by mouth daily., Disp: 90 tablet, Rfl: 3   hydrochlorothiazide (HYDRODIURIL) 12.5 MG tablet, Take 12.5 mg by mouth daily., Disp: , Rfl:    levothyroxine (SYNTHROID, LEVOTHROID) 137 MCG tablet, Take 1 tablet (137  mcg total) by mouth daily before breakfast., Disp: 30 tablet, Rfl: 12   losartan (COZAAR) 25 MG tablet, Take 25 mg by mouth daily. (takes with 50 mg tab; total 61m daily), Disp: , Rfl:    losartan (COZAAR) 50 MG tablet, Take 50 mg by mouth daily. (takes with 25 mg tab; total 760mdaily), Disp: , Rfl:    montelukast (SINGULAIR) 10 MG tablet, Take 10 mg by mouth at bedtime., Disp: , Rfl:    omeprazole (PRILOSEC) 40 MG capsule, Take 1 capsule (40 mg total) by mouth daily., Disp: 90 capsule, Rfl: 1   vitamin C (ASCORBIC ACID) 500 MG tablet, Take 500 mg by mouth daily., Disp: , Rfl:    Vital signs in last 24 hrs: Vitals:   04/28/21 1532  BP: 104/70  Pulse: 68   Wt Readings from Last 3 Encounters:  04/28/21 152 lb (68.9 kg)  03/11/21 155 lb (70.3 kg)  03/02/21 155 lb (70.3 kg)  Weight stable last couple of his Physical Exam  Well-appearing.  Daughter present for entire visit. HEENT: sclera anicteric, oral mucosa moist without lesions Neck: supple, no thyromegaly, JVD or lymphadenopathy Cardiac: RRR without murmurs, S1S2 heard, no peripheral edema Pulm: clear to auscultation bilaterally, normal RR and effort noted Abdomen: soft, no tenderness, with active bowel sounds. No guarding or palpable hepatosplenomegaly. Skin; warm and dry, no jaundice or rash  Labs:   ___________________________________________ Radiologic studies:   ____________________________________________ Other: 1. Surgical [P], transverse colon anastomosis inflammation - ANASTOMOTIC MUCOSA SHOWING ACTIVE CHRONIC INFLAMMATION WITH EROSION. SEE NOTE - NEGATIVE FOR GRANULOMAS OR DYSPLASIA 2. Surgical [P], random colon sites - FOCAL ACTIVE COLITIS - NEGATIVE FOR GRANULOMAS OR DYSPLASIA  _____________________________________________ Assessment & Plan  Assessment: No diagnosis found. Chronic diarrhea  This remains a puzzling case, her cause of diarrhea is still unclear.  The only abnormality on testing several  years ago was a low fecal elastase, but she did not have risk factors for EPI and did not improve on a trial of pancreatic enzyme supplement.  I suspect this elastase level was artificially low/diluted by some other cause of watery diarrhea.  No microscopic colitis or upper small bowel disease such as sprue was found at that time. Difficult to know what to make of the "focal active colitis" noted above, but is not described as having features of microscopic colitis.  I believe the inflammation at the anastomosis is typical for the surgical anastomosis and not Crohn's disease.  Again, the deeply intubated neo-TI was normal.  Plan: Rifaximin 550 mg twice daily x14 days as empiric therapy for possible SIBO.  If not improved afterward, then a trial of budesonide in case these biopsy findings do represent microscopic colitis.  If not improved, trial of Viberzi.  32 minutes were spent on this encounter (including chart review, history/exam, counseling/coordination of care, and documentation) > 50% of that time was spent on counseling and coordination of care.   HeNelida MeuseII

## 2021-04-28 NOTE — Patient Instructions (Signed)
If you are age 47 or older, your body mass index should be between 23-30. Your Body mass index is 27.8 kg/m. If this is out of the aforementioned range listed, please consider follow up with your Primary Care Provider.  If you are age 81 or younger, your body mass index should be between 19-25. Your Body mass index is 27.8 kg/m. If this is out of the aformentioned range listed, please consider follow up with your Primary Care Provider.   __________________________________________________________  The [facility] providers would like to encourage you to use [facility] to communicate with providers for non-urgent requests or questions.  Due to long hold times on the telephone, sending your provider a message by [facility] may be a faster and more efficient way to get a response.  Please allow 48 business hours for a response.  Please remember that this is for non-urgent requests.   We have sent your demographic information and a prescription for Xifaxan to [facility]. This pharmacy is able to get medication approved through insurance and get you the lowest copay possible. If you have not heard from them within 1 week, please call our office at [phone] to let us know.  It was a pleasure to see you today!  Thank you for trusting me with your gastrointestinal care!

## 2021-05-18 ENCOUNTER — Other Ambulatory Visit: Payer: Self-pay | Admitting: Gastroenterology

## 2021-05-26 ENCOUNTER — Ambulatory Visit (INDEPENDENT_AMBULATORY_CARE_PROVIDER_SITE_OTHER): Payer: 59 | Admitting: Nurse Practitioner

## 2021-05-26 ENCOUNTER — Other Ambulatory Visit: Payer: Self-pay

## 2021-05-26 ENCOUNTER — Telehealth: Payer: Self-pay

## 2021-05-26 ENCOUNTER — Encounter: Payer: Self-pay | Admitting: Nurse Practitioner

## 2021-05-26 VITALS — BP 128/84

## 2021-05-26 DIAGNOSIS — N898 Other specified noninflammatory disorders of vagina: Secondary | ICD-10-CM

## 2021-05-26 DIAGNOSIS — N952 Postmenopausal atrophic vaginitis: Secondary | ICD-10-CM

## 2021-05-26 DIAGNOSIS — R300 Dysuria: Secondary | ICD-10-CM

## 2021-05-26 LAB — URINALYSIS W MICROSCOPIC + REFLEX CULTURE
Bacteria, UA: NONE SEEN /HPF
Bilirubin Urine: NEGATIVE
Glucose, UA: NEGATIVE
Hgb urine dipstick: NEGATIVE
Hyaline Cast: NONE SEEN /LPF
Ketones, ur: NEGATIVE
Leukocyte Esterase: NEGATIVE
Nitrites, Initial: NEGATIVE
Protein, ur: NEGATIVE
RBC / HPF: NONE SEEN /HPF (ref 0–2)
Specific Gravity, Urine: 1.015 (ref 1.001–1.035)
WBC, UA: NONE SEEN /HPF (ref 0–5)
pH: 5.5 (ref 5.0–8.0)

## 2021-05-26 LAB — WET PREP FOR TRICH, YEAST, CLUE

## 2021-05-26 LAB — NO CULTURE INDICATED

## 2021-05-26 NOTE — Progress Notes (Signed)
   Acute Office Visit  Subjective:    Patient ID: Julia Green, female    DOB: March 05, 1971, 50 y.o.   MRN: [id number]   HPI 50 y.o. presents today for vaginal discharge, itching, and burning with urination that started 1 week ago. She also complains of vaginal dryness.   Review of Systems  Constitutional: Negative.   Genitourinary:  Positive for vaginal discharge and vaginal pain (itching).      Objective:    Physical Exam Constitutional:      Appearance: Normal appearance.  Genitourinary:    General: Normal vulva.     Vagina: Normal.    BP 128/84   LMP 03/24/2017  Wt Readings from Last 3 Encounters:  04/28/21 152 lb (68.9 kg)  03/11/21 155 lb (70.3 kg)  03/02/21 155 lb (70.3 kg)   Wet prep negative UA negative     Assessment & Plan:   Problem List Items Addressed This Visit   None Visit Diagnoses     Post-menopausal atrophic vaginitis    -  Primary   Vaginal discharge       Relevant Orders   WET PREP FOR [place], YEAST, CLUE   Burning with urination       Relevant Orders   Urinalysis w microscopic + reflex cultur      Plan: Reassurance provided on normal wet prep, UA, and exam. Symptoms likely from vaginal dryness/menopausal changes. Recommend applying OTC lubricants such as Replens or coconut oil for hydration. She is agreeable to plan.      Tamela Gammon DNP, 2:57 PM 05/26/2021

## 2021-05-26 NOTE — Telephone Encounter (Signed)
"  Julia Green, [place] Triage    Patient was here today to see TW for problem visit. She asked TW for a refill on her estradiol 19m and TW told her to check with Dr MMarguerita Merlesfor the refill. Can you please send this refill thank you.. pt's pharmacy is listed below.  [facility] [address] "

## 2021-05-26 NOTE — Telephone Encounter (Signed)
Julia Green spoke with patient and patient requesting Rx for Estradiol 1 mg. She was previously on. Patient said menopausal sx have returned. S/P LAVH.  AEX was 11/13/20.

## 2021-05-28 MED ORDER — ESTRADIOL 1 MG PO TABS: 1.0000 mg | ORAL_TABLET | Freq: Every day | ORAL | 1 refills | Status: DC

## 2021-05-28 NOTE — Telephone Encounter (Signed)
Rx sent to pharmacy, will route to [place] to relay.

## 2021-06-04 ENCOUNTER — Telehealth: Payer: Self-pay | Admitting: Gastroenterology

## 2021-06-04 NOTE — Telephone Encounter (Signed)
Spoke with patient, she states that she has finished Xifaxan and has not seen any improvement in her symptoms. Pt reports that she has about 5-6 small episodes of diarrhea in a day. Pt denies any abdominal pain. Pt reports that she has seen a small amount of BRB. I spoke with patient's daughter as well and advised that they call the pharmacy and see if she has a refill of Lomotil left. Advised that she will need to try Lomotil for a few days and if no improvement to call us back. Pt's daughter verbalized understanding and had no concerns at the end of the call.

## 2021-06-04 NOTE — Telephone Encounter (Signed)
Inbound call from pt requesting a call back stating that she is still having diarrhea. Please advise. Thank you.

## 2021-06-08 NOTE — Telephone Encounter (Signed)
Dr. Loletha Carrow, please see notes below. Would you like to refill Lomotil or try Budesonide as mentioned in your last office note? Please advise, thanks

## 2021-06-08 NOTE — Telephone Encounter (Signed)
Lomotil previously did not help her diarrhea.  Per the plans in my most recent office note, we will try her on budesonide.  Budesonide 3 mg (Entocort) Sig: 3 capsules once daily Dispense 90 capsules, 1 refill  See me in clinic in about 6 weeks  HD

## 2021-06-08 NOTE — Addendum Note (Signed)
Addended by: Yevette Edwards on: 06/08/2021 03:48 PM   Modules accepted: Orders

## 2021-06-08 NOTE — Telephone Encounter (Signed)
Left message on vm for patient's daughter to return call.

## 2021-06-08 NOTE — Telephone Encounter (Signed)
Inbound from patient daughter states she does not have any refill for lomotil and would need it.

## 2021-06-09 NOTE — Telephone Encounter (Signed)
Patient's daughter returned your call.  Please call back.  Thank you.

## 2021-06-09 NOTE — Telephone Encounter (Signed)
Lm on vm for patient to return call 

## 2021-06-09 NOTE — Telephone Encounter (Signed)
Lm on vm for patient's daughter to return call.

## 2021-06-10 MED ORDER — BUDESONIDE 3 MG PO CPEP: ORAL_CAPSULE | ORAL | 1 refills | Status: DC

## 2021-06-10 NOTE — Addendum Note (Signed)
Addended by: Yevette Edwards on: 06/10/2021 10:17 AM   Modules accepted: Orders

## 2021-06-10 NOTE — Telephone Encounter (Signed)
Spoke with patient in regards to recommendations. Pt would like RX sent to [facility] on file. Pt needs an afternoon appt, she has been scheduled for a follow up with Dr. Loletha Carrow on Tuesday, 08/03/21 at 3:40 pm. Patient verbalized understanding and had no concerns at the end of the call.

## 2021-07-24 ENCOUNTER — Encounter (HOSPITAL_COMMUNITY): Payer: Self-pay | Admitting: *Deleted

## 2021-07-24 ENCOUNTER — Other Ambulatory Visit: Payer: Self-pay

## 2021-07-24 ENCOUNTER — Emergency Department (HOSPITAL_COMMUNITY)
Admission: EM | Admit: 2021-07-24 | Discharge: 2021-07-25 | Disposition: A | Discharge status: Home / Self Care | Payer: 59 | Attending: Emergency Medicine | Admitting: Emergency Medicine

## 2021-07-24 ENCOUNTER — Emergency Department (HOSPITAL_COMMUNITY): Payer: 59

## 2021-07-24 DIAGNOSIS — R55 Syncope and collapse: Secondary | ICD-10-CM | POA: Diagnosis present

## 2021-07-24 DIAGNOSIS — Z79899 Other long term (current) drug therapy: Secondary | ICD-10-CM | POA: Insufficient documentation

## 2021-07-24 DIAGNOSIS — I10 Essential (primary) hypertension: Secondary | ICD-10-CM | POA: Insufficient documentation

## 2021-07-24 DIAGNOSIS — Z7982 Long term (current) use of aspirin: Secondary | ICD-10-CM | POA: Insufficient documentation

## 2021-07-24 DIAGNOSIS — E039 Hypothyroidism, unspecified: Secondary | ICD-10-CM | POA: Insufficient documentation

## 2021-07-24 DIAGNOSIS — J45909 Unspecified asthma, uncomplicated: Secondary | ICD-10-CM | POA: Diagnosis not present

## 2021-07-24 DIAGNOSIS — Z85038 Personal history of other malignant neoplasm of large intestine: Secondary | ICD-10-CM | POA: Insufficient documentation

## 2021-07-24 DIAGNOSIS — M5431 Sciatica, right side: Secondary | ICD-10-CM | POA: Diagnosis not present

## 2021-07-24 DIAGNOSIS — M5432 Sciatica, left side: Secondary | ICD-10-CM | POA: Insufficient documentation

## 2021-07-24 LAB — COMPREHENSIVE METABOLIC PANEL
ALT: 36 U/L (ref 0–44)
AST: 31 U/L (ref 15–41)
Albumin: 3.6 g/dL (ref 3.5–5.0)
Alkaline Phosphatase: 78 U/L (ref 38–126)
Anion gap: 6 (ref 5–15)
BUN: 11 mg/dL (ref 6–20)
CO2: 28 mmol/L (ref 22–32)
Calcium: 8.6 mg/dL — ABNORMAL LOW (ref 8.9–10.3)
Chloride: 104 mmol/L (ref 98–111)
Creatinine, Ser: 0.67 mg/dL (ref 0.44–1.00)
GFR, Estimated: >60 mL/min (ref >60)
Glucose, Bld: 112 mg/dL — ABNORMAL HIGH (ref 70–99)
Potassium: 3.4 mmol/L — ABNORMAL LOW (ref 3.5–5.1)
Sodium: 138 mmol/L (ref 135–145)
Total Bilirubin: 0.3 mg/dL (ref 0.3–1.2)
Total Protein: 7 g/dL (ref 6.5–8.1)

## 2021-07-24 LAB — CBC WITH DIFFERENTIAL/PLATELET
Abs Immature Granulocytes: 0.02 10*3/uL (ref 0.00–0.07)
Basophils Absolute: 0 10*3/uL (ref 0.0–0.1)
Basophils Relative: 1 %
Eosinophils Absolute: 0.1 10*3/uL (ref 0.0–0.5)
Eosinophils Relative: 1 %
HCT: 36.3 % (ref 36.0–46.0)
Hemoglobin: 12.1 g/dL (ref 12.0–15.0)
Immature Granulocytes: 0 %
Lymphocytes Relative: 32 %
Lymphs Abs: 2.7 10*3/uL (ref 0.7–4.0)
MCH: 30.6 pg (ref 26.0–34.0)
MCHC: 33.3 g/dL (ref 30.0–36.0)
MCV: 91.7 fL (ref 80.0–100.0)
Monocytes Absolute: 1 10*3/uL (ref 0.1–1.0)
Monocytes Relative: 12 %
Neutro Abs: 4.7 10*3/uL (ref 1.7–7.7)
Neutrophils Relative %: 54 %
Platelets: 336 10*3/uL (ref 150–400)
RBC: 3.96 MIL/uL (ref 3.87–5.11)
RDW: 13.2 % (ref 11.5–15.5)
WBC: 8.5 10*3/uL (ref 4.0–10.5)
nRBC: 0 % (ref 0.0–0.2)

## 2021-07-24 LAB — I-STAT BETA HCG BLOOD, ED (MC, WL, AP ONLY): I-stat hCG, quantitative: <5 m[IU]/mL (ref <5)

## 2021-07-24 NOTE — ED Provider Notes (Signed)
Emergency Medicine Provider Triage Evaluation Note  Julia Green Julia Green , a 50 y.o. female  was evaluated in triage.  Complains of syncopal episode that she had earlier today after she had some severe right groin pain.  She apparently had lost consciousness for about 2 to 3 minutes.  She is back to her baseline now.  She has been having this bilateral intermittent groin pain for several months now.  Whenever she had that she does feel faint, but she has never passed out before.  She has no leg swelling or mass near that area.  She has been evaluated for DVTs before and they were all negative.  Review of Systems  Positive:  Negative: See above  Physical Exam  BP (!) 135/91 (BP Location: Left Arm)   Pulse 72   Temp 98.1 F (36.7 C)   Resp 17   Ht 5' 2"  (1.575 m)   Wt 68.9 kg   LMP 03/24/2017   SpO2 100%   BMI 27.78 kg/m  Gen:   Awake, no distress   Resp:  Normal effort  MSK:   Moves extremities without difficulty  Other:  No leg swelling  Medical Decision Making  Medically screening exam initiated at 6:35 PM.  Appropriate orders placed.  Mckenzye L kyilee gregg was informed that the remainder of the evaluation will be completed by another provider, this initial triage assessment does not replace that evaluation, and the importance of remaining in the ED until their evaluation is complete.     Sheila Oats 07/24/21 1837    Carmin Muskrat, MD 07/24/21 [phone]

## 2021-07-24 NOTE — ED Triage Notes (Signed)
The pt is c/o rt groin then lt froin earlier  she fainted according to her daughter.  C/o pain lt groin now earlier today she had rt groin pain

## 2021-07-25 MED ORDER — PANTOPRAZOLE SODIUM 20 MG PO TBEC: 20.0000 mg | DELAYED_RELEASE_TABLET | Freq: Every day | ORAL | 0 refills | Status: DC

## 2021-07-25 MED ORDER — KETOROLAC TROMETHAMINE 60 MG/2ML IM SOLN
30.0000 mg | Freq: Once | INTRAMUSCULAR | Status: AC
  Administered 2021-07-25: 06:00:00 30 mg via INTRAMUSCULAR
  Filled 2021-07-25: qty 2

## 2021-07-25 MED ORDER — PREDNISONE 20 MG PO TABS: ORAL_TABLET | ORAL | 0 refills | Status: DC

## 2021-07-25 MED ORDER — MELOXICAM 7.5 MG PO TABS: 7.5000 mg | ORAL_TABLET | Freq: Every day | ORAL | 0 refills | Status: AC

## 2021-07-25 MED ORDER — METHYLPREDNISOLONE SODIUM SUCC 125 MG IJ SOLR
125.0000 mg | Freq: Once | INTRAMUSCULAR | Status: AC
  Administered 2021-07-25: 06:00:00 125 mg via INTRAMUSCULAR
  Filled 2021-07-25: qty 2

## 2021-07-25 NOTE — ED Provider Notes (Signed)
[facility] EMERGENCY DEPARTMENT Provider Note   CSN: [id number] Arrival date & time: 07/24/21  1721     History Chief Complaint  Patient presents with   Near Syncope    Julia Green is a 50 y.o. female.  50 year old female who presents to the emerged from today with a near syncopal episode after onset of pain.  Apparently this is happened in the past.  Her daughter helps interpret.  She states that she had onset of right groin pain that radiate down to her knee.  She states this is similar to episodes of back pain that she has had in the past.  She has been worked up for DVT had MRIs and multiple other tests over the last year or 2 but she states that this pain was worse than before felt like someone was twisting her bone.  This caused her pain enough to the point where she fell she did not pass out and subsequently did.  No palpitations, headache, chest pain or any other associated symptoms with it.  When she woke up she was back to normal pretty quickly.  No shortness of breath, recent illnesses, fevers.   Near Syncope      Past Medical History:  Diagnosis Date   Arthritis    Asthma    Depression    Fatty liver    Gallstones    GERD (gastroesophageal reflux disease)    Headache    History of low potassium    Hypertension    Hypothyroidism    Right arm numbness    Right leg numbness    Sciatic nerve pain    Thyroid disease     Patient Active Problem List   Diagnosis Date Noted   Acute appendicitis 11/26/2020   Colon cancer ([place]) 11/26/2020   Carpal tunnel syndrome on right 08/07/2019   Right low back pain 07/17/2018   Neck pain 07/17/2018   Postoperative state 06/26/2017   Rotator cuff syndrome of right shoulder 05/30/2017   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   Abdominal bloating 03/22/2017   Other specified hypothyroidism 11/16/2016   Dysmenorrhea 07/20/2016   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    Dyspareunia, female 06/16/2016    Past Surgical History:  Procedure Laterality Date   CARPAL TUNNEL RELEASE Right 08/07/2019   Procedure: RIGHT CARPAL TUNNEL RELEASE;  Surgeon: Leandrew Koyanagi, MD;  Location: [place];  Service: Orthopedics;  Laterality: Right;   COLONOSCOPY     LAPAROSCOPIC RIGHT HEMI COLECTOMY N/A 11/26/2020   Procedure: LAPAROSCOPIC RIGHT HEMI COLECTOMY;  Surgeon: Ileana Roup, MD;  Location: [place];  Service: General;  Laterality: N/A;   LAPAROSCOPIC VAGINAL HYSTERECTOMY WITH SALPINGECTOMY Bilateral 06/26/2017   Procedure: LAPAROSCOPIC ASSISTED VAGINAL HYSTERECTOMY WITH SALPINGECTOMY;  Surgeon: Princess Bruins, MD;  Location: [facility];  Service: Gynecology;  Laterality: Bilateral;  request 7:30am OR time  requests 2 hours Rex the lighted retractor rep will be here    UPPER GI ENDOSCOPY       OB History     Gravida  4   Para  4   Term      Preterm      AB      Living  4      SAB      IAB      Ectopic      Multiple      Live Births  Family History  Problem Relation Age of Onset   Hypertension Mother    Uterine cancer Mother    Kidney failure Father    Lung disease Father    High blood pressure Father    Cancer Sister        unknown   Kidney failure Sister    Stomach cancer Maternal Aunt    Colon cancer Neg Hx    Esophageal cancer Neg Hx    Rectal cancer Neg Hx     Social History   Tobacco Use   Smoking status: Never   Smokeless tobacco: Never  Vaping Use   Vaping Use: Never used  Substance Use Topics   Alcohol use: No   Drug use: No    Home Medications Prior to Admission medications   Medication Sig Start Date End Date Taking? Authorizing Provider  meloxicam (MOBIC) 7.5 MG tablet Take 1 tablet (7.5 mg total) by mouth daily for 10 days. 07/25/21 08/04/21 Yes Darsh Vandevoort, Corene Cornea, MD  pantoprazole (PROTONIX) 20 MG tablet Take 1 tablet (20 mg total) by mouth daily for 14 days. 07/25/21 08/08/21 Yes  Arther Heisler, Corene Cornea, MD  predniSONE (DELTASONE) 20 MG tablet 3 tabs po daily x 3 days, then 2 tabs x 3 days, then 1.5 tabs x 3 days, then 1 tab x 3 days, then 0.5 tabs x 3 days 07/25/21  Yes Esther Bradstreet, Corene Cornea, MD  albuterol (PROVENTIL HFA;VENTOLIN HFA) 108 (90 Base) MCG/ACT inhaler Inhale 1-2 puffs into the lungs every 6 (six) hours as needed for wheezing or shortness of breath.    [provider]  aspirin EC 81 MG tablet Take 81 mg by mouth daily.    [provider]  budesonide (ENTOCORT EC) 3 MG 24 hr capsule Take 3 capsules (9 mg total) by mouth once daily. 06/10/21   Doran Stabler, MD  calcium carbonate (OSCAL) 1500 (600 Ca) MG TABS tablet Take 1,500 mg by mouth daily.    [provider]  estradiol (ESTRACE) 1 MG tablet Take 1 tablet (1 mg total) by mouth daily. 05/28/21   Princess Bruins, MD  hydrochlorothiazide (HYDRODIURIL) 12.5 MG tablet Take 12.5 mg by mouth daily.    [provider]  levothyroxine (SYNTHROID, LEVOTHROID) 137 MCG tablet Take 1 tablet (137 mcg total) by mouth daily before breakfast. 11/21/16   Terrance Mass, MD  losartan (COZAAR) 25 MG tablet Take 25 mg by mouth daily. (takes with 50 mg tab; total 20m daily)    [provider]  losartan (COZAAR) 50 MG tablet Take 50 mg by mouth daily. (takes with 25 mg tab; total 747mdaily)    [provider]  montelukast (SINGULAIR) 10 MG tablet Take 10 mg by mouth at bedtime.    [provider]  omeprazole (PRILOSEC) 40 MG capsule Take 1 capsule (40 mg total) by mouth daily. 03/02/21   DaDoran StablerMD  rifaximin (XIFAXAN) 550 MG TABS tablet Take 1 tablet (550 mg total) by mouth 2 (two) times daily. 04/28/21   DaDoran StablerMD  vitamin C (ASCORBIC ACID) 500 MG tablet Take 500 mg by mouth daily.    [provider]    Allergies    Patient has no known allergies.  Review of Systems   Review of Systems  Cardiovascular:  Positive for near-syncope.  All other  systems reviewed and are negative.  Physical Exam Updated Vital Signs BP 124/73   Pulse 68   Temp 98.5 F (36.9 C) (Oral)  Resp 14   Ht 5' 2"  (1.575 m)   Wt 68.9 kg   LMP 03/24/2017   SpO2 100%   BMI 27.78 kg/m   Physical Exam Vitals and nursing note reviewed.  Constitutional:      Appearance: She is well-developed.  HENT:     Head: Normocephalic and atraumatic.     Nose: No congestion or rhinorrhea.     Mouth/Throat:     Mouth: Mucous membranes are moist.     Pharynx: Oropharynx is clear.  Eyes:     Pupils: Pupils are equal, round, and reactive to light.  Cardiovascular:     Rate and Rhythm: Normal rate and regular rhythm.  Pulmonary:     Effort: Pulmonary effort is normal. No respiratory distress.     Breath sounds: No stridor.  Abdominal:     General: Abdomen is flat. There is no distension.  Musculoskeletal:        General: No tenderness (no msk ttp in right/left upper thigh, no lumbar/thoracic spine tttp).     Cervical back: Normal range of motion.  Skin:    General: Skin is warm and dry.  Neurological:     General: No focal deficit present.     Mental Status: She is alert.     Cranial Nerves: No cranial nerve deficit.     Sensory: No sensory deficit.     Motor: No weakness.     Gait: Gait normal.    ED Results / Procedures / Treatments   Labs (all labs ordered are listed, but only abnormal results are displayed) Labs Reviewed  COMPREHENSIVE METABOLIC PANEL - Abnormal; Notable for the following components:      Result Value   Potassium 3.4 (*)    Glucose, Bld 112 (*)    Calcium 8.6 (*)    All other components within normal limits  CBC WITH DIFFERENTIAL/PLATELET  CBG MONITORING, ED  I-STAT BETA HCG BLOOD, ED ([facility], [facility], [facility] ONLY)    EKG EKG Interpretation  Date/Time:  Saturday July 24 2021 19:33:12 EST Ventricular Rate:  69 PR Interval:  158 QRS Duration: 88 QT Interval:  420 QTC Calculation: 450 R Axis:   64 Text Interpretation: Normal  sinus rhythm Normal ECG Confirmed by Merrily Pew [phone]) on 07/25/2021 4:05:54 AM  Radiology DG Chest 2 View  Result Date: 07/24/2021 CLINICAL DATA:  Recent syncopal episode and shortness of breath EXAM: CHEST - 2 VIEW COMPARISON:  07/30/2010 FINDINGS: The heart size and mediastinal contours are within normal limits. Both lungs are clear. The visualized skeletal structures are unremarkable. IMPRESSION: No active cardiopulmonary disease. Electronically Signed   By: Inez Catalina M.D.   On: 07/24/2021 20:20    Procedures Procedures   Medications Ordered in ED Medications  methylPREDNISolone sodium succinate (SOLU-MEDROL) 125 mg/2 mL injection 125 mg (125 mg Intramuscular Given 07/25/21 0618)  ketorolac (TORADOL) injection 30 mg (30 mg Intramuscular Given 07/25/21 7322)    ED Course  I have reviewed the triage vital signs and the nursing notes.  Pertinent labs & imaging results that were available during my care of the patient were reviewed by me and considered in my medical decision making (see chart for details).    MDM Rules/Calculators/A&P                         Suspect sciatica and pain causing vasovagal syncope. Plan for treatment of same, will obtain PCP follow up outpatient to evaluate further. Pred taper  initiated.   Final Clinical Impression(s) / ED Diagnoses Final diagnoses:  Syncope and collapse  Bilateral sciatica    Rx / DC Orders ED Discharge Orders          Ordered    predniSONE (DELTASONE) 20 MG tablet        07/25/21 0656    meloxicam (MOBIC) 7.5 MG tablet  Daily        07/25/21 0656    pantoprazole (PROTONIX) 20 MG tablet  Daily        07/25/21 0656             Rocio Roam, Corene Cornea, MD 07/25/21 2313

## 2021-07-25 NOTE — ED Notes (Signed)
Patient discharge instructions reviewed with the patient. The patient verbalized understanding of instructions. Patient discharged.

## 2021-08-03 ENCOUNTER — Encounter: Payer: Self-pay | Admitting: Gastroenterology

## 2021-08-03 ENCOUNTER — Ambulatory Visit (INDEPENDENT_AMBULATORY_CARE_PROVIDER_SITE_OTHER): Payer: 59 | Admitting: Gastroenterology

## 2021-08-03 VITALS — BP 150/88 | HR 86 | Ht 62.0 in | Wt 151.4 lb

## 2021-08-03 DIAGNOSIS — K52839 Microscopic colitis, unspecified: Secondary | ICD-10-CM | POA: Diagnosis not present

## 2021-08-03 DIAGNOSIS — R140 Abdominal distension (gaseous): Secondary | ICD-10-CM | POA: Diagnosis not present

## 2021-08-03 MED ORDER — BUDESONIDE 3 MG PO CPEP: ORAL_CAPSULE | ORAL | 3 refills | Status: DC

## 2021-08-03 MED ORDER — DIPHENOXYLATE-ATROPINE 2.5-0.025 MG PO TABS: 1.0000 | ORAL_TABLET | Freq: Three times a day (TID) | ORAL | 2 refills | Status: DC | PRN

## 2021-08-03 NOTE — Patient Instructions (Addendum)
If you are age 48 or older, your body mass index should be between 23-30. Your Body mass index is 27.69 kg/m. If this is out of the aforementioned range listed, please consider follow up with your Primary Care Provider.  If you are age 9 or younger, your body mass index should be between 19-25. Your Body mass index is 27.69 kg/m. If this is out of the aformentioned range listed, please consider follow up with your Primary Care Provider.   ________________________________________________________  The  GI providers would like to encourage you to use [facility] to communicate with providers for non-urgent requests or questions.  Due to long hold times on the telephone, sending your provider a message by [facility] may be a faster and more efficient way to get a response.  Please allow 48 business hours for a response.  Please remember that this is for non-urgent requests.  _______________________________________________________  It was a pleasure to see you today!  Thank you for trusting me with your gastrointestinal care!    ___  ____________________________________________________  Food Guidelines for those with chronic digestive trouble:  Many people have difficulty digesting certain foods, causing a variety of distressing and embarrassing symptoms such as abdominal pain, bloating and gas.  These foods may need to be avoided or consumed in small amounts.  Here are some tips that might be helpful for you.  1.   Lactose intolerance is the difficulty or complete inability to digest lactose, the natural sugar in milk and anything made from milk.  This condition is harmless, common, and can begin any time during life.  Some people can digest a modest amount of lactose while others cannot tolerate any.  Also, not all dairy products contain equal amounts of lactose.  For example, hard cheeses such as parmesan have less lactose than soft cheeses such as cheddar.  Yogurt has less lactose than milk or  cheese.  Many packaged foods (even many brands of bread) have milk, so read ingredient lists carefully.  It is difficult to test for lactose intolerance, so just try avoiding lactose as much as possible for a week and see what happens with your symptoms.  If you seem to be lactose intolerant, the best plan is to avoid it (but make sure you get calcium from another source).  The next best thing is to use lactase enzyme supplements, available over the counter everywhere.  Just know that many lactose intolerant people need to take several tablets with each serving of dairy to avoid symptoms.  Lastly, a lot of restaurant food is made with milk or butter.  Many are things you might not suspect, such as mashed potatoes, rice and pasta (cooked with butter) and "grilled" items.  If you are lactose intolerant, it never hurts to ask your server what has milk or butter.  2.   Fiber is an important part of your diet, but not all fiber is well-tolerated.  Insoluble fiber such as bran is often consumed by normal gut bacteria and converted into gas.  Soluble fiber such as oats, squash, carrots and green beans are typically tolerated better.  3.   Some types of carbohydrates can be poorly digested.  Examples include: fructose (apples, cherries, pears, raisins and other dried fruits), fructans (onions, zucchini, large amounts of wheat), sorbitol/mannitol/xylitol and sucralose/Splenda (common artificial sweeteners), and raffinose (lentils, broccoli, cabbage, asparagus, brussel sprouts, many types of beans).  Do a Development worker, community for [employer] and you will find helpful information. Beano, a dietary supplement,  will often help with raffinose-containing foods.  As with lactase tablets, you may need several per serving.  4.   Whenever possible, avoid processed food&meats and chemical additives.  High fructose corn syrup, a common sweetener, may be difficult to digest.  Eggs and soy (comes from the soybean, and added to many foods  now) are other common bloating/gassy foods.  5.  Regarding gluten:  gluten is a protein mainly found in wheat, but also rye and barley.  There is a condition called celiac sprue, which is an inflammatory reaction in the small intestine causing a variety of digestive symptoms.  Blood testing is highly reliable to look for this condition, and sometimes upper endoscopy with small bowel biopsies may be necessary to make the diagnosis.  Many patients who test negative for celiac sprue report improvement in their digestive symptoms when they switch to a gluten-free diet.  However, in these "non-celiac gluten sensitive" patients, the true role of gluten in their symptoms is unclear.  Reducing carbohydrates in general may decrease the gas and bloating caused when gut bacteria consume carbs. Also, some of these patients may actually be intolerant of the baker's yeast in bread products rather than the gluten.  Flatbread and other reduced yeast breads might therefore be tolerated.  There is no specific testing available for most food intolerances, which are discovered mainly by dietary elimination.  Please do not embark on a gluten free diet unless directed by your doctor, as it is highly restrictive, and may lead to nutritional deficiencies if not carefully monitored.  Lastly, beware of internet claims offering "personalized" tests for food intolerances.  Such testing has no reliable scientific evidence to support its reliability and correlation to symptoms.    6.  The best advice is old advice, especially for those with chronic digestive trouble - try to eat "clean".  Balanced diet, avoid processed food, plenty of fruits and vegetables, cut down the sugar, minimal alcohol, avoid tobacco. Make time to care for yourself, get enough sleep, exercise when you can, reduce stress.  Your guts will thank you for it.   - Dr. Herma Ard  Gastroenterology  ____________________________________________________________

## 2021-08-03 NOTE — Progress Notes (Signed)
[facility] Progress Note  Chief Complaint: Chronic diarrhea  Subjective  History: From 04/28/2021 office visit assessment and plan: "Chronic diarrhea   This remains a puzzling case, her cause of diarrhea is still unclear.  The only abnormality on testing several years ago was a low fecal elastase, but she did not have risk factors for EPI and did not improve on a trial of pancreatic enzyme supplement.  I suspect this elastase level was artificially low/diluted by some other cause of watery diarrhea.  No microscopic colitis or upper small bowel disease such as sprue was found at that time. Difficult to know what to make of the "focal active colitis" noted above, but is not described as having features of microscopic colitis.  I believe the inflammation at the anastomosis is typical for the surgical anastomosis and not Crohn's disease.  Again, the deeply intubated neo-TI was normal.   Plan: Rifaximin 550 mg twice daily x14 days as empiric therapy for possible SIBO.   If not improved afterward, then a trial of budesonide in case these biopsy findings do represent microscopic colitis.   If not improved, trial of Viberzi."  ____________________________  Phone notes mid-October indicate patient did not feel better after completing trial of rifaximin.  Budesonide 9 mg once daily was prescribed.  Thedora is here with her daughter and an interpreter for the entire visit today.  She is improved since starting the budesonide, now with 2 soft stools most days, sometimes loose but much less than before.  Has not been taking Imodium or Lomotil, was not sure if she should and may also have run out.  She is about to run out of the budesonide as well. Complains of abdominal bloating as before, denies rectal bleeding.  Appetite generally good and weight stable.  ROS: Cardiovascular:  no chest pain Respiratory: no dyspnea  The patient's Past Medical, Family and Social History were reviewed and  are on file in the EMR.  Objective:  Med list reviewed  Current Outpatient Medications:    albuterol (PROVENTIL HFA;VENTOLIN HFA) 108 (90 Base) MCG/ACT inhaler, Inhale 1-2 puffs into the lungs every 6 (six) hours as needed for wheezing or shortness of breath., Disp: , Rfl:    aspirin EC 81 MG tablet, Take 81 mg by mouth daily., Disp: , Rfl:    calcium carbonate (OSCAL) 1500 (600 Ca) MG TABS tablet, Take 1,500 mg by mouth daily., Disp: , Rfl:    diphenoxylate-atropine (LOMOTIL) 2.5-0.025 MG tablet, Take 1 tablet by mouth 3 (three) times daily as needed for diarrhea or loose stools., Disp: 60 tablet, Rfl: 2   estradiol (ESTRACE) 1 MG tablet, Take 1 tablet (1 mg total) by mouth daily., Disp: 90 tablet, Rfl: 1   hydrochlorothiazide (HYDRODIURIL) 25 MG tablet, Take 25 mg by mouth daily., Disp: , Rfl:    levothyroxine (SYNTHROID, LEVOTHROID) 137 MCG tablet, Take 1 tablet (137 mcg total) by mouth daily before breakfast., Disp: 30 tablet, Rfl: 12   losartan (COZAAR) 25 MG tablet, Take 25 mg by mouth daily. (takes with 50 mg tab; total 75mg  daily), Disp: , Rfl:    losartan (COZAAR) 50 MG tablet, Take 50 mg by mouth daily. (takes with 25 mg tab; total 75mg  daily), Disp: , Rfl:    predniSONE (DELTASONE) 20 MG tablet, 3 tabs po daily x 3 days, then 2 tabs x 3 days, then 1.5 tabs x 3 days, then 1 tab x 3 days, then 0.5 tabs x 3 days, Disp: 27  tablet, Rfl: 0   rifaximin (XIFAXAN) 550 MG TABS tablet, Take 1 tablet (550 mg total) by mouth 2 (two) times daily., Disp: 28 tablet, Rfl: 0   vitamin C (ASCORBIC ACID) 500 MG tablet, Take 500 mg by mouth daily., Disp: , Rfl:    budesonide (ENTOCORT EC) 3 MG 24 hr capsule, Take 3 capsules (9 mg total) by mouth once daily., Disp: 90 capsule, Rfl: 3   meloxicam (MOBIC) 7.5 MG tablet, Take 1 tablet (7.5 mg total) by mouth daily for 10 days. (Patient not taking: Reported on 08/03/2021), Disp: 10 tablet, Rfl: 0   montelukast (SINGULAIR) 10 MG tablet, Take 10 mg by mouth at  bedtime. (Patient not taking: Reported on 08/03/2021), Disp: , Rfl:    omeprazole (PRILOSEC) 40 MG capsule, Take 1 capsule (40 mg total) by mouth daily. (Patient not taking: Reported on 08/03/2021), Disp: 90 capsule, Rfl: 1   Vital signs in last 24 hrs: Vitals:   08/03/21 1551  BP: (!) 150/88  Pulse: 86  SpO2: 99%   Wt Readings from Last 3 Encounters:  08/03/21 151 lb 6 oz (68.7 kg)  07/24/21 151 lb 14.4 oz (68.9 kg)  04/28/21 152 lb (68.9 kg)    Physical Exam  Overall looks well Cardiac: RRR without murmurs, S1S2 heard, no peripheral edema Pulm: clear to auscultation bilaterally, normal RR and effort noted Abdomen: soft, no tenderness, with active bowel sounds. No guarding or palpable hepatosplenomegaly.   Labs:   ___________________________________________ Radiologic studies:   ____________________________________________ Other:   _____________________________________________ Assessment & Plan  Assessment: Encounter Diagnoses  Name Primary?   Microscopic colitis, unspecified microscopic colitis type Yes   Abdominal bloating    It seems these nonspecific colon biopsy findings may represent microscopic colitis.  She is improved on budesonide 9 mg once daily.  Also recommended she can take Lomotil in addition if needed, and I refilled both medicines.  I had not tried Questran when I first saw her back in July of this year because I thought it may be logistically difficult to time that along with all her other medicines.  However, if this treatment no longer seems to be working, that would be the last remaining thing to try.  Some written dietary advice given, as there is probably some rapid transit and perhaps maldigestion since her surgery.  Does not appear to have been SIBO since no improvement treating that few years back or again recently  See me again in 2 months or sooner as needed   Nelida Meuse III

## 2021-10-01 ENCOUNTER — Ambulatory Visit (INDEPENDENT_AMBULATORY_CARE_PROVIDER_SITE_OTHER): Payer: 59 | Admitting: Gastroenterology

## 2021-10-01 ENCOUNTER — Encounter: Payer: Self-pay | Admitting: Gastroenterology

## 2021-10-01 VITALS — BP 136/90 | HR 68 | Ht 61.42 in | Wt 159.5 lb

## 2021-10-01 DIAGNOSIS — K529 Noninfective gastroenteritis and colitis, unspecified: Secondary | ICD-10-CM

## 2021-10-01 DIAGNOSIS — R140 Abdominal distension (gaseous): Secondary | ICD-10-CM

## 2021-10-01 NOTE — Progress Notes (Signed)
[facility] Progress Note  Chief Complaint: Chronic diarrhea  Subjective  History: From my 08/03/2021 office note: "It seems these nonspecific colon biopsy findings may represent microscopic colitis.  She is improved on budesonide 9 mg once daily.  Also recommended she can take Lomotil in addition if needed, and I refilled both medicines.  I had not tried Questran when I first saw her back in July of this year because I thought it may be logistically difficult to time that along with all her other medicines.  However, if this treatment no longer seems to be working, that would be the last remaining thing to try.   Some written dietary advice given, as there is probably some rapid transit and perhaps maldigestion since her surgery.   Does not appear to have been SIBO since no improvement treating that few years back or again recently   See me again in 2 months or sooner as needed" ____________________________________ More extensive summary of her history and my 04/28/2021 note.  Julia Green was here with her daughter and a Spanish interpreter today.  She is taking budesonide but not sure how much it is helping, she still has 2-3 loose watery stools per day without blood.  She has urgency bloating and gas.  Intermittent nausea without vomiting.  ROS: Cardiovascular:  no chest pain Respiratory: no dyspnea  The patient's Past Medical, Family and Social History were reviewed and are on file in the EMR.  Objective:  Med list reviewed  Current Outpatient Medications:    albuterol (PROVENTIL HFA;VENTOLIN HFA) 108 (90 Base) MCG/ACT inhaler, Inhale 1-2 puffs into the lungs every 6 (six) hours as needed for wheezing or shortness of breath., Disp: , Rfl:    aspirin EC 81 MG tablet, Take 81 mg by mouth daily., Disp: , Rfl:    budesonide (ENTOCORT EC) 3 MG 24 hr capsule, Take 3 capsules (9 mg total) by mouth once daily., Disp: 90 capsule, Rfl: 3   calcium carbonate (OSCAL) 1500 (600 Ca) MG  TABS tablet, Take 1,500 mg by mouth daily., Disp: , Rfl:    diphenoxylate-atropine (LOMOTIL) 2.5-0.025 MG tablet, Take 1 tablet by mouth 3 (three) times daily as needed for diarrhea or loose stools., Disp: 60 tablet, Rfl: 2   estradiol (ESTRACE) 1 MG tablet, Take 1 tablet (1 mg total) by mouth daily., Disp: 90 tablet, Rfl: 1   hydrochlorothiazide (HYDRODIURIL) 25 MG tablet, Take 25 mg by mouth daily., Disp: , Rfl:    levothyroxine (SYNTHROID) 125 MCG tablet, Take 125 mcg by mouth daily., Disp: , Rfl:    losartan (COZAAR) 50 MG tablet, Take 50 mg by mouth 2 (two) times daily., Disp: , Rfl:    montelukast (SINGULAIR) 10 MG tablet, Take 10 mg by mouth at bedtime., Disp: , Rfl:    omeprazole (PRILOSEC) 40 MG capsule, Take 1 capsule (40 mg total) by mouth daily., Disp: 90 capsule, Rfl: 1   vitamin C (ASCORBIC ACID) 500 MG tablet, Take 500 mg by mouth daily., Disp: , Rfl:    Vital signs in last 24 hrs: Vitals:   10/01/21 1518  BP: 136/90  Pulse: 68   Wt Readings from Last 3 Encounters:  10/01/21 159 lb 8 oz (72.3 kg)  08/03/21 151 lb 6 oz (68.7 kg)  07/24/21 151 lb 14.4 oz (68.9 kg)  Weight increased from last visit Physical Exam  Good muscle mass well-hydrated HEENT: sclera anicteric, oral mucosa moist without lesions Neck: supple, no thyromegaly, JVD or lymphadenopathy Cardiac:  RRR without murmurs, S1S2 heard, no peripheral edema Pulm: clear to auscultation bilaterally, normal RR and effort noted Abdomen: soft, no tenderness, with active bowel sounds. No guarding or palpable hepatosplenomegaly. Skin; warm and dry, no jaundice or rash  Labs:   ___________________________________________ Radiologic studies:   ____________________________________________ Other:   _____________________________________________ Assessment & Plan  Assessment: Encounter Diagnoses  Name Primary?   Chronic diarrhea Yes   Abdominal bloating    This remains a puzzle and no longer seems like  microscopic colitis.  Diarrhea certainly worse since her right hemicolectomy for suppurative appendicitis last year.  This may be severe IBS, so I will try her on Viberzi 100 mg tablet. Start 1 tablet daily for 3 days, increase to twice daily if still having loose stool with that. Enough samples given for about 10 days.  I would then like to hear from her on how it is working so we can decide whether or not to prescribe it.  Discontinue budesonide  If this treatment does not work, I plan to refer her to the [place] clinic for evaluation.  With extensive testing and treatment so far, it seems it may be IBS or a Brainard's-type diarrhea, though perhaps there is some unusual condition.   22 minutes were spent on this encounter (including chart review, history/exam, counseling/coordination of care, and documentation) > 50% of that time was spent on counseling and coordination of care.   Julia Green

## 2021-10-01 NOTE — Patient Instructions (Signed)
If you are age 49 or older, your body mass index should be between 23-30. Your Body mass index is 29.73 kg/m. If this is out of the aforementioned range listed, please consider follow up with your Primary Care Provider.  If you are age 54 or younger, your body mass index should be between 19-25. Your Body mass index is 29.73 kg/m. If this is out of the aformentioned range listed, please consider follow up with your Primary Care Provider.   ________________________________________________________  The [facility] providers would like to encourage you to use [facility] to communicate with providers for non-urgent requests or questions.  Due to long hold times on the telephone, sending your provider a message by [facility] may be a faster and more efficient way to get a response.  Please allow 48 business hours for a response.  Please remember that this is for non-urgent requests.  _______________________________________________________  Please start Viberzi 100 mg one a day for the first 3 days. If the diarrhea continues increase to twice a day.  Call with an update.  STOP the budesonide.   It was a pleasure to see you today!  Thank you for trusting me with your gastrointestinal care!

## 2021-10-28 ENCOUNTER — Telehealth: Payer: Self-pay | Admitting: Gastroenterology

## 2021-10-28 MED ORDER — VIBERZI 100 MG PO TABS: 1.0000 | ORAL_TABLET | Freq: Two times a day (BID) | ORAL | 5 refills | Status: DC

## 2021-10-28 NOTE — Telephone Encounter (Signed)
Viberzi rx faxed to pharmacy ?

## 2021-10-28 NOTE — Telephone Encounter (Signed)
Inbound call from patient's daughter stating Viberzi medication did help and is requesting script for it to be sent to [facility] on  [address] please. ?

## 2021-11-01 NOTE — Telephone Encounter (Signed)
PA with cover my meds has been sent ?

## 2021-11-02 NOTE — Telephone Encounter (Signed)
Julia Green (Key: [place]) [phone] ?Viberzi 100MG tablets ?    ?Status: PA Response - Approved ?

## 2021-11-16 ENCOUNTER — Telehealth: Payer: Self-pay | Admitting: Gastroenterology

## 2021-11-16 NOTE — Telephone Encounter (Signed)
Patient called states the medication below is too expensive for her. ?

## 2021-11-16 NOTE — Telephone Encounter (Signed)
Thank you for the note. ? ?It has been the only helpful medicine thus far to help control her diarrhea.  While I understand the cost issue, there is not an alternate and less expensive medicine in this class. ?I would appreciate you offering her any samples we have at present at either the 75 or 100 mg daily dose. ?Also, it would be helpful to connect her with any pharmaceutical company cost-reduction program that may be available. ? ?If none of that is feasible, then the only alternative is lomotil 2 tablets up to 4 times daily.  It has not been especially helpful in the past, but I do not know if she has ever tried it up to the maximum dosing. ? ?- HD ?

## 2021-11-16 NOTE — Telephone Encounter (Signed)
Dr Loletha Carrow please advise. I had to do a prior auth for this, but seems to cost to much for the patient.  ?

## 2021-11-18 NOTE — Telephone Encounter (Signed)
Called and spoke to the pharmacy.patient has a high co-pay as it is actually her deductible. I have attached a manufacture coupon and this doesn't help as its still her deductible.  ?We dont have any samples at this time of either the Viberzi 52m or 1058m ?I have printed the [employer] assistance program forms for patient to pick up and complete and see if we can go that direction. Per Dr DaLoletha Carrowhe only other alternative is Lomotil as instructed below.   ?

## 2021-11-19 ENCOUNTER — Ambulatory Visit (INDEPENDENT_AMBULATORY_CARE_PROVIDER_SITE_OTHER): Payer: 59 | Admitting: Obstetrics & Gynecology

## 2021-11-19 ENCOUNTER — Encounter: Payer: Self-pay | Admitting: Obstetrics & Gynecology

## 2021-11-19 ENCOUNTER — Other Ambulatory Visit (HOSPITAL_COMMUNITY)
Admission: RE | Admit: 2021-11-19 | Discharge: 2021-11-19 | Disposition: A | Discharge status: Home / Self Care | Payer: 59 | Source: Ambulatory Visit | Attending: Obstetrics & Gynecology | Admitting: Obstetrics & Gynecology

## 2021-11-19 VITALS — BP 110/70 | HR 68 | Resp 16 | Ht 61.25 in | Wt 159.0 lb

## 2021-11-19 DIAGNOSIS — Z1272 Encounter for screening for malignant neoplasm of vagina: Secondary | ICD-10-CM | POA: Diagnosis not present

## 2021-11-19 DIAGNOSIS — Z113 Encounter for screening for infections with a predominantly sexual mode of transmission: Secondary | ICD-10-CM

## 2021-11-19 DIAGNOSIS — R300 Dysuria: Secondary | ICD-10-CM

## 2021-11-19 DIAGNOSIS — N766 Ulceration of vulva: Secondary | ICD-10-CM | POA: Diagnosis not present

## 2021-11-19 DIAGNOSIS — Z01419 Encounter for gynecological examination (general) (routine) without abnormal findings: Secondary | ICD-10-CM | POA: Diagnosis not present

## 2021-11-19 LAB — URINALYSIS, COMPLETE W/RFL CULTURE
Bacteria, UA: NONE SEEN /HPF
Bilirubin Urine: NEGATIVE
Glucose, UA: NEGATIVE
Hgb urine dipstick: NEGATIVE
Hyaline Cast: NONE SEEN /LPF
Ketones, ur: NEGATIVE
Leukocyte Esterase: NEGATIVE
Nitrites, Initial: NEGATIVE
Protein, ur: NEGATIVE
RBC / HPF: NONE SEEN /HPF (ref 0–2)
Specific Gravity, Urine: 1.01 (ref 1.001–1.035)
WBC, UA: NONE SEEN /HPF (ref 0–5)
pH: 7 (ref 5.0–8.0)

## 2021-11-19 LAB — NO CULTURE INDICATED

## 2021-11-19 MED ORDER — VALACYCLOVIR HCL 1 G PO TABS: 1000.0000 mg | ORAL_TABLET | Freq: Two times a day (BID) | ORAL | 3 refills | Status: AC

## 2021-11-19 NOTE — Telephone Encounter (Signed)
Janett Billow called the patient on my behalf as she is spanish speaking. Explained the deductible and cost of the medication. She agrees to try and get the assistance. ? ? ?

## 2021-11-19 NOTE — Progress Notes (Addendum)
? ? ?svara twyman Oct 23, 1970 [id number] ? ? ?History:    51 y.o. G73P4L4 Married ?  ?RP:  Established patient presenting for annual gyn exam  ?  ?HPI: S/P LAVH.  Postmenopause well on HRT with Estradiol 1 mg tab PO daily.  C/O chronic recurrent burning when passing urine at the posterior vulva.  Also sometimes sees very mild blood when wiping.  No pelvic pain.  No pain with intercourse. Pap Neg 08/2019. Pap reflex today. Breasts normal. Mammo Neg 01/2019.  Mammo scheduled in 01/2022. Body mass index 29.8.  Not active physically on the regular basis. Health labs with family physician. COLONOSCOPY: 03-11-21. ?  ? ?Past medical history,surgical history, family history and social history were all reviewed and documented in the EPIC chart. ? ?Gynecologic History ?Patient's last menstrual period was 03/24/2017. ? ?Obstetric History ?OB History  ?Gravida Para Term Preterm AB Living  ?4 4       4   ?SAB IAB Ectopic Multiple Live Births  ?           ?  ?# Outcome Date GA Lbr Len/2nd Weight Sex Delivery Anes PTL Lv  ?4 Para           ?3 Para           ?2 Para           ?1 Para           ? ? ? ?ROS: A ROS was performed and pertinent positives and negatives are included in the history. ? GENERAL: No fevers or chills. HEENT: No change in vision, no earache, sore throat or sinus congestion. NECK: No pain or stiffness. CARDIOVASCULAR: No chest pain or pressure. No palpitations. PULMONARY: No shortness of breath, cough or wheeze. GASTROINTESTINAL: No abdominal pain, nausea, vomiting or diarrhea, melena or bright red blood per rectum. GENITOURINARY: No urinary frequency, urgency, hesitancy or dysuria. MUSCULOSKELETAL: No joint or muscle pain, no back pain, no recent trauma. DERMATOLOGIC: No rash, no itching, no lesions. ENDOCRINE: No polyuria, polydipsia, no heat or cold intolerance. No recent change in weight. HEMATOLOGICAL: No anemia or easy bruising or bleeding. NEUROLOGIC: No headache, seizures, numbness, tingling or  weakness. PSYCHIATRIC: No depression, no loss of interest in normal activity or change in sleep pattern.  ?  ? ?Exam: ? ? ?BP 110/70   Pulse 68   Resp 16   Ht 5' 1.25" (1.556 m)   Wt 159 lb (72.1 kg)   LMP 03/24/2017   BMI 29.80 kg/m?  ? ?Body mass index is 29.8 kg/m?. ? ?General appearance : Well developed well nourished female. No acute distress ?HEENT: Eyes: no retinal hemorrhage or exudates,  Neck supple, trachea midline, no carotid bruits, no thyroidmegaly ?Lungs: Clear to auscultation, no rhonchi or wheezes, or rib retractions  ?Heart: Regular rate and rhythm, no murmurs or gallops ?Breast:Examined in sitting and supine position were symmetrical in appearance, no palpable masses or tenderness,  no skin retraction, no nipple inversion, no nipple discharge, no skin discoloration, no axillary or supraclavicular lymphadenopathy ?Abdomen: no palpable masses or tenderness, no rebound or guarding ?Extremities: no edema or skin discoloration or tenderness ? ?Pelvic: Vulva: Ulcer at the posterior fourchette/perineum.  SureSwab done. ?            Vagina: No gross lesions or discharge.  Pap/HPV HR, Gono-Chlam done. ? Cervix/Uterus absent ? Adnexa  Without masses or tenderness ? Anus: Normal ? ?U/A completely negative ? ? ?Assessment/Plan:  51 y.o. female for  annual exam  ? ?1. Encounter for Papanicolaou smear of vagina as part of routine gynecological examination ?S/P LAVH. Postmenopause well on HRT with Estradiol 1 mg tab PO daily. C/O chronic recurrent burning when passing urine at the posterior vulva.  Also sometimes sees very mild blood when wiping.  No pelvic pain.  No pain with intercourse. Pap Neg 08/2019. Pap reflex today. Breasts normal. Mammo Neg 01/2019.  Mammo scheduled in 01/2022. Body mass index 29.8.  Not active physically on the regular basis.  Health labs with family physician. COLONOSCOPY: 03-11-21. ?- Cytology - PAP( [place]) ? ?2. Burning with urination ?U/A completely negative, no cystitis.   Burning probably at the vulvar/perineal ulcer. ?- Urinalysis,Complete w/RFL Culture ? ?3. Vulvar ulcer ?Posterior fourchette/perineal recurrent ulcer.  Ulcer present today.  HSV SureSwab done.  Counseling done. Will treat with Valacyclovir 1 g PO BID x 5 days.  Prescription sent to pharmacy. ?- SureSwab HSV, Type 1/2 DNA, PCR ? ?4. Screen for STD (sexually transmitted disease) ?- HIV antibody (with reflex) ?- RPR ?- Hepatitis B Surface AntiGEN ?- Hepatitis C Antibody ?- Cytology - PAP( [place]) HPV HR/Gono-Chlam ? ?Other orders ?- valACYclovir (VALTREX) 1000 MG tablet; Take 1 tablet (1,000 mg total) by mouth 2 (two) times daily for 5 days.  ?- Estradiol 1 mg PO daily ? ?Princess Bruins MD, 2:09 PM 11/19/2021 ? ?  ?

## 2021-11-22 LAB — RPR: RPR Ser Ql: NONREACTIVE

## 2021-11-22 LAB — SURESWAB HSV, TYPE 1/2 DNA, PCR
HSV 1 DNA: NOT DETECTED
HSV 2 DNA: NOT DETECTED

## 2021-11-22 LAB — HIV ANTIBODY (ROUTINE TESTING W REFLEX): HIV 1&2 Ab, 4th Generation: NONREACTIVE

## 2021-11-22 LAB — HEPATITIS C ANTIBODY
Hepatitis C Ab: NONREACTIVE
SIGNAL TO CUT-OFF: <0.02 (ref <1.00)

## 2021-11-22 LAB — HEPATITIS B SURFACE ANTIGEN: Hepatitis B Surface Ag: NONREACTIVE

## 2021-11-24 LAB — CYTOLOGY - PAP
Chlamydia: NEGATIVE
Comment: NEGATIVE
Comment: NEGATIVE
Comment: NORMAL
Diagnosis: NEGATIVE
High risk HPV: NEGATIVE
Neisseria Gonorrhea: NEGATIVE

## 2021-11-29 ENCOUNTER — Other Ambulatory Visit: Payer: Self-pay | Admitting: Anesthesiology

## 2021-11-29 ENCOUNTER — Telehealth: Payer: Self-pay

## 2021-11-29 MED ORDER — FLUCONAZOLE 150 MG PO TABS: 150.0000 mg | ORAL_TABLET | Freq: Once | ORAL | 0 refills | Status: AC

## 2021-11-29 NOTE — Telephone Encounter (Signed)
Patient called today because her Estradiol Rx was not sent in at [facility] 11/19/21 with Dr. Marguerita Merles .  Dr. Marguerita Merles office note said "HPI: S/P LAVH.  Postmenopause on no HRT."  But patient has been on Estradiol 1 mg. ? ?Telephone call 05/26/21" ?""Weston spoke with patient and patient requesting Rx for Estradiol 1 mg. She was previously on. Patient said menopausal sx have returned. S/P LAVH.  [facility] was 11/13/20."  Dr. Marguerita Merles wrote "Agree"  Rx was sent for Estradiol 1 mg. ? ?Ok to send Rx? ? ?

## 2021-11-30 MED ORDER — ESTRADIOL 1 MG PO TABS: 1.0000 mg | ORAL_TABLET | Freq: Every day | ORAL | 3 refills | Status: DC

## 2021-11-30 NOTE — Telephone Encounter (Signed)
Rx sent. Blanca informed. ?

## 2021-12-01 NOTE — Telephone Encounter (Signed)
Physician has completed his portion of the application. I have mailed her the packet to complete.  ?

## 2021-12-15 NOTE — Telephone Encounter (Signed)
All information for patient assistance has been sent by mail to [place] assistance program.  ?

## 2022-06-30 ENCOUNTER — Encounter: Payer: Self-pay | Admitting: Internal Medicine

## 2022-06-30 ENCOUNTER — Ambulatory Visit (INDEPENDENT_AMBULATORY_CARE_PROVIDER_SITE_OTHER): Payer: 59 | Admitting: Internal Medicine

## 2022-06-30 VITALS — BP 120/72 | HR 75 | Temp 98.3°F | Resp 20 | Ht 61.81 in | Wt 157.0 lb

## 2022-06-30 DIAGNOSIS — J310 Chronic rhinitis: Secondary | ICD-10-CM | POA: Diagnosis not present

## 2022-06-30 DIAGNOSIS — R053 Chronic cough: Secondary | ICD-10-CM | POA: Diagnosis not present

## 2022-06-30 DIAGNOSIS — J4530 Mild persistent asthma, uncomplicated: Secondary | ICD-10-CM | POA: Diagnosis not present

## 2022-06-30 MED ORDER — BUDESONIDE 0.25 MG/2ML IN SUSP: 0.2500 mg | Freq: Two times a day (BID) | RESPIRATORY_TRACT | 3 refills | Status: AC

## 2022-06-30 MED ORDER — ALBUTEROL SULFATE HFA 108 (90 BASE) MCG/ACT IN AERS: 1.0000 | INHALATION_SPRAY | Freq: Four times a day (QID) | RESPIRATORY_TRACT | 1 refills | Status: DC | PRN

## 2022-06-30 MED ORDER — FLUTICASONE PROPIONATE 50 MCG/ACT NA SUSP: 2.0000 | Freq: Every day | NASAL | 3 refills | Status: AC

## 2022-06-30 MED ORDER — ALBUTEROL SULFATE (2.5 MG/3ML) 0.083% IN NEBU: 2.5000 mg | INHALATION_SOLUTION | Freq: Four times a day (QID) | RESPIRATORY_TRACT | 1 refills | Status: DC | PRN

## 2022-06-30 NOTE — Progress Notes (Signed)
NEW PATIENT  Date of Service/Encounter:  06/30/22  Consult requested by: Cathleen Corti, PA-C   Subjective:   Julia Green (DOB: 1970/12/10) is a 51 y.o. female who presents to the clinic on 06/30/2022 with a chief complaint of Cough (Dry in the morning. Feels like her throat is going to close some times. Medications are not working (benzonatate), Allegra, saline nasal spray. Worse at night.) .    History obtained from: chart review and patient.   Asthma:  Reports being diagnosed around age 71 but since 04/2021 she has had worsening symptoms.  She is having a lot of coughing and trouble breathing.  She feels like she coughs so bad sometimes that her throat is irritated and feels like it closes. No clear triggers, it seems to happen all day and worse at nighttime.  She has never been on a controller medications. She is using her albuterol nebulizer almost daily.  Has received prednisone in the past for the symptoms without relief.   Limitations to daily activity: mild 0 ED visits and 1-2 oral steroids in the past year 1 number of lifetime hospitalizations, 0 number of lifetime intubations.  Identified Triggers: unsure  Prior PFTs or spirometry: never  Current regimen:  Rescue: Albuterol 2 puffs q4-6 hrs PRN  Rhinitis Ongoing for years.  Reports having a lot of congestion, drainage and mucous production.  Not much itchy watery eyes.  She has had some nose bleeds also.  Reports she was seen by ENT and referred to [place].   She has been using an OTC spray, not Afrin, twice daily.  Initially she told me it was Flonase but then she showed me a picture of the spray and it was similar to [place].   She also has tried Environmental education officer without much relief They have multiple dogs and cats that sleep with her in the bed/ No sinus surgeries No hx of allergy testing.   Past Medical History: Past Medical History:  Diagnosis Date   Arthritis    Asthma    Depression    Fatty liver     Gallstones    GERD (gastroesophageal reflux disease)    Headache    History of low potassium    Hypertension    Hypothyroidism    Right arm numbness    Right leg numbness    Sciatic nerve pain    Thyroid disease    Urticaria     Past Surgical History: Past Surgical History:  Procedure Laterality Date   CARPAL TUNNEL RELEASE Right 08/07/2019   Procedure: RIGHT CARPAL TUNNEL RELEASE;  Surgeon: Leandrew Koyanagi, MD;  Location: [facility];  Service: Orthopedics;  Laterality: Right;   COLONOSCOPY     LAPAROSCOPIC RIGHT HEMI COLECTOMY N/A 11/26/2020   Procedure: LAPAROSCOPIC RIGHT HEMI COLECTOMY;  Surgeon: Ileana Roup, MD;  Location: [facility];  Service: General;  Laterality: N/A;   LAPAROSCOPIC VAGINAL HYSTERECTOMY WITH SALPINGECTOMY Bilateral 06/26/2017   Procedure: LAPAROSCOPIC ASSISTED VAGINAL HYSTERECTOMY WITH SALPINGECTOMY;  Surgeon: Princess Bruins, MD;  Location: [facility];  Service: Gynecology;  Laterality: Bilateral;  request 7:30am OR time  requests 2 hours Rex the lighted retractor rep will be here    UPPER GI ENDOSCOPY      Family History: Family History  Problem Relation Age of Onset   Asthma Mother    Hypertension Mother    Uterine cancer Mother    Kidney failure Father    Lung disease Father  High blood pressure Father    Cancer Sister        unknown   Kidney failure Sister    Stomach cancer Maternal Aunt    Eczema Niece    Colon cancer Neg Hx    Esophageal cancer Neg Hx    Rectal cancer Neg Hx     Social History:  Pets: cat and dog Tobacco use/exposure: none   Medication List:  Allergies as of 06/30/2022   No Known Allergies      Medication List        Accurate as of June 30, 2022  4:07 PM. If you have any questions, ask your nurse or doctor.          albuterol 108 (90 Base) MCG/ACT inhaler Commonly known as: VENTOLIN HFA Inhale 1-2 puffs into the lungs every 6 (six) hours as needed for wheezing or shortness of  breath.   Allegra Allergy 180 MG tablet Generic drug: fexofenadine Take 1 tablet every day by oral route in the morning for 90 days.   ascorbic acid 500 MG tablet Commonly known as: VITAMIN C Take 500 mg by mouth daily.   aspirin EC 81 MG tablet Take 81 mg by mouth daily.   benzonatate 100 MG capsule Commonly known as: TESSALON TAKE 1 CAPSULE BY MOUTH THREE TIMES DAILY AS NEEDED   calcium carbonate 1500 (600 Ca) MG Tabs tablet Commonly known as: OSCAL Take 1,500 mg by mouth daily.   estradiol 1 MG tablet Commonly known as: ESTRACE Take 1 tablet (1 mg total) by mouth daily.   hydrochlorothiazide 25 MG tablet Commonly known as: HYDRODIURIL Take 25 mg by mouth daily.   levothyroxine 125 MCG tablet Commonly known as: SYNTHROID Take 125 mcg by mouth daily.   losartan 50 MG tablet Commonly known as: COZAAR Take 50 mg by mouth 2 (two) times daily.   montelukast 10 MG tablet Commonly known as: SINGULAIR Take 10 mg by mouth at bedtime.   omeprazole 40 MG capsule Commonly known as: PRILOSEC Take 1 capsule (40 mg total) by mouth daily.   Viberzi 100 MG Tabs Generic drug: Eluxadoline Take 1 tablet (100 mg total) by mouth in the morning and at bedtime.         REVIEW OF SYSTEMS: Pertinent positives and negatives discussed in HPI.   Objective:   Physical Exam: BP 120/72 (BP Location: Right Arm)   Pulse 75   Temp 98.3 F (36.8 C) (Temporal)   Resp 20   Ht 5' 1.81" (1.57 m)   Wt 157 lb (71.2 kg)   LMP 03/24/2017   SpO2 98%   BMI 28.89 kg/m  Body mass index is 28.89 kg/m. GEN: alert, well developed HEENT: clear conjunctiva, TM grey and translucent, nose with + inferior turbinate hypertrophy, pale nasal mucosa, slight clear rhinorrhea, + cobblestoning HEART: regular rate and rhythm, no murmur LUNGS: clear to auscultation bilaterally, no coughing, unlabored respiration ABDOMEN: soft, non distended  SKIN: no rashes or lesions  Reviewed: PCP visit 06/17/2022  for seasonal allergies and chronic cough.    Assessment:   No diagnosis found.  Plan/Recommendations:  Chronic Rhinitis - Use nasal saline rinses before nose sprays such as with Neilmed Sinus Rinse.  Use distilled water with it.   - Use Flonase 2 sprays each nostril daily. Aim upward and outward. - Hold all anti histamines until next visit. Will perform aeroallergen skin testing at next visit 1-59.    Mild Persistent Asthma Chronic Cough - Attempted spirometry today  but unable to get a good enough effort. Very poor technique.  - Maintenance inhaler: Pulmicort/Budesonide nebulizer 0.4m twice daily - Rescue inhaler: Albuterol 2 puffs via spacer or 1 vial via nebulizer every 4-6 hours as needed for respiratory symptoms of cough, shortness of breath, or wheezing Asthma control goals:  Full participation in all desired activities (may need albuterol before activity) Albuterol use two times or less a week on average (not counting use with activity) Cough interfering with sleep two times or less a month Oral steroids no more than once a year No hospitalizations  Return in about 5 days (around 07/05/2022). Skin testing at 4 PM      Return in about 5 days (around 07/05/2022).  PHarlon Flor MD [facility] [place]

## 2022-06-30 NOTE — Patient Instructions (Addendum)
Rhinitis: - Use nasal saline rinses before nose sprays such as with Neilmed Sinus Rinse.  Use distilled water with it.   - Use Flonase 2 sprays each nostril daily. Aim upward and outward. - Hold all anti histamines until next visit. Will perform aeroallergen skin testing at next visit 1-59.    Asthma: - Maintenance inhaler: Pulmicort/Budesonide nebulizer 0.27m twice daily - Rescue inhaler: Albuterol 2 puffs via spacer or 1 vial via nebulizer every 4-6 hours as needed for respiratory symptoms of cough, shortness of breath, or wheezing Asthma control goals:  Full participation in all desired activities (may need albuterol before activity) Albuterol use two times or less a week on average (not counting use with activity) Cough interfering with sleep two times or less a month Oral steroids no more than once a year No hospitalizations  Return in about 5 days (around 07/05/2022). Skin testing at 4 PM

## 2022-07-05 ENCOUNTER — Encounter: Payer: Self-pay | Admitting: Internal Medicine

## 2022-07-05 ENCOUNTER — Ambulatory Visit (INDEPENDENT_AMBULATORY_CARE_PROVIDER_SITE_OTHER): Payer: 59 | Admitting: Internal Medicine

## 2022-07-05 VITALS — BP 142/90 | HR 70 | Temp 98.3°F | Resp 20

## 2022-07-05 DIAGNOSIS — R053 Chronic cough: Secondary | ICD-10-CM

## 2022-07-05 DIAGNOSIS — J3089 Other allergic rhinitis: Secondary | ICD-10-CM

## 2022-07-05 DIAGNOSIS — H1013 Acute atopic conjunctivitis, bilateral: Secondary | ICD-10-CM

## 2022-07-05 DIAGNOSIS — J4530 Mild persistent asthma, uncomplicated: Secondary | ICD-10-CM | POA: Diagnosis not present

## 2022-07-05 MED ORDER — AZELASTINE HCL 0.1 % NA SOLN: 2.0000 | Freq: Two times a day (BID) | NASAL | 5 refills | Status: DC

## 2022-07-05 NOTE — Progress Notes (Signed)
FOLLOW UP Date of Service/Encounter:  07/05/22   Subjective:  Julia Green (DOB: Jul 14, 1971) is a 51 y.o. female who returns to the [facility] on 07/05/2022 for follow up for skin testing.  Also performed spirometry as we were unable to get her to do it last visit.    History obtained from: chart review and patient. Translator was present.  She brought all her medications today.  She is not using Flonase as it has caused her nose bleeds and was told by ENT to avoid it.  She has tried rinses without improvement.    She has not started the Pulmicort nebulizer.    Past Medical History: Past Medical History:  Diagnosis Date   Arthritis    Asthma    Depression    Fatty liver    Gallstones    GERD (gastroesophageal reflux disease)    Headache    History of low potassium    Hypertension    Hypothyroidism    Right arm numbness    Right leg numbness    Sciatic nerve pain    Thyroid disease    Urticaria     Objective:  BP (!) 142/90 (BP Location: Right Arm)   Pulse 70   Temp 98.3 F (36.8 C)   Resp 20   LMP 03/24/2017   SpO2 98%  There is no height or weight on file to calculate BMI. Physical Exam: GEN: alert, well developed HEENT: clear conjunctiva, MMM HEART: regular rate LUNGS: no coughing, unlabored respiration SKIN: no rashes or lesions  Spirometry:  Tracings reviewed. Her effort: Good reproducible efforts. FVC: 2.39L FEV1: 2.23L, 96% predicted FEV1/FVC ratio: 93% Interpretation: Spirometry consistent with normal pattern.  Please see scanned spirometry results for details.  Skin Testing:  Skin prick testing was placed, which includes aeroallergens/foods, histamine control, and saline control.  Verbal consent was obtained prior to placing test.  Patient tolerated procedure well.  Allergy testing results were read and interpreted by myself, documented by clinical staff. Adequate positive and negative control.  Positive results to:   Results discussed with patient/family.  Airborne Adult Perc - 07/05/22 1701     Time Antigen Placed 1641    Allergen Manufacturer Lavella Hammock    Location Back    Number of Test 59    1. Control-Buffer 50% Glycerol Negative    2. Control-Histamine 1 mg/ml 3+    3. Albumin saline Negative    4. Motley Negative    5. [place] Negative    6. Johnson Negative    7. Defiance Blue Negative    8. Meadow Fescue Negative    9. Perennial Rye Negative    10. Sweet Vernal Negative    11. Timothy Negative    12. Cocklebur Negative    13. Burweed Marshelder Negative    14. Ragweed, short Negative    15. Ragweed, Giant Negative    16. Plantain,  English Negative    17. Lamb's Quarters Negative    18. Sheep Sorrell Negative    19. Rough Pigweed Negative    20. Marsh Elder, Rough Negative    21. Mugwort, Common Negative    22. Ash mix Negative    23. Birch mix Negative    24. Beech American Negative    25. Box, Elder Negative    26. Cedar, red Negative    27. Cottonwood, [place] Negative    28. Elm mix Negative    29. Hickory Negative  30. Maple mix Negative    31. Oak, [place] mix Negative    32. Pecan Pollen Negative    33. Pine mix Negative    34. Sycamore Eastern Negative    35. [place], Black Pollen Negative    36. Alternaria alternata Negative    37. Cladosporium Herbarum Negative    38. Aspergillus mix Negative    39. Penicillium mix Negative    40. Bipolaris sorokiniana (Helminthosporium) Negative    41. Drechslera spicifera (Curvularia) Negative    42. Mucor plumbeus Negative    43. Fusarium moniliforme Negative    44. Aureobasidium pullulans (pullulara) Negative    45. Rhizopus oryzae Negative    46. Botrytis cinera Negative    47. Epicoccum nigrum Negative    48. Phoma betae Negative    49. Candida Albicans Negative    50. Trichophyton mentagrophytes Negative    51. Mite, D Farinae  5,000 AU/ml Negative    52. Mite, D Pteronyssinus  5,000 AU/ml Negative    53. Cat Hair  10,000 BAU/ml Negative    55. Mixed Feathers Negative    56. Horse Epithelia Negative    57. Cockroach, German Negative    58. Mouse Negative    59. 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  - Avoidance measures discussed. - Use nasal saline rinses before nose sprays such as with Neilmed Sinus Rinse.  Use distilled water.   - Has tried INCS in the past but had lots of nasal bleeding with this. - Use Azelastine 2 sprays each nostril twice daily. Aim upward and outward. - Use Zyrtec 10 mg daily.  - If no improvement, will need to refer back to ENT for further evaluation.    Mild Persistent Asthma: - Maintenance inhaler: start Pulmicort/Budesonide nebulizer 0.5m twice daily.  Spirometry today looked normal but will try to evaluate clinical response to ICS.  - Rescue inhaler: Albuterol 2 puffs via spacer or 1 vial via nebulizer every 4-6 hours as needed for respiratory symptoms of cough, shortness of breath, or wheezing Asthma control goals:  Full participation in all desired activities (may need albuterol before activity) Albuterol use two times or less a week on average (not counting use with activity) Cough interfering with sleep two times or less a month Oral steroids no more than once a year No hospitalizations   Return in about 2 months (around 09/04/2022). PHarlon Flor MD  [facility]

## 2022-07-05 NOTE — Patient Instructions (Addendum)
Rhinitis: - Positive skin test to: cockroach  - Avoidance measures discussed. - Use nasal saline rinses before nose sprays such as with Neilmed Sinus Rinse.  Use distilled water.   - Use Azelastine 2 sprays each nostril twice daily. Aim upward and outward. - Use Zyrtec 10 mg daily.   Asthma: - Maintenance inhaler: start Pulmicort/Budesonide nebulizer 0.62m twice daily - Rescue inhaler: Albuterol 2 puffs via spacer or 1 vial via nebulizer every 4-6 hours as needed for respiratory symptoms of cough, shortness of breath, or wheezing Asthma control goals:  Full participation in all desired activities (may need albuterol before activity) Albuterol use two times or less a week on average (not counting use with activity) Cough interfering with sleep two times or less a month Oral steroids no more than once a year No hospitalizations  ALLERGEN AVOIDANCE MEASURES   Cockroach Limit spread of food around the house; especially keep food out of bedrooms. Keep food and garbage in closed containers with a tight lid.  Never leave food out in the kitchen.  Do not leave out pet food or dirty food bowls. Mop the kitchen floor and wash countertops at least once a week. Repair leaky pipes and faucets so there is no standing water to attract roaches. Plug up cracks in the house through which cockroaches can enter. Use bait stations and approved pesticides to reduce cockroach infestation.   Return in about 2 months (around 09/04/2022).

## 2022-07-12 ENCOUNTER — Ambulatory Visit: Payer: 59 | Admitting: Family Medicine

## 2022-09-06 ENCOUNTER — Ambulatory Visit: Payer: 59 | Admitting: Internal Medicine

## 2023-02-22 ENCOUNTER — Encounter: Payer: Self-pay | Admitting: Obstetrics & Gynecology

## 2023-02-22 ENCOUNTER — Ambulatory Visit: Payer: PRIVATE HEALTH INSURANCE | Admitting: Obstetrics & Gynecology

## 2023-02-22 VITALS — BP 110/78 | HR 71 | Ht 61.0 in | Wt 153.0 lb

## 2023-02-22 DIAGNOSIS — Z79890 Hormone replacement therapy (postmenopausal): Secondary | ICD-10-CM | POA: Diagnosis not present

## 2023-02-22 DIAGNOSIS — Z01419 Encounter for gynecological examination (general) (routine) without abnormal findings: Secondary | ICD-10-CM | POA: Diagnosis not present

## 2023-02-22 DIAGNOSIS — Z90710 Acquired absence of both cervix and uterus: Secondary | ICD-10-CM

## 2023-02-22 MED ORDER — ESTRADIOL 1 MG PO TABS: 1.0000 mg | ORAL_TABLET | Freq: Every day | ORAL | 4 refills | Status: DC

## 2023-02-22 NOTE — Progress Notes (Signed)
Julia Green 1971/08/18 [id number]   History:    52 y.o. G4P4L4 Married   RP:  Established patient presenting for annual gyn exam    HPI: S/P LAVH.  Postmenopause well on HRT with Estradiol 1 mg tab PO daily. No pelvic pain.  No pain with intercourse. Pap Neg 10/2021. Repeat Pap at 5 years. Breasts normal. Mammo Neg 01/2022. Scheduled for repeat Mammo.  Body mass index 28.91.  Not active physically on a regular basis. Health labs with family physician. COLONOSCOPY: 03-11-21.    Past medical history,surgical history, family history and social history were all reviewed and documented in the EPIC chart.  Gynecologic History Patient's last menstrual period was 03/24/2017.  Obstetric History OB History  Gravida Para Term Preterm AB Living  4 4       4   SAB IAB Ectopic Multiple Live Births               # Outcome Date GA Lbr Len/2nd Weight Sex Delivery Anes PTL Lv  4 Para           3 Para           2 Para           1 Para              ROS: A ROS was performed and pertinent positives and negatives are included in the history. GENERAL: No fevers or chills. HEENT: No change in vision, no earache, sore throat or sinus congestion. NECK: No pain or stiffness. CARDIOVASCULAR: No chest pain or pressure. No palpitations. PULMONARY: No shortness of breath, cough or wheeze. GASTROINTESTINAL: No abdominal pain, nausea, vomiting or diarrhea, melena or bright red blood per rectum. GENITOURINARY: No urinary frequency, urgency, hesitancy or dysuria. MUSCULOSKELETAL: No joint or muscle pain, no back pain, no recent trauma. DERMATOLOGIC: No rash, no itching, no lesions. ENDOCRINE: No polyuria, polydipsia, no heat or cold intolerance. No recent change in weight. HEMATOLOGICAL: No anemia or easy bruising or bleeding. NEUROLOGIC: No headache, seizures, numbness, tingling or weakness. PSYCHIATRIC: No depression, no loss of interest in normal activity or change in sleep pattern.     Exam:   BP  110/78   Pulse 71   Ht 5\' 1"  (1.549 m)   Wt 153 lb (69.4 kg)   LMP 03/24/2017 Comment: not sexually active  SpO2 97%   BMI 28.91 kg/m   Body mass index is 28.91 kg/m.  General appearance : Well developed well nourished female. No acute distress HEENT: Eyes: no retinal hemorrhage or exudates,  Neck supple, trachea midline, no carotid bruits, no thyroidmegaly Lungs: Clear to auscultation, no rhonchi or wheezes, or rib retractions  Heart: Regular rate and rhythm, no murmurs or gallops Breast:Examined in sitting and supine position were symmetrical in appearance, no palpable masses or tenderness,  no skin retraction, no nipple inversion, no nipple discharge, no skin discoloration, no axillary or supraclavicular lymphadenopathy Abdomen: no palpable masses or tenderness, no rebound or guarding Extremities: no edema or skin discoloration or tenderness  Pelvic: Vulva: Normal             Vagina: No gross lesions or discharge  Cervix/Uterus absent  Adnexa  Without masses or tenderness  Anus: Normal   Assessment/Plan:  52 y.o. female for annual exam   1. Well female exam with routine gynecological exam S/P LAVH.  Postmenopause well on HRT with Estradiol 1 mg tab PO daily. No pelvic pain.  No pain with intercourse. Pap  Neg 10/2021. Repeat Pap at 5 years. Breasts normal. Mammo Neg 01/2022. Scheduled for repeat Mammo.  Body mass index 28.91.  Not active physically on a regular basis. Health labs with family physician. COLONOSCOPY: 03-11-21.  2. S/P laparoscopic assisted vaginal hysterectomy (LAVH)  3. Postmenopausal hormone replacement therapy S/P LAVH.  Postmenopause well on HRT with Estradiol 1 mg tab PO daily. No pelvic pain.  No pain with intercourse.  No CI to continue on Estradiol HRT.  Prescription sent to pharmacy.  Other orders - cetirizine-pseudoephedrine (ZYRTEC-D) 5-120 MG tablet; Take 1 tablet by mouth every 12 (twelve) hours. - albuterol (ACCUNEB) 1.25 MG/3ML nebulizer solution;  Take by nebulization every 6 (six) hours as needed. - levothyroxine (SYNTHROID) 112 MCG tablet; Take by mouth. - Omega-3 Fatty Acids (FISH OIL PO); Take by mouth. - estradiol (ESTRACE) 1 MG tablet; Take 1 tablet (1 mg total) by mouth daily.   Genia Del MD, 3:29 PM

## 2023-03-06 ENCOUNTER — Encounter: Payer: Self-pay | Admitting: Obstetrics & Gynecology

## 2023-03-09 ENCOUNTER — Encounter: Payer: Self-pay | Admitting: Emergency Medicine

## 2023-03-09 ENCOUNTER — Ambulatory Visit: Payer: PRIVATE HEALTH INSURANCE | Admitting: Emergency Medicine

## 2023-03-09 VITALS — BP 144/88 | HR 75 | Temp 98.7°F | Ht 61.0 in | Wt 155.2 lb

## 2023-03-09 DIAGNOSIS — I10 Essential (primary) hypertension: Secondary | ICD-10-CM | POA: Diagnosis not present

## 2023-03-09 DIAGNOSIS — F321 Major depressive disorder, single episode, moderate: Secondary | ICD-10-CM

## 2023-03-09 DIAGNOSIS — Z8709 Personal history of other diseases of the respiratory system: Secondary | ICD-10-CM

## 2023-03-09 DIAGNOSIS — E039 Hypothyroidism, unspecified: Secondary | ICD-10-CM

## 2023-03-09 DIAGNOSIS — Z7689 Persons encountering health services in other specified circumstances: Secondary | ICD-10-CM

## 2023-03-09 LAB — TSH: TSH: 2.06 u[IU]/mL (ref 0.35–5.50)

## 2023-03-09 LAB — CBC WITH DIFFERENTIAL/PLATELET
Basophils Absolute: 0.1 10*3/uL (ref 0.0–0.1)
Basophils Relative: 0.5 % (ref 0.0–3.0)
Eosinophils Absolute: 0.1 10*3/uL (ref 0.0–0.7)
Eosinophils Relative: 1.1 % (ref 0.0–5.0)
HCT: 39.1 % (ref 36.0–46.0)
Hemoglobin: 12.8 g/dL (ref 12.0–15.0)
Lymphocytes Relative: 28.5 % (ref 12.0–46.0)
Lymphs Abs: 3.2 10*3/uL (ref 0.7–4.0)
MCHC: 32.7 g/dL (ref 30.0–36.0)
MCV: 90.3 fl (ref 78.0–100.0)
Monocytes Absolute: 1.1 10*3/uL — ABNORMAL HIGH (ref 0.1–1.0)
Monocytes Relative: 10.1 % (ref 3.0–12.0)
Neutro Abs: 6.7 10*3/uL (ref 1.4–7.7)
Neutrophils Relative %: 59.8 % (ref 43.0–77.0)
Platelets: 295 10*3/uL (ref 150.0–400.0)
RBC: 4.33 Mil/uL (ref 3.87–5.11)
RDW: 14.1 % (ref 11.5–15.5)
WBC: 11.3 10*3/uL — ABNORMAL HIGH (ref 4.0–10.5)

## 2023-03-09 LAB — COMPREHENSIVE METABOLIC PANEL
ALT: 28 U/L (ref 0–35)
AST: 21 U/L (ref 0–37)
Albumin: 4.1 g/dL (ref 3.5–5.2)
Alkaline Phosphatase: 103 U/L (ref 39–117)
BUN: 18 mg/dL (ref 6–23)
CO2: 34 mEq/L — ABNORMAL HIGH (ref 19–32)
Calcium: 9.7 mg/dL (ref 8.4–10.5)
Chloride: 99 mEq/L (ref 96–112)
Creatinine, Ser: 0.76 mg/dL (ref 0.40–1.20)
GFR: 91.32 mL/min (ref >60.00)
Glucose, Bld: 95 mg/dL (ref 70–99)
Potassium: 3.1 mEq/L — ABNORMAL LOW (ref 3.5–5.1)
Sodium: 140 mEq/L (ref 135–145)
Total Bilirubin: 0.3 mg/dL (ref 0.2–1.2)
Total Protein: 7.4 g/dL (ref 6.0–8.3)

## 2023-03-09 LAB — LIPID PANEL
Cholesterol: 189 mg/dL (ref 0–200)
HDL: 86.7 mg/dL (ref >39.00)
LDL Cholesterol: 83 mg/dL (ref 0–99)
NonHDL: 102.77
Total CHOL/HDL Ratio: 2
Triglycerides: 100 mg/dL (ref 0.0–149.0)
VLDL: 20 mg/dL (ref 0.0–40.0)

## 2023-03-09 LAB — HEMOGLOBIN A1C: Hgb A1c MFr Bld: 5.7 % (ref 4.6–6.5)

## 2023-03-09 MED ORDER — ESCITALOPRAM OXALATE 10 MG PO TABS
10.0000 mg | ORAL_TABLET | Freq: Every day | ORAL | 2 refills | Status: DC
Start: 2023-03-09 — End: 2023-06-07

## 2023-03-09 NOTE — Patient Instructions (Signed)

## 2023-03-09 NOTE — Assessment & Plan Note (Signed)
Active and affecting quality of life.  Known trigger. Recommend to start Lexapro 10 mg daily May need to increase dose if tolerated. Stress management discussed

## 2023-03-09 NOTE — Assessment & Plan Note (Signed)
BP Readings from Last 3 Encounters:  03/09/23 (!) 144/88  02/22/23 110/78  07/05/22 (!) 142/90  Normal blood pressure readings at home Continue losartan 50 mg daily and hydrochlorothiazide 25 mg daily Cardiovascular risks associated with hypertension discussed Dietary approaches to stop hypertension discussed

## 2023-03-09 NOTE — Assessment & Plan Note (Signed)
Well-controlled.  No recent flareups Uses albuterol inhaler as rescue

## 2023-03-09 NOTE — Progress Notes (Signed)
Julia Green 52 y.o.   Chief Complaint  Patient presents with   New Patient (Initial Visit)    No concerns     HISTORY OF PRESENT ILLNESS: This is a 52 y.o. female first visit to this office, here to establish care with me. Has the following chronic medical history: 1.  Hypertension on losartan and hydrochlorothiazide 2.  Hypothyroidism on Synthroid 3.  Acute depression related to situation with older daughter 4.  History of asthma  HPI   Prior to Admission medications   Medication Sig Start Date End Date Taking? Authorizing Provider  albuterol (ACCUNEB) 1.25 MG/3ML nebulizer solution Take by nebulization every 6 (six) hours as needed.   Yes [provider]  aspirin EC 81 MG tablet Take 81 mg by mouth daily.   Yes [provider]  azelastine (ASTELIN) 0.1 % nasal spray Place 2 sprays into both nostrils 2 (two) times daily. Use in each nostril as directed 07/05/22  Yes Birder Robson, MD  calcium carbonate (OSCAL) 1500 (600 Ca) MG TABS tablet Take 1,500 mg by mouth daily.   Yes [provider]  cetirizine-pseudoephedrine (ZYRTEC-D) 5-120 MG tablet Take 1 tablet by mouth every 12 (twelve) hours. 05/30/22  Yes [provider]  estradiol (ESTRACE) 1 MG tablet Take 1 tablet (1 mg total) by mouth daily. 02/22/23  Yes Genia Del, MD  fluticasone (FLONASE) 50 MCG/ACT nasal spray Place 2 sprays into both nostrils daily. 06/30/22  Yes Birder Robson, MD  hydrochlorothiazide (HYDRODIURIL) 25 MG tablet Take 25 mg by mouth daily.   Yes [provider]  levothyroxine (SYNTHROID) 112 MCG tablet Take by mouth.   Yes [provider]  losartan (COZAAR) 50 MG tablet Take 50 mg by mouth 2 (two) times daily.   Yes [provider]  montelukast (SINGULAIR) 10 MG tablet Take 10 mg by mouth at bedtime.   Yes [provider]  Omega-3 Fatty Acids (FISH OIL PO) Take by mouth.   Yes [provider]  omeprazole  (PRILOSEC) 40 MG capsule Take 1 capsule (40 mg total) by mouth daily. 03/02/21  Yes Danis, Andreas Blower, MD  vitamin C (ASCORBIC ACID) 500 MG tablet Take 500 mg by mouth daily.   Yes [provider]  budesonide (PULMICORT) 0.25 MG/2ML nebulizer solution Take 2 mLs (0.25 mg total) by nebulization in the morning and at bedtime. Patient not taking: Reported on 03/09/2023 06/30/22   Birder Robson, MD    No Known Allergies  Patient Active Problem List   Diagnosis Date Noted   Hypertension    Acute appendicitis 11/26/2020   Colon cancer (HCC) 11/26/2020   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   Other specified hypothyroidism 11/16/2016    Past Medical History:  Diagnosis Date   Arthritis    Asthma    Depression    Fatty liver    Gallstones    GERD (gastroesophageal reflux disease)    Headache    History of low potassium    Hypertension    Hypothyroidism    Right arm numbness    Right leg numbness    Sciatic nerve pain    Thyroid disease    Urticaria     Past Surgical History:  Procedure Laterality Date   APPENDECTOMY     CARPAL TUNNEL RELEASE Right 08/07/2019   Procedure: RIGHT CARPAL TUNNEL RELEASE;  Surgeon: Tarry Kos, MD;  Location: [facility];  Service: Orthopedics;  Laterality: Right;   COLONOSCOPY     LAPAROSCOPIC RIGHT HEMI COLECTOMY N/A 11/26/2020   Procedure: LAPAROSCOPIC RIGHT HEMI COLECTOMY;  Surgeon: Andria Meuse, MD;  Location: [facility];  Service: General;  Laterality: N/A;   LAPAROSCOPIC VAGINAL HYSTERECTOMY WITH SALPINGECTOMY Bilateral 06/26/2017   Procedure: LAPAROSCOPIC ASSISTED VAGINAL HYSTERECTOMY WITH SALPINGECTOMY;  Surgeon: Genia Del, MD;  Location: [facility];  Service: Gynecology;  Laterality: Bilateral;  request 7:30am OR time  requests 2 hours Rex the lighted retractor rep will be here    UPPER GI ENDOSCOPY      Social History   Socioeconomic History   Marital status:  Divorced    Spouse name: Not on file   Number of children: 4   Years of education: Not on file   Highest education level: Not on file  Occupational History   Not on file  Tobacco Use   Smoking status: Never    Passive exposure: Never   Smokeless tobacco: Never  Vaping Use   Vaping status: Never Used  Substance and Sexual Activity   Alcohol use: No   Drug use: No   Sexual activity: Not Currently    Partners: Male    Birth control/protection: Surgical    Comment: first time sexual intercourse 52 years old, less than 5   Other Topics Concern   Not on file  Social History Narrative   Not on file   Social Determinants of Health   Financial Resource Strain: Not on file  Food Insecurity: Not on file  Transportation Needs: Not on file  Physical Activity: Not on file  Stress: Not on file  Social Connections: Unknown (01/03/2022)   Received from [facility]   Social Network    Social Network: Not on file  Intimate Partner Violence: Unknown (11/25/2021)   Received from [facility]   HITS    Physically Hurt: Not on file    Insult or Talk Down To: Not on file    Threaten Physical Harm: Not on file    Scream or Curse: Not on file    Family History  Problem Relation Age of Onset   Asthma Mother    Hypertension Mother    Uterine cancer Mother    Kidney failure Father    Lung disease Father    High blood pressure Father    Cancer Sister        unknown   Kidney failure Sister    Stomach cancer Maternal Aunt    Eczema Niece    Colon cancer Neg Hx    Esophageal cancer Neg Hx    Rectal cancer Neg Hx      Review of Systems  Constitutional: Negative.  Negative for chills and fever.  HENT: Negative.  Negative for congestion and sore throat.   Respiratory: Negative.  Negative for cough and shortness of breath.   Cardiovascular: Negative.  Negative for chest pain and palpitations.  Gastrointestinal:  Negative for abdominal pain, diarrhea, nausea and vomiting.   Genitourinary: Negative.  Negative for dysuria and hematuria.  Musculoskeletal:  Positive for joint pain and myalgias.  Skin: Negative.  Negative for rash.  Neurological: Negative.  Negative for dizziness and headaches.  Psychiatric/Behavioral:  Positive for depression.   All other systems reviewed and are negative.   Vitals:   03/09/23 1551  BP: (!) 144/88  Pulse: 75  Temp: 98.7 F (37.1 C)  SpO2: 97%    Physical Exam Vitals reviewed.  Constitutional:      Appearance: Normal  appearance. She is obese.  HENT:     Head: Normocephalic.     Right Ear: Tympanic membrane, ear canal and external ear normal.     Left Ear: Tympanic membrane, ear canal and external ear normal.     Mouth/Throat:     Mouth: Mucous membranes are moist.     Pharynx: Oropharynx is clear.  Eyes:     Extraocular Movements: Extraocular movements intact.     Conjunctiva/sclera: Conjunctivae normal.     Pupils: Pupils are equal, round, and reactive to light.  Cardiovascular:     Rate and Rhythm: Normal rate and regular rhythm.     Pulses: Normal pulses.     Heart sounds: Normal heart sounds.  Pulmonary:     Effort: Pulmonary effort is normal.     Breath sounds: Normal breath sounds.  Abdominal:     Palpations: Abdomen is soft.     Tenderness: There is no abdominal tenderness.  Musculoskeletal:     Cervical back: No tenderness.  Lymphadenopathy:     Cervical: No cervical adenopathy.  Skin:    General: Skin is warm and dry.     Capillary Refill: Capillary refill takes less than 2 seconds.  Neurological:     General: No focal deficit present.     Mental Status: She is alert and oriented to person, place, and time.  Psychiatric:        Mood and Affect: Mood normal.        Behavior: Behavior normal.      ASSESSMENT & PLAN: A total of 48 minutes was spent with the patient and counseling/coordination of care regarding preparing for this visit, review of available medical records, establishing care  with me, review of multiple chronic medical conditions under management, review of all medications and changes made, diagnosis and treatment of acute depression, comprehensive history and physical examination, need for blood work today, education on nutrition, prognosis, documentation and need for follow-up   Problem List Items Addressed This Visit       Cardiovascular and Mediastinum   Hypertension    BP Readings from Last 3 Encounters:  03/09/23 (!) 144/88  02/22/23 110/78  07/05/22 (!) 142/90  Normal blood pressure readings at home Continue losartan 50 mg daily and hydrochlorothiazide 25 mg daily Cardiovascular risks associated with hypertension discussed Dietary approaches to stop hypertension discussed       Relevant Orders   CBC with Differential/Platelet   Comprehensive metabolic panel   Hemoglobin A1c   Lipid panel     Endocrine   Hypothyroidism    Clinically euthyroid TSH done today Continue Synthroid 112 mcg daily      Relevant Orders   TSH     Other   History of asthma    Well-controlled.  No recent flareups Uses albuterol inhaler as rescue      Current moderate episode of major depressive disorder without prior episode (HCC) - Primary    Active and affecting quality of life.  Known trigger. Recommend to start Lexapro 10 mg daily May need to increase dose if tolerated. Stress management discussed      Relevant Medications   escitalopram (LEXAPRO) 10 MG tablet   Other Visit Diagnoses     Encounter to establish care          Patient Instructions  Mantenimiento de la salud en las mujeres Health Maintenance, Female Adoptar un estilo de vida saludable y recibir atencin preventiva son importantes para promover la salud y Counsellor. Consulte  al mdico sobre: El esquema adecuado para hacerse pruebas y exmenes peridicos. Cosas que puede hacer por su cuenta para prevenir enfermedades y [place] sano. Qu debo saber sobre la dieta, el peso y el  ejercicio? Consuma una dieta saludable  Consuma una dieta que incluya muchas verduras, frutas, productos lcteos con bajo contenido de [place] y Associate Professor. No consuma muchos alimentos ricos en grasas slidas, azcares agregados o sodio. Mantenga un peso saludable El ndice de masa muscular [facility]) se [place] para identificar problemas de [place]. Proporciona una estimacin de la grasa corporal basndose en el peso y la altura. Su mdico puede ayudarle a Engineer, site [facility] y a Personnel officer o Pharmacologist un peso saludable. Haga ejercicio con regularidad Haga ejercicio con regularidad. Esta es una de las prcticas ms importantes que puede hacer por su salud. La [employer] de los adultos deben seguir estas pautas: Education officer, environmental, al menos, 150 minutos de actividad fsica por semana. El ejercicio debe aumentar la frecuencia cardaca y Media planner transpirar (ejercicio de intensidad moderada). Hacer ejercicios de fortalecimiento por lo [employer] por semana. Agregue esto a su plan de ejercicio de intensidad moderada. Pase menos tiempo sentada. Incluso la actividad fsica ligera puede ser beneficiosa. Controle sus niveles de colesterol y lpidos en la sangre Comience a realizarse anlisis de lpidos y Oncologist en la sangre a los 20 aos y luego reptalos cada 5 aos. Hgase controlar los niveles de colesterol con mayor frecuencia si: Sus niveles de lpidos y colesterol son altos. Es mayor de 40 aos. Presenta un alto riesgo de padecer enfermedades cardacas. Qu debo saber sobre las pruebas de deteccin del cncer? Segn su historia clnica y sus antecedentes familiares, es posible que deba realizarse pruebas de deteccin del cncer en diferentes edades. Esto puede incluir pruebas de deteccin de lo siguiente: Cncer de mama. Cncer de cuello uterino. Cncer colorrectal. Cncer de piel. Cncer de pulmn. Qu debo saber sobre la enfermedad cardaca, la diabetes y la hipertensin arterial? Presin arterial y  enfermedad cardaca La hipertensin arterial causa enfermedades cardacas y [place] el riesgo de accidente cerebrovascular. Es ms probable que esto se manifieste en las personas que tienen lecturas de presin arterial alta o tienen sobrepeso. Hgase controlar la presin arterial: Cada 3 a 5 aos si tiene entre 18 y 11 aos. Todos los aos si es mayor de 40 aos. Diabetes Realcese exmenes de deteccin de la diabetes con regularidad. Este anlisis revisa el nivel de azcar en la sangre en [place]. Hgase las pruebas de deteccin: Cada tres aos despus de los 40 aos de edad si tiene un peso normal y un bajo riesgo de padecer diabetes. Con ms frecuencia y a partir de [place] edad inferior si tiene sobrepeso o un alto riesgo de padecer diabetes. Qu debo saber sobre la prevencin de infecciones? Hepatitis B Si tiene un riesgo ms alto de contraer hepatitis B, debe someterse a un examen de deteccin de este virus. Hable con el mdico para averiguar si tiene riesgo de contraer la infeccin por hepatitis B. Hepatitis C Se recomienda el anlisis a: [employer] 1945 y 1965. Todas las personas que tengan un riesgo de haber contrado hepatitis C. Enfermedades de transmisin sexual (ETS) Hgase las pruebas de Airline pilot de ITS, incluidas la gonorrea y la clamidia, si: Es sexualmente activa y es menor de [address]. Es mayor de [address], y Public affairs consultant informa que corre riesgo de tener este tipo de infecciones. La actividad sexual ha [place] desde [employer] hicieron  la ltima prueba de deteccin y tiene un riesgo mayor de tener clamidia o Copy. Pregntele al mdico si usted tiene riesgo. Pregntele al mdico si usted tiene un alto riesgo de Primary school teacher VIH. El mdico tambin puede recomendarle un medicamento recetado para ayudar a evitar la infeccin por el VIH. Si elige tomar medicamentos para prevenir el VIH, primero debe [employer] de deteccin del VIH. Luego debe hacerse anlisis  cada 3 meses mientras est tomando los medicamentos. Embarazo Si est por dejar de Armed forces training and education officer (fase premenopusica) y usted puede quedar [place], busque asesoramiento antes de [place]. [place] de 400 a 800 microgramos (mcg) de cido [employer] si [place]. Pida mtodos de control de la natalidad (anticonceptivos) si desea evitar un embarazo no deseado. Osteoporosis y [place] La osteoporosis es una enfermedad en la que los huesos pierden los minerales y la fuerza por el avance de la edad. El resultado pueden ser fracturas en los [place]. Si tiene 65 aos o ms, o si est en riesgo de sufrir osteoporosis y fracturas, pregunte a su mdico si debe: Hacerse pruebas de deteccin de prdida sea. Tomar un suplemento de calcio o de vitamina D para reducir el riesgo de fracturas. Recibir terapia de reemplazo hormonal (TRH) para tratar los sntomas de la menopausia. Siga estas indicaciones en su casa: Consumo de alcohol No beba alcohol si: Su mdico le indica no hacerlo. Est embarazada, puede estar embarazada o est tratando de [place]. Si bebe alcohol: Limite la cantidad que bebe a lo siguiente: De 0 a 1 bebida por da. Sepa cunta cantidad de alcohol hay en las bebidas que toma. En los [address], una medida equivale a una botella de cerveza de 12 oz (355 ml), un vaso de vino de 5 oz (148 ml) o un vaso de una bebida alcohlica de alta graduacin de 1 oz (44 ml). Estilo de vida No consuma ningn producto que contenga nicotina o tabaco. Estos productos incluyen cigarrillos, tabaco para Theatre manager y aparatos de vapeo, como los Administrator, Civil Service. Si necesita ayuda para dejar de consumir estos productos, consulte al mdico. No consuma drogas. No comparta agujas. Solicite ayuda a su mdico si necesita apoyo o informacin para abandonar las drogas. Indicaciones generales Realcese los estudios de rutina de [address], dentales y de Wellsite geologist. Mantngase al da  con las vacunas. Infrmele a su mdico si: Se siente deprimida con frecuencia. Alguna vez ha sido vctima de [place] o no se siente seguro en su casa. Resumen Adoptar un estilo de vida saludable y recibir atencin preventiva son importantes para promover la salud y Counsellor. Siga las instrucciones del mdico acerca de una dieta saludable, el ejercicio y la realizacin de pruebas o exmenes para Hotel manager. Siga las instrucciones del mdico con respecto al control del colesterol y la presin arterial. Esta informacin no tiene Theme park manager el consejo del mdico. Asegrese de hacerle al mdico cualquier pregunta que tenga. Document Revised: 01/14/2021 Document Reviewed: 01/14/2021 Elsevier Patient Education  2024 Elsevier Inc.     Edwina Barth, MD [place] [facility]

## 2023-03-09 NOTE — Assessment & Plan Note (Signed)
Clinically euthyroid.  TSH done today. ?Continue Synthroid 112 mcg daily. ?

## 2023-03-11 ENCOUNTER — Other Ambulatory Visit: Payer: Self-pay | Admitting: Emergency Medicine

## 2023-03-11 DIAGNOSIS — I10 Essential (primary) hypertension: Secondary | ICD-10-CM

## 2023-03-11 MED ORDER — LOSARTAN POTASSIUM-HCTZ 100-12.5 MG PO TABS
1.0000 | ORAL_TABLET | Freq: Every day | ORAL | 3 refills | Status: DC
Start: 2023-03-11 — End: 2024-04-16

## 2023-04-20 ENCOUNTER — Other Ambulatory Visit: Payer: Self-pay | Admitting: Emergency Medicine

## 2023-05-25 ENCOUNTER — Ambulatory Visit (INDEPENDENT_AMBULATORY_CARE_PROVIDER_SITE_OTHER): Payer: PRIVATE HEALTH INSURANCE | Admitting: Emergency Medicine

## 2023-05-25 ENCOUNTER — Encounter: Payer: Self-pay | Admitting: Emergency Medicine

## 2023-05-25 ENCOUNTER — Ambulatory Visit: Payer: PRIVATE HEALTH INSURANCE | Admitting: Emergency Medicine

## 2023-05-25 VITALS — BP 134/88 | HR 67 | Temp 98.4°F | Ht 61.0 in | Wt 154.1 lb

## 2023-05-25 DIAGNOSIS — J0100 Acute maxillary sinusitis, unspecified: Secondary | ICD-10-CM | POA: Insufficient documentation

## 2023-05-25 DIAGNOSIS — M2550 Pain in unspecified joint: Secondary | ICD-10-CM | POA: Diagnosis not present

## 2023-05-25 DIAGNOSIS — I10 Essential (primary) hypertension: Secondary | ICD-10-CM

## 2023-05-25 DIAGNOSIS — M790 Rheumatism, unspecified: Secondary | ICD-10-CM | POA: Insufficient documentation

## 2023-05-25 DIAGNOSIS — E876 Hypokalemia: Secondary | ICD-10-CM

## 2023-05-25 DIAGNOSIS — R0981 Nasal congestion: Secondary | ICD-10-CM | POA: Insufficient documentation

## 2023-05-25 DIAGNOSIS — E039 Hypothyroidism, unspecified: Secondary | ICD-10-CM

## 2023-05-25 MED ORDER — MELOXICAM 15 MG PO TABS
15.0000 mg | ORAL_TABLET | Freq: Every day | ORAL | 0 refills | Status: AC
Start: 2023-05-25 — End: 2023-06-04

## 2023-05-25 MED ORDER — AMOXICILLIN-POT CLAVULANATE 875-125 MG PO TABS
1.0000 | ORAL_TABLET | Freq: Two times a day (BID) | ORAL | 0 refills | Status: AC
Start: 2023-05-25 — End: 2023-06-01

## 2023-05-25 NOTE — Assessment & Plan Note (Signed)
Advised to use saline nasal sprays frequently during the day Advised to avoid over-the-counter decongestants due to hypertension

## 2023-05-25 NOTE — Assessment & Plan Note (Signed)
Active and affecting quality of life Recommend meloxicam 15 mg daily for 2 weeks Recommend rheumatology evaluation Referral placed today

## 2023-05-25 NOTE — Assessment & Plan Note (Signed)
Active and affecting quality of life Blood work including ANA and rheumatoid factor with sed rate done today Recommend meloxicam 15 mg daily for 2 weeks Recommend rheumatology evaluation Referral placed today

## 2023-05-25 NOTE — Assessment & Plan Note (Signed)
Clinically euthyroid Continue Synthroid 112 mcg daily

## 2023-05-25 NOTE — Assessment & Plan Note (Signed)
BP Readings from Last 3 Encounters:  05/25/23 134/88  03/09/23 (!) 144/88  02/22/23 110/78  Well-controlled hypertension Continue Hyzaar 100-12.5 mg daily

## 2023-05-25 NOTE — Assessment & Plan Note (Signed)
Most likely secondary to high-dose hydrochlorothiazide BMP repeated today

## 2023-05-25 NOTE — Assessment & Plan Note (Signed)
Recommend to start Augmentin 875 mg twice a day for 7 days

## 2023-05-25 NOTE — Progress Notes (Signed)
Julia Green 52 y.o.   Chief Complaint  Patient presents with   Joint Pain    Patient is having som joint pain off and on    Nasal Congestion    HISTORY OF PRESENT ILLNESS: This is a 52 y.o. female complaining of sinus congestion and headaches Also complaining of multiple joint pains No other complaints or medical concerns today. History of hypertension.  Recent blood work showed hypokalemia secondary to diuretic use.  Hydrochlorothiazide dose was reduced.  Needs repeat blood work today.  HPI   Prior to Admission medications   Medication Sig Start Date End Date Taking? Authorizing Provider  albuterol (ACCUNEB) 1.25 MG/3ML nebulizer solution Take by nebulization every 6 (six) hours as needed.   Yes [provider]  aspirin EC 81 MG tablet Take 81 mg by mouth daily.   Yes [provider]  azelastine (ASTELIN) 0.1 % nasal spray Place 2 sprays into both nostrils 2 (two) times daily. Use in each nostril as directed 07/05/22  Yes Birder Robson, MD  calcium carbonate (OSCAL) 1500 (600 Ca) MG TABS tablet Take 1,500 mg by mouth daily.   Yes [provider]  cetirizine-pseudoephedrine (ZYRTEC-D) 5-120 MG tablet Take 1 tablet by mouth every 12 (twelve) hours. 05/30/22  Yes [provider]  escitalopram (LEXAPRO) 10 MG tablet Take 1 tablet (10 mg total) by mouth daily. 03/09/23 06/07/23 Yes Glendora Clouatre, Eilleen Kempf, MD  estradiol (ESTRACE) 1 MG tablet Take 1 tablet (1 mg total) by mouth daily. 02/22/23  Yes Genia Del, MD  fluticasone (FLONASE) 50 MCG/ACT nasal spray Place 2 sprays into both nostrils daily. 06/30/22  Yes Birder Robson, MD  levothyroxine (SYNTHROID) 112 MCG tablet TAKE 1 TABLET BY MOUTH EVERY MORNING*RETURN TO CLINIC IN 6 WEEKS FOR TSH RECHECK 04/21/23  Yes Eriel Dunckel, Eilleen Kempf, MD  losartan-hydrochlorothiazide (HYZAAR) 100-12.5 MG tablet Take 1 tablet by mouth daily. 03/11/23  Yes Laramie Meissner, Eilleen Kempf, MD  montelukast (SINGULAIR)  10 MG tablet Take 10 mg by mouth at bedtime.   Yes [provider]  Omega-3 Fatty Acids (FISH OIL PO) Take by mouth.   Yes [provider]  omeprazole (PRILOSEC) 40 MG capsule TAKE 1 CAPSULE(40 MG) BY MOUTH DAILY 04/21/23  Yes Angel Hobdy, Eilleen Kempf, MD  vitamin C (ASCORBIC ACID) 500 MG tablet Take 500 mg by mouth daily.   Yes [provider]  budesonide (PULMICORT) 0.25 MG/2ML nebulizer solution Take 2 mLs (0.25 mg total) by nebulization in the morning and at bedtime. 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   Hypothyroidism 11/16/2016    Past Medical History:  Diagnosis Date   Arthritis    Asthma    Depression    Fatty liver    Gallstones    GERD (gastroesophageal reflux disease)    Headache    History of low potassium    Hypertension    Hypothyroidism    Right arm numbness    Right leg numbness    Sciatic nerve pain    Thyroid disease    Urticaria     Past Surgical History:  Procedure Laterality Date  APPENDECTOMY     CARPAL TUNNEL RELEASE Right 08/07/2019   Procedure: RIGHT CARPAL TUNNEL RELEASE;  Surgeon: Tarry Kos, MD;  Location: [facility];  Service: Orthopedics;  Laterality: Right;   COLONOSCOPY     LAPAROSCOPIC RIGHT HEMI COLECTOMY N/A 11/26/2020   Procedure: LAPAROSCOPIC RIGHT HEMI COLECTOMY;  Surgeon: Andria Meuse, MD;  Location: [facility];  Service: General;  Laterality: N/A;   LAPAROSCOPIC VAGINAL HYSTERECTOMY WITH SALPINGECTOMY Bilateral 06/26/2017   Procedure: LAPAROSCOPIC ASSISTED VAGINAL HYSTERECTOMY WITH SALPINGECTOMY;  Surgeon: Genia Del, MD;   Location: [facility];  Service: Gynecology;  Laterality: Bilateral;  request 7:30am OR time  requests 2 hours Rex the lighted retractor rep will be here    UPPER GI ENDOSCOPY      Social History   Socioeconomic History   Marital status: Divorced    Spouse name: Not on file   Number of children: 4   Years of education: Not on file   Highest education level: Not on file  Occupational History   Not on file  Tobacco Use   Smoking status: Never    Passive exposure: Never   Smokeless tobacco: Never  Vaping Use   Vaping status: Never Used  Substance and Sexual Activity   Alcohol use: No   Drug use: No   Sexual activity: Not Currently    Partners: Male    Birth control/protection: Surgical    Comment: first time sexual intercourse 52 years old, less than 5   Other Topics Concern   Not on file  Social History Narrative   Not on file   Social Determinants of Health   Financial Resource Strain: Not on file  Food Insecurity: Not on file  Transportation Needs: Not on file  Physical Activity: Not on file  Stress: Not on file  Social Connections: Unknown (01/03/2022)   [facility]   Social Network    Social Network: Not on file  Intimate Partner Violence: Unknown (11/25/2021)   Received from [facility]   HITS    Physically Hurt: Not on file    Insult or Talk Down To: Not on file    Threaten Physical Harm: Not on file    Scream or Curse: Not on file    Family History  Problem Relation Age of Onset   Asthma Mother    Hypertension Mother    Uterine cancer Mother    Kidney failure Father    Lung disease Father    High blood pressure Father    Cancer Sister        unknown   Kidney failure Sister    Stomach cancer Maternal Aunt    Eczema Niece    Colon cancer Neg Hx    Esophageal cancer Neg Hx    Rectal cancer Neg Hx      Review of Systems  Constitutional: Negative.  Negative for chills and fever.  HENT:  Positive for  congestion.   Respiratory: Negative.  Negative for cough and shortness of breath.   Cardiovascular: Negative.  Negative for chest pain and palpitations.  Gastrointestinal:  Negative for abdominal pain, diarrhea, nausea and vomiting.  Genitourinary:  Negative for dysuria.  Musculoskeletal:  Positive for joint pain.  Skin: Negative.  Negative for rash.  Neurological: Negative.  Negative for dizziness and headaches.  All other systems reviewed and are negative.   Today's Vitals   05/25/23 1600  BP: 134/88  Pulse: 67  Temp: 98.4 F (  36.9 C)  TempSrc: Oral  SpO2: 98%  Weight: 154 lb 2 oz (69.9 kg)  Height: 5\' 1"  (1.549 m)   Body mass index is 29.12 kg/m.   Physical Exam Vitals reviewed.  Constitutional:      Appearance: Normal appearance.  HENT:     Head: Normocephalic.     Right Ear: Tympanic membrane, ear canal and external ear normal.     Left Ear: Tympanic membrane, ear canal and external ear normal.     Nose: Congestion present.     Mouth/Throat:     Mouth: Mucous membranes are moist.     Pharynx: Oropharynx is clear.  Eyes:     Extraocular Movements: Extraocular movements intact.     Conjunctiva/sclera: Conjunctivae normal.     Pupils: Pupils are equal, round, and reactive to light.  Cardiovascular:     Rate and Rhythm: Normal rate and regular rhythm.     Pulses: Normal pulses.     Heart sounds: Normal heart sounds.  Pulmonary:     Effort: Pulmonary effort is normal.     Breath sounds: Normal breath sounds.  Abdominal:     Palpations: Abdomen is soft.     Tenderness: There is no abdominal tenderness.  Musculoskeletal:     Cervical back: No tenderness.  Lymphadenopathy:     Cervical: No cervical adenopathy.  Skin:    General: Skin is warm and dry.  Neurological:     Mental Status: She is alert and oriented to person, place, and time.  Psychiatric:        Mood and Affect: Mood normal.        Behavior: Behavior normal.     ASSESSMENT & PLAN: A total of  42 minutes was spent with the patient and counseling/coordination of care regarding preparing for this visit, review of most recent office visit notes, review of multiple chronic medical conditions under management, review of all medications, review of most recent blood work results, education on nutrition, pain management, need for rheumatology evaluation, prognosis, documentation and need for follow-up.  Problem List Items Addressed This Visit       Cardiovascular and Mediastinum   Hypertension    BP Readings from Last 3 Encounters:  05/25/23 134/88  03/09/23 (!) 144/88  02/22/23 110/78  Well-controlled hypertension Continue Hyzaar 100-12.5 mg daily       Relevant Orders   Comprehensive metabolic panel     Respiratory   Acute non-recurrent maxillary sinusitis    Recommend to start Augmentin 875 mg twice a day for 7 days      Relevant Medications   amoxicillin-clavulanate (AUGMENTIN) 875-125 MG tablet   Other Relevant Orders   CBC with Differential/Platelet   Sinus congestion    Advised to use saline nasal sprays frequently during the day Advised to avoid over-the-counter decongestants due to hypertension        Endocrine   Hypothyroidism    Clinically euthyroid Continue Synthroid 112 mcg daily        Musculoskeletal and Integument   Rheumatism    Active and affecting quality of life Recommend meloxicam 15 mg daily for 2 weeks Recommend rheumatology evaluation Referral placed today      Relevant Medications   meloxicam (MOBIC) 15 MG tablet   Other Relevant Orders   Ambulatory referral to Rheumatology     Other   Arthralgia of multiple sites - Primary    Active and affecting quality of life Blood work including ANA and rheumatoid factor with sed  rate done today Recommend meloxicam 15 mg daily for 2 weeks Recommend rheumatology evaluation Referral placed today      Relevant Medications   meloxicam (MOBIC) 15 MG tablet   Other Relevant Orders   ANA,IFA  RA Diag Pnl w/rflx Tit/Patn   Sedimentation rate   Ambulatory referral to Rheumatology   CBC with Differential/Platelet   Hypokalemia    Most likely secondary to high-dose hydrochlorothiazide BMP repeated today      Relevant Orders   Comprehensive metabolic panel   Patient Instructions  Mantenimiento de la salud en las mujeres Health Maintenance, Female Adoptar un estilo de vida saludable y recibir atencin preventiva son importantes para promover la salud y Counsellor. Consulte al mdico sobre: El esquema adecuado para hacerse pruebas y exmenes peridicos. Cosas que puede hacer por su cuenta para prevenir enfermedades y [place] sano. Qu debo saber sobre la dieta, el peso y el ejercicio? Consuma una dieta saludable  Consuma una dieta que incluya muchas verduras, frutas, productos lcteos con bajo contenido de [place] y Associate Professor. No consuma muchos alimentos ricos en grasas slidas, azcares agregados o sodio. Mantenga un peso saludable El ndice de masa muscular [facility]) se [place] para identificar problemas de [place]. Proporciona una estimacin de la grasa corporal basndose en el peso y la altura. Su mdico puede ayudarle a Engineer, site [facility] y a Personnel officer o Pharmacologist un peso saludable. Haga ejercicio con regularidad Haga ejercicio con regularidad. Esta es una de las prcticas ms importantes que puede hacer por su salud. La [employer] de los adultos deben seguir estas pautas: Education officer, environmental, al menos, 150 minutos de actividad fsica por semana. El ejercicio debe aumentar la frecuencia cardaca y Media planner transpirar (ejercicio de intensidad moderada). Hacer ejercicios de fortalecimiento por lo [employer] por semana. Agregue esto a su plan de ejercicio de intensidad moderada. Pase menos tiempo sentada. Incluso la actividad fsica ligera puede ser beneficiosa. Controle sus niveles de colesterol y lpidos en la sangre Comience a realizarse anlisis de lpidos y Oncologist en la sangre a  los 20 aos y luego reptalos cada 5 aos. Hgase controlar los niveles de colesterol con mayor frecuencia si: Sus niveles de lpidos y colesterol son altos. Es mayor de 40 aos. Presenta un alto riesgo de padecer enfermedades cardacas. Qu debo saber sobre las pruebas de deteccin del cncer? Segn su historia clnica y sus antecedentes familiares, es posible que deba realizarse pruebas de deteccin del cncer en diferentes edades. Esto puede incluir pruebas de deteccin de lo siguiente: Cncer de mama. Cncer de cuello uterino. Cncer colorrectal. Cncer de piel. Cncer de pulmn. Qu debo saber sobre la enfermedad cardaca, la diabetes y la hipertensin arterial? Presin arterial y enfermedad cardaca La hipertensin arterial causa enfermedades cardacas y [place] el riesgo de accidente cerebrovascular. Es ms probable que esto se manifieste en las personas que tienen lecturas de presin arterial alta o tienen sobrepeso. Hgase controlar la presin arterial: Cada 3 a 5 aos si tiene entre 18 y 24 aos. Todos los aos si es mayor de 40 aos. Diabetes Realcese exmenes de deteccin de la diabetes con regularidad. Este anlisis revisa el nivel de azcar en la sangre en [place]. Hgase las pruebas de deteccin: Cada tres aos despus de los 40 aos de edad si tiene un peso normal y un bajo riesgo de padecer diabetes. Con ms frecuencia y a partir de [place] edad inferior si tiene sobrepeso o un alto riesgo de padecer diabetes. Qu debo saber sobre la prevencin de infecciones?  Hepatitis B Si tiene un riesgo ms alto de contraer hepatitis B, debe someterse a un examen de deteccin de este virus. Hable con el mdico para averiguar si tiene riesgo de contraer la infeccin por hepatitis B. Hepatitis C Se recomienda el anlisis a: [employer] 1945 y 1965. Todas las personas que tengan un riesgo de haber contrado hepatitis C. Enfermedades de transmisin sexual (ETS) Hgase  las pruebas de Airline pilot de ITS, incluidas la gonorrea y la clamidia, si: Es sexualmente activa y es menor de [address]. Es mayor de [address], y Public affairs consultant informa que corre riesgo de tener este tipo de infecciones. La actividad sexual ha cambiado desde que le hicieron la ltima prueba de deteccin y tiene un riesgo mayor de Warehouse manager clamidia o Copy. Pregntele al mdico si usted tiene riesgo. Pregntele al mdico si usted tiene un alto riesgo de Primary school teacher VIH. El mdico tambin puede recomendarle un medicamento recetado para ayudar a evitar la infeccin por el VIH. Si elige tomar medicamentos para prevenir el VIH, primero debe [employer] de deteccin del VIH. Luego debe hacerse anlisis cada 3 meses mientras est tomando los medicamentos. Embarazo Si est por dejar de Armed forces training and education officer (fase premenopusica) y usted puede quedar [place], busque asesoramiento antes de [place]. [place] de 400 a 800 microgramos (mcg) de cido [employer] si [place]. Pida mtodos de control de la natalidad (anticonceptivos) si desea evitar un embarazo no deseado. Osteoporosis y [place] La osteoporosis es una enfermedad en la que los huesos pierden los minerales y la fuerza por el avance de la edad. El resultado pueden ser fracturas en los [place]. Si tiene 65 aos o ms, o si est en riesgo de sufrir osteoporosis y fracturas, pregunte a su mdico si debe: Hacerse pruebas de deteccin de prdida sea. Tomar un suplemento de calcio o de vitamina D para reducir el riesgo de fracturas. Recibir terapia de reemplazo hormonal (TRH) para tratar los sntomas de la menopausia. Siga estas indicaciones en su casa: Consumo de alcohol No beba alcohol si: Su mdico le indica no hacerlo. Est embarazada, puede estar embarazada o est tratando de [place]. Si bebe alcohol: Limite la cantidad que bebe a lo siguiente: De 0 a 1 bebida por da. Sepa cunta cantidad de alcohol hay en las bebidas  que toma. En los [address], una medida equivale a una botella de cerveza de 12 oz (355 ml), un vaso de vino de 5 oz (148 ml) o un vaso de una bebida alcohlica de alta graduacin de 1 oz (44 ml). Estilo de vida No consuma ningn producto que contenga nicotina o tabaco. Estos productos incluyen cigarrillos, tabaco para Theatre manager y aparatos de vapeo, como los Administrator, Civil Service. Si necesita ayuda para dejar de consumir estos productos, consulte al mdico. No consuma drogas. No comparta agujas. Solicite ayuda a su mdico si necesita apoyo o informacin para abandonar las drogas. Indicaciones generales Realcese los estudios de rutina de [address], dentales y de Wellsite geologist. Mantngase al da con las vacunas. Infrmele a su mdico si: Se siente deprimida con frecuencia. Alguna vez ha sido vctima de [place] o no se siente seguro en su casa. Resumen Adoptar un estilo de vida saludable y recibir atencin preventiva son importantes para promover la salud y Counsellor. Siga las instrucciones del mdico acerca de una dieta saludable, el ejercicio y la realizacin de pruebas o exmenes para Hotel manager. Siga las instrucciones del mdico con respecto al control  del colesterol y la presin arterial. Esta informacin no tiene Theme park manager el consejo del mdico. Asegrese de hacerle al mdico cualquier pregunta que tenga. Document Revised: 01/14/2021 Document Reviewed: 01/14/2021 Elsevier Patient Education  2024 Elsevier Inc.     Edwina Barth, MD [facility]

## 2023-05-25 NOTE — Patient Instructions (Signed)

## 2023-05-26 LAB — COMPREHENSIVE METABOLIC PANEL
ALT: 22 U/L (ref 0–35)
AST: 26 U/L (ref 0–37)
Albumin: 4 g/dL (ref 3.5–5.2)
Alkaline Phosphatase: 69 U/L (ref 39–117)
BUN: 11 mg/dL (ref 6–23)
CO2: 31 meq/L (ref 19–32)
Calcium: 9.1 mg/dL (ref 8.4–10.5)
Chloride: 99 meq/L (ref 96–112)
Creatinine, Ser: 0.83 mg/dL (ref 0.40–1.20)
GFR: 82.04 mL/min (ref >60.00)
Glucose, Bld: 105 mg/dL — ABNORMAL HIGH (ref 70–99)
Potassium: 3.5 meq/L (ref 3.5–5.1)
Sodium: 140 meq/L (ref 135–145)
Total Bilirubin: 0.2 mg/dL (ref 0.2–1.2)
Total Protein: 7.3 g/dL (ref 6.0–8.3)

## 2023-05-26 LAB — CBC WITH DIFFERENTIAL/PLATELET
Basophils Absolute: 0.1 10*3/uL (ref 0.0–0.1)
Basophils Relative: 1.3 % (ref 0.0–3.0)
Eosinophils Absolute: 0.2 10*3/uL (ref 0.0–0.7)
Eosinophils Relative: 2.1 % (ref 0.0–5.0)
HCT: 37 % (ref 36.0–46.0)
Hemoglobin: 12.5 g/dL (ref 12.0–15.0)
Lymphocytes Relative: 42.4 % (ref 12.0–46.0)
Lymphs Abs: 4 10*3/uL (ref 0.7–4.0)
MCHC: 33.8 g/dL (ref 30.0–36.0)
MCV: 88.3 fL (ref 78.0–100.0)
Monocytes Absolute: 1.1 10*3/uL — ABNORMAL HIGH (ref 0.1–1.0)
Monocytes Relative: 11.7 % (ref 3.0–12.0)
Neutro Abs: 4 10*3/uL (ref 1.4–7.7)
Neutrophils Relative %: 42.5 % — ABNORMAL LOW (ref 43.0–77.0)
Platelets: 329 10*3/uL (ref 150.0–400.0)
RBC: 4.19 Mil/uL (ref 3.87–5.11)
RDW: 13.5 % (ref 11.5–15.5)
WBC: 9.5 10*3/uL (ref 4.0–10.5)

## 2023-05-26 LAB — SEDIMENTATION RATE: Sed Rate: 24 mm/h (ref 0–30)

## 2023-05-28 LAB — ANTI-NUCLEAR AB-TITER (ANA TITER)
ANA TITER: 1:40 {titer} — ABNORMAL HIGH
ANA Titer 1: 1:40 {titer} — ABNORMAL HIGH

## 2023-05-28 LAB — ANA,IFA RA DIAG PNL W/RFLX TIT/PATN
Anti Nuclear Antibody (ANA): POSITIVE — AB
Cyclic Citrullin Peptide Ab: <16 U
Rheumatoid fact SerPl-aCnc: <10 [IU]/mL (ref <14)

## 2023-06-07 ENCOUNTER — Other Ambulatory Visit: Payer: Self-pay | Admitting: Emergency Medicine

## 2023-06-07 DIAGNOSIS — F321 Major depressive disorder, single episode, moderate: Secondary | ICD-10-CM

## 2023-06-15 ENCOUNTER — Encounter: Payer: Self-pay | Admitting: Emergency Medicine

## 2023-06-15 ENCOUNTER — Ambulatory Visit (INDEPENDENT_AMBULATORY_CARE_PROVIDER_SITE_OTHER): Payer: PRIVATE HEALTH INSURANCE | Admitting: Emergency Medicine

## 2023-06-15 VITALS — BP 132/86 | HR 73 | Temp 98.4°F | Ht 61.0 in | Wt 154.2 lb

## 2023-06-15 DIAGNOSIS — M790 Rheumatism, unspecified: Secondary | ICD-10-CM | POA: Diagnosis not present

## 2023-06-15 DIAGNOSIS — J329 Chronic sinusitis, unspecified: Secondary | ICD-10-CM

## 2023-06-15 DIAGNOSIS — B9689 Other specified bacterial agents as the cause of diseases classified elsewhere: Secondary | ICD-10-CM | POA: Insufficient documentation

## 2023-06-15 DIAGNOSIS — E039 Hypothyroidism, unspecified: Secondary | ICD-10-CM

## 2023-06-15 DIAGNOSIS — F321 Major depressive disorder, single episode, moderate: Secondary | ICD-10-CM | POA: Diagnosis not present

## 2023-06-15 DIAGNOSIS — M2550 Pain in unspecified joint: Secondary | ICD-10-CM | POA: Diagnosis not present

## 2023-06-15 DIAGNOSIS — I10 Essential (primary) hypertension: Secondary | ICD-10-CM

## 2023-06-15 MED ORDER — OMEPRAZOLE 40 MG PO CPDR: 40.0000 mg | DELAYED_RELEASE_CAPSULE | Freq: Every day | ORAL | 1 refills | Status: DC

## 2023-06-15 MED ORDER — MONTELUKAST SODIUM 10 MG PO TABS: 10.0000 mg | ORAL_TABLET | Freq: Every day | ORAL | 3 refills | Status: DC

## 2023-06-15 MED ORDER — DULOXETINE HCL 30 MG PO CPEP
30.0000 mg | ORAL_CAPSULE | Freq: Every day | ORAL | 3 refills | Status: DC
Start: 2023-06-15 — End: 2024-01-23

## 2023-06-15 NOTE — Assessment & Plan Note (Signed)
Chronic and affecting quality of life Chronic neuropathic pain Cymbalta may help.  Recommend to start 30 mg daily Has not been taking Lexapro for the past several weeks

## 2023-06-15 NOTE — Patient Instructions (Signed)

## 2023-06-15 NOTE — Progress Notes (Signed)
Julia Green 52 y.o.   Chief Complaint  Patient presents with   Follow-up    PT notes of continued sinus issues. Has finished round of antibiotics. Has not gotten better and is noting issues with dizziness. Would like to review last set of labs    HISTORY OF PRESENT ILLNESS: This is a 52 y.o. female with history of chronic sinus symptoms for the last 15 years States she has been seen by ENT and allergist in the past couple years.  No significant improvement in her symptoms. Complaining of chronic sinus congestion and intermittent dizziness Last office visit 05/25/2023.  Round of antibiotics did not help. Also complaining of chronic pain to multiple joints. Rheumatology workup yielded positive ANA.  Was referred to rheumatologist.  Appointment scheduled for next March. Also has history of chronic anxiety and depression recently triggered by daughter's situation. No other complaints or medical concerns today.   HPI   Prior to Admission medications   Medication Sig Start Date End Date Taking? Authorizing Provider  albuterol (ACCUNEB) 1.25 MG/3ML nebulizer solution Take by nebulization every 6 (six) hours as needed.   Yes [provider]  aspirin EC 81 MG tablet Take 81 mg by mouth daily.   Yes [provider]  azelastine (ASTELIN) 0.1 % nasal spray Place 2 sprays into both nostrils 2 (two) times daily. Use in each nostril as directed 07/05/22  Yes Patel, Priya P, MD  budesonide (PULMICORT) 0.25 MG/2ML nebulizer solution Take 2 mLs (0.25 mg total) by nebulization in the morning and at bedtime. 06/30/22  Yes Birder Robson, MD  calcium carbonate (OSCAL) 1500 (600 Ca) MG TABS tablet Take 1,500 mg by mouth daily.   Yes [provider]  cetirizine-pseudoephedrine (ZYRTEC-D) 5-120 MG tablet Take 1 tablet by mouth every 12 (twelve) hours. 05/30/22  Yes [provider]  escitalopram (LEXAPRO) 10 MG tablet TAKE 1 TABLET(10 MG) BY MOUTH DAILY 06/07/23   Yes Zayde Stroupe, Eilleen Kempf, MD  estradiol (ESTRACE) 1 MG tablet Take 1 tablet (1 mg total) by mouth daily. 02/22/23  Yes Genia Del, MD  fluticasone (FLONASE) 50 MCG/ACT nasal spray Place 2 sprays into both nostrils daily. 06/30/22  Yes Birder Robson, MD  levothyroxine (SYNTHROID) 112 MCG tablet TAKE 1 TABLET BY MOUTH EVERY MORNING*RETURN TO CLINIC IN 6 WEEKS FOR TSH RECHECK 04/21/23  Yes Aizah Gehlhausen, Eilleen Kempf, MD  losartan-hydrochlorothiazide (HYZAAR) 100-12.5 MG tablet Take 1 tablet by mouth daily. 03/11/23  Yes Jakeel Starliper, Eilleen Kempf, MD  montelukast (SINGULAIR) 10 MG tablet Take 10 mg by mouth at bedtime.   Yes [provider]  Omega-3 Fatty Acids (FISH OIL PO) Take by mouth.   Yes [provider]  omeprazole (PRILOSEC) 40 MG capsule TAKE 1 CAPSULE(40 MG) BY MOUTH DAILY 04/21/23  Yes Tea Collums, Eilleen Kempf, MD  vitamin C (ASCORBIC ACID) 500 MG tablet Take 500 mg by mouth daily.   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   Hypothyroidism 11/16/2016    Past Medical History:  Diagnosis  Date   Arthritis    Asthma    Depression    Fatty liver    Gallstones    GERD (gastroesophageal reflux disease)    Headache    History of low potassium    Hypertension    Hypothyroidism    Right arm numbness    Right leg numbness    Sciatic nerve pain    Thyroid disease    Urticaria     Past Surgical History:  Procedure Laterality Date   APPENDECTOMY     CARPAL TUNNEL RELEASE Right  08/07/2019   Procedure: RIGHT CARPAL TUNNEL RELEASE;  Surgeon: Tarry Kos, MD;  Location: [facility];  Service: Orthopedics;  Laterality: Right;   COLONOSCOPY     LAPAROSCOPIC RIGHT HEMI COLECTOMY N/A 11/26/2020   Procedure: LAPAROSCOPIC RIGHT HEMI COLECTOMY;  Surgeon: Andria Meuse, MD;  Location: [facility];  Service: General;  Laterality: N/A;   LAPAROSCOPIC VAGINAL HYSTERECTOMY WITH SALPINGECTOMY Bilateral 06/26/2017   Procedure: LAPAROSCOPIC ASSISTED VAGINAL HYSTERECTOMY WITH SALPINGECTOMY;  Surgeon: Genia Del, MD;  Location: [facility];  Service: Gynecology;  Laterality: Bilateral;  request 7:30am OR time  requests 2 hours Rex the lighted retractor rep will be here    UPPER GI ENDOSCOPY      Social History   Socioeconomic History   Marital status: Divorced    Spouse name: Not on file   Number of children: 4   Years of education: Not on file   Highest education level: Not on file  Occupational History   Not on file  Tobacco Use   Smoking status: Never    Passive exposure: Never   Smokeless tobacco: Never  Vaping Use   Vaping status: Never Used  Substance and Sexual Activity   Alcohol use: No   Drug use: No   Sexual activity: Not Currently    Partners: Male    Birth control/protection: Surgical    Comment: first time sexual intercourse 52 years old, less than 5   Other Topics Concern   Not on file  Social History Narrative   Not on file   Social Determinants of Health   Financial Resource Strain: Not on file  Food Insecurity: Not on file  Transportation Needs: Not on file  Physical Activity: Not on file  Stress: Not on file  Social Connections: Unknown (01/03/2022)   [facility]   Social Network    Social Network: Not on file  Intimate Partner Violence: Unknown (11/25/2021)   [facility] from [facility]   HITS    Physically Hurt: Not on file    Insult or Talk Down To: Not on file     Threaten Physical Harm: Not on file    Scream or Curse: Not on file    Family History  Problem Relation Age of Onset   Asthma Mother    Hypertension Mother    Uterine cancer Mother    Kidney failure Father    Lung disease Father    High blood pressure Father    Cancer Sister        unknown   Kidney failure Sister    Stomach cancer Maternal Aunt    Eczema Niece    Colon cancer Neg Hx    Esophageal cancer Neg Hx    Rectal cancer Neg Hx      Review of Systems  Constitutional: Negative.  Negative for chills and fever.  HENT: Negative.  Negative for congestion and  sore throat.   Respiratory: Negative.  Negative for cough and shortness of breath.   Cardiovascular: Negative.  Negative for chest pain and palpitations.  Gastrointestinal:  Negative for abdominal pain, nausea and vomiting.  Genitourinary: Negative.  Negative for dysuria and hematuria.  Skin: Negative.  Negative for rash.  Neurological: Negative.  Negative for dizziness and headaches.  All other systems reviewed and are negative.   Today's Vitals   06/15/23 1546  BP: 132/86  Pulse: 73  Temp: 98.4 F (36.9 C)  TempSrc: Oral  SpO2: 98%  Weight: 154 lb 3.2 oz (69.9 kg)  Height: 5\' 1"  (1.549 m)   Body mass index is 29.14 kg/m.   Physical Exam Vitals reviewed.  Constitutional:      Appearance: Normal appearance.  HENT:     Head: Normocephalic.  Eyes:     Extraocular Movements: Extraocular movements intact.  Cardiovascular:     Rate and Rhythm: Normal rate.  Pulmonary:     Effort: Pulmonary effort is normal.  Skin:    General: Skin is warm and dry.  Neurological:     Mental Status: She is alert and oriented to person, place, and time.  Psychiatric:        Mood and Affect: Mood normal.        Behavior: Behavior normal.      ASSESSMENT & PLAN: A total of 46 minutes was spent with the patient and counseling/coordination of care regarding preparing for this visit, review of most recent office visit  notes, review of most recent blood work results, review of multiple chronic medical conditions under management, review of all medications and changes made, stress management, prognosis, documentation, and need for follow-up.  Problem List Items Addressed This Visit       Cardiovascular and Mediastinum   Hypertension    BP Readings from Last 3 Encounters:  06/15/23 132/86  05/25/23 134/88  03/09/23 (!) 144/88  Well-controlled hypertension Continue Hyzaar 100-12.5 mg daily Cardiovascular risks associated with hypertension discussed         Respiratory   Chronic sinusitis - Primary    Chronic and affecting quality of life Recommend daily saline nasal spray as frequently as needed Needs ENT evaluation New referral placed today May need referral to allergist as well.      Relevant Medications   montelukast (SINGULAIR) 10 MG tablet   Other Relevant Orders   Ambulatory referral to ENT     Endocrine   Hypothyroidism    Clinically euthyroid Continue levothyroxine 112 mcg daily        Musculoskeletal and Integument   Rheumatism    Chronic and affecting quality of life Chronic neuropathic pain Cymbalta may help.  Recommend to start 30 mg daily Has not been taking Lexapro for the past several weeks      Relevant Medications   DULoxetine (CYMBALTA) 30 MG capsule     Other   Current moderate episode of major depressive disorder without prior episode (HCC)    Combined with generalized anxiety Known triggers Not presently taking Lexapro Recommend to start duloxetine 30 mg daily      Relevant Medications   DULoxetine (CYMBALTA) 30 MG capsule   Arthralgia of multiple sites    Scheduled to see rheumatologist March 2025 Pain management discussed May take Tylenol and or NSAIDs as needed Recommend to start Cymbalta 30 mg daily.      Relevant Medications   DULoxetine (CYMBALTA) 30 MG capsule   Patient Instructions  Mantenimiento de la salud  en las mujeres Health  Maintenance, Female Adoptar un estilo de vida saludable y recibir atencin preventiva son importantes para promover la salud y Counsellor. Consulte al mdico sobre: El esquema adecuado para hacerse pruebas y exmenes peridicos. Cosas que puede hacer por su cuenta para prevenir enfermedades y [place] sano. Qu debo saber sobre la dieta, el peso y el ejercicio? Consuma una dieta saludable  Consuma una dieta que incluya muchas verduras, frutas, productos lcteos con bajo contenido de [place] y Associate Professor. No consuma muchos alimentos ricos en grasas slidas, azcares agregados o sodio. Mantenga un peso saludable El ndice de masa muscular [facility]) se [place] para identificar problemas de [place]. Proporciona una estimacin de la grasa corporal basndose en el peso y la altura. Su mdico puede ayudarle a Engineer, site [facility] y a Personnel officer o Pharmacologist un peso saludable. Haga ejercicio con regularidad Haga ejercicio con regularidad. Esta es una de las prcticas ms importantes que puede hacer por su salud. La [employer] de los adultos deben seguir estas pautas: Education officer, environmental, al menos, 150 minutos de actividad fsica por semana. El ejercicio debe aumentar la frecuencia cardaca y Media planner transpirar (ejercicio de intensidad moderada). Hacer ejercicios de fortalecimiento por lo [employer] por semana. Agregue esto a su plan de ejercicio de intensidad moderada. Pase menos tiempo sentada. Incluso la actividad fsica ligera puede ser beneficiosa. Controle sus niveles de colesterol y lpidos en la sangre Comience a realizarse anlisis de lpidos y Oncologist en la sangre a los 20 aos y luego reptalos cada 5 aos. Hgase controlar los niveles de colesterol con mayor frecuencia si: Sus niveles de lpidos y colesterol son altos. Es mayor de 40 aos. Presenta un alto riesgo de padecer enfermedades cardacas. Qu debo saber sobre las pruebas de deteccin del cncer? Segn su historia clnica y sus antecedentes  familiares, es posible que deba realizarse pruebas de deteccin del cncer en diferentes edades. Esto puede incluir pruebas de deteccin de lo siguiente: Cncer de mama. Cncer de cuello uterino. Cncer colorrectal. Cncer de piel. Cncer de pulmn. Qu debo saber sobre la enfermedad cardaca, la diabetes y la hipertensin arterial? Presin arterial y enfermedad cardaca La hipertensin arterial causa enfermedades cardacas y [place] el riesgo de accidente cerebrovascular. Es ms probable que esto se manifieste en las personas que tienen lecturas de presin arterial alta o tienen sobrepeso. Hgase controlar la presin arterial: Cada 3 a 5 aos si tiene entre 18 y 49 aos. Todos los aos si es mayor de 40 aos. Diabetes Realcese exmenes de deteccin de la diabetes con regularidad. Este anlisis revisa el nivel de azcar en la sangre en [place]. Hgase las pruebas de deteccin: Cada tres aos despus de los 40 aos de edad si tiene un peso normal y un bajo riesgo de padecer diabetes. Con ms frecuencia y a partir de [place] edad inferior si tiene sobrepeso o un alto riesgo de padecer diabetes. Qu debo saber sobre la prevencin de infecciones? Hepatitis B Si tiene un riesgo ms alto de contraer hepatitis B, debe someterse a un examen de deteccin de este virus. Hable con el mdico para averiguar si tiene riesgo de contraer la infeccin por hepatitis B. Hepatitis C Se recomienda el anlisis a: [employer] 1943 y 1965. Todas las personas que tengan un riesgo de haber contrado hepatitis C. Enfermedades de transmisin sexual (ETS) Hgase las pruebas de Airline pilot de ITS, incluidas la gonorrea y la clamidia, si: Es sexualmente activa y es menor de [address]. Es Forensic scientist  de 24 aos, y Public affairs consultant informa que corre riesgo de tener este tipo de infecciones. La actividad sexual ha cambiado desde que le hicieron la ltima prueba de deteccin y tiene un riesgo mayor de Warehouse manager clamidia o  Copy. Pregntele al mdico si usted tiene riesgo. Pregntele al mdico si usted tiene un alto riesgo de Primary school teacher VIH. El mdico tambin puede recomendarle un medicamento recetado para ayudar a evitar la infeccin por el VIH. Si elige tomar medicamentos para prevenir el VIH, primero debe [employer] de deteccin del VIH. Luego debe hacerse anlisis cada 3 meses mientras est tomando los medicamentos. Embarazo Si est por dejar de Armed forces training and education officer (fase premenopusica) y usted puede quedar [place], busque asesoramiento antes de [place]. [place] de 400 a 800 microgramos (mcg) de cido [employer] si [place]. Pida mtodos de control de la natalidad (anticonceptivos) si desea evitar un embarazo no deseado. Osteoporosis y [place] La osteoporosis es una enfermedad en la que los huesos pierden los minerales y la fuerza por el avance de la edad. El resultado pueden ser fracturas en los [place]. Si tiene 65 aos o ms, o si est en riesgo de sufrir osteoporosis y fracturas, pregunte a su mdico si debe: Hacerse pruebas de deteccin de prdida sea. Tomar un suplemento de calcio o de vitamina D para reducir el riesgo de fracturas. Recibir terapia de reemplazo hormonal (TRH) para tratar los sntomas de la menopausia. Siga estas indicaciones en su casa: Consumo de alcohol No beba alcohol si: Su mdico le indica no hacerlo. Est embarazada, puede estar embarazada o est tratando de [place]. Si bebe alcohol: Limite la cantidad que bebe a lo siguiente: De 0 a 1 bebida por da. Sepa cunta cantidad de alcohol hay en las bebidas que toma. En los [address], una medida equivale a una botella de cerveza de 12 oz (355 ml), un vaso de vino de 5 oz (148 ml) o un vaso de una bebida alcohlica de alta graduacin de 1 oz (44 ml). Estilo de vida No consuma ningn producto que contenga nicotina o tabaco. Estos productos incluyen cigarrillos, tabaco para Theatre manager y aparatos  de vapeo, como los Administrator, Civil Service. Si necesita ayuda para dejar de consumir estos productos, consulte al mdico. No consuma drogas. No comparta agujas. Solicite ayuda a su mdico si necesita apoyo o informacin para abandonar las drogas. Indicaciones generales Realcese los estudios de rutina de [address], dentales y de Wellsite geologist. Mantngase al da con las vacunas. Infrmele a su mdico si: Se siente deprimida con frecuencia. Alguna vez ha sido vctima de [place] o no se siente seguro en su casa. Resumen Adoptar un estilo de vida saludable y recibir atencin preventiva son importantes para promover la salud y Counsellor. Siga las instrucciones del mdico acerca de una dieta saludable, el ejercicio y la realizacin de pruebas o exmenes para Hotel manager. Siga las instrucciones del mdico con respecto al control del colesterol y la presin arterial. Esta informacin no tiene Theme park manager el consejo del mdico. Asegrese de hacerle al mdico cualquier pregunta que tenga. Document Revised: 01/14/2021 Document Reviewed: 01/14/2021 Elsevier Patient Education  2024 Elsevier Inc.     Edwina Barth, MD [facility]

## 2023-06-15 NOTE — Assessment & Plan Note (Signed)
Scheduled to see rheumatologist March 2025 Pain management discussed May take Tylenol and or NSAIDs as needed Recommend to start Cymbalta 30 mg daily.

## 2023-06-15 NOTE — Assessment & Plan Note (Signed)
Chronic and affecting quality of life Recommend daily saline nasal spray as frequently as needed Needs ENT evaluation New referral placed today May need referral to allergist as well.

## 2023-06-15 NOTE — Assessment & Plan Note (Signed)
Clinically euthyroid. Continue levothyroxine 112 mcg daily.

## 2023-06-15 NOTE — Assessment & Plan Note (Signed)
Combined with generalized anxiety Known triggers Not presently taking Lexapro Recommend to start duloxetine 30 mg daily

## 2023-06-15 NOTE — Assessment & Plan Note (Signed)
BP Readings from Last 3 Encounters:  06/15/23 132/86  05/25/23 134/88  03/09/23 (!) 144/88  Well-controlled hypertension Continue Hyzaar 100-12.5 mg daily Cardiovascular risks associated with hypertension discussed

## 2023-07-04 ENCOUNTER — Other Ambulatory Visit: Payer: Self-pay | Admitting: Emergency Medicine

## 2023-07-04 DIAGNOSIS — F321 Major depressive disorder, single episode, moderate: Secondary | ICD-10-CM

## 2023-07-17 ENCOUNTER — Other Ambulatory Visit: Payer: Self-pay

## 2023-11-01 NOTE — Progress Notes (Signed)
 Office Visit Note  Patient: Julia Green             Date of Birth: Feb 01, 1971           MRN: [id number]             PCP: Georgina Quint, MD Referring: Georgina Quint, * Visit Date: 11/15/2023 Occupation: @GUAROCC @  Interpreter: Cathi Roan  Subjective:  Pain in joints and muscles   History of Present Illness: Julia Green is a 53 y.o. female seen in consultation for the evaluation of positive ANA and polyarthralgia.  According to the patient her symptoms have been going on for the last 8 years.  She has been having neck and lower back pain for many years.  She has been evaluated by orthopedics in the past and had x-rays.  She states she was advised surgery on the lumbar spine which she did not want to undergo surgery.  She had physical therapy which did not help much.  She continues to have pain and discomfort in her neck, lower back, shoulders, elbows, wrist, hands, hips, knees, ankles and feet.  She notices intermittent swelling in her hands.  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  When she is doing activities the pain is more.  She gives history of fatigue, oral ulcers, arthralgias.  There is no history of inflammatory arthritis.  There is no history of sicca symptoms, malar rash, photosensitivity, Raynaud's or lymphadenopathy.  There is no family history of autoimmune disease.  She is right-handed.  She is gravida 5, para 4, abortion 1.  There is no history of preeclampsia or DVTs.  She does not drink any alcohol or smoke.    Activities of Daily Living:  Patient reports morning stiffness for 24 hours.   Patient Reports nocturnal pain.  Difficulty dressing/grooming: Reports Difficulty climbing stairs: Reports Difficulty getting out of chair: Reports Difficulty using hands for taps, buttons, cutlery, and/or writing: Reports  Review of Systems  Constitutional:  Positive for fatigue.  HENT:  Positive for mouth sores. Negative for  mouth dryness.   Eyes:  Negative for dryness.  Respiratory:  Negative for difficulty breathing.   Cardiovascular:  Positive for palpitations. Negative for chest pain.  Gastrointestinal:  Positive for constipation. Negative for blood in stool and diarrhea.  Endocrine: Negative for increased urination.  Genitourinary:  Negative for involuntary urination.  Musculoskeletal:  Positive for joint pain, joint pain, joint swelling, myalgias, morning stiffness, muscle tenderness and myalgias. Negative for gait problem and muscle weakness.  Skin:  Positive for hair loss. Negative for color change, rash and sensitivity to sunlight.  Allergic/Immunologic: Negative for susceptible to infections.  Neurological:  Positive for dizziness and headaches.  Hematological:  Negative for swollen glands.  Psychiatric/Behavioral:  Positive for sleep disturbance. Negative for depressed mood. The patient is nervous/anxious.     PMFS History:  Patient Active Problem List   Diagnosis Date Noted   Chronic sinusitis 06/15/2023   Arthralgia of multiple sites 05/25/2023   Hypokalemia 05/25/2023   Rheumatism 05/25/2023   History of asthma 03/09/2023   Current moderate episode of major depressive disorder without prior episode (HCC) 03/09/2023   Hypertension    Colon cancer (HCC) 11/26/2020   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   Hypothyroidism 11/16/2016    Past Medical History:  Diagnosis Date   Arthritis    Asthma    Depression    Fatty  liver    Gallstones    GERD (gastroesophageal reflux disease)    Headache    History of low potassium    Hypertension    Hypothyroidism    Right arm numbness    Right leg numbness    Sciatic nerve pain    Thyroid disease    Urticaria     Family History  Problem Relation Age of Onset   Asthma Mother    Hypertension Mother    Uterine cancer Mother    Kidney failure Father    Lung disease Father    High blood pressure Father     Cancer Sister        unknown   Kidney failure Sister    Stomach cancer Maternal Aunt    Eczema Niece    Colon cancer Neg Hx    Esophageal cancer Neg Hx    Rectal cancer Neg Hx    Past Surgical History:  Procedure Laterality Date   APPENDECTOMY     CARPAL TUNNEL RELEASE Right 08/07/2019   Procedure: RIGHT CARPAL TUNNEL RELEASE;  Surgeon: Tarry Kos, MD;  Location: [facility];  Service: Orthopedics;  Laterality: Right;   COLONOSCOPY     LAPAROSCOPIC RIGHT HEMI COLECTOMY N/A 11/26/2020   Procedure: LAPAROSCOPIC RIGHT HEMI COLECTOMY;  Surgeon: Andria Meuse, MD;  Location: [facility];  Service: General;  Laterality: N/A;   LAPAROSCOPIC VAGINAL HYSTERECTOMY WITH SALPINGECTOMY Bilateral 06/26/2017   Procedure: LAPAROSCOPIC ASSISTED VAGINAL HYSTERECTOMY WITH SALPINGECTOMY;  Surgeon: Genia Del, MD;  Location: [facility];  Service: Gynecology;  Laterality: Bilateral;  request 7:30am OR time  requests 2 hours Rex the lighted retractor rep will be here    UPPER GI ENDOSCOPY     Social History   Social History Narrative   Not on file   Immunization History  Administered Date(s) Administered   Influenza,inj,Quad PF,6+ Mos 05/26/2017   Influenza-Unspecified 05/11/2023     Objective: Vital Signs: BP (!) 147/91 (BP Location: Right Arm, Patient Position: Sitting, Cuff Size: Large)   Pulse 66   Resp 14   Ht 5' 2.5" (1.588 m)   Wt 152 lb (68.9 kg)   LMP 03/24/2017 Comment: not sexually active  BMI 27.36 kg/m    Physical Exam Vitals and nursing note reviewed.  Constitutional:      Appearance: She is well-developed.  HENT:     Head: Normocephalic and atraumatic.  Eyes:     Conjunctiva/sclera: Conjunctivae normal.  Cardiovascular:     Rate and Rhythm: Normal rate and regular rhythm.     Heart sounds: Normal heart sounds.  Pulmonary:     Effort: Pulmonary effort is normal.     Breath sounds: Normal breath sounds.  Abdominal:     General: Bowel sounds  are normal.     Palpations: Abdomen is soft.  Musculoskeletal:     Cervical back: Normal range of motion.  Lymphadenopathy:     Cervical: No cervical adenopathy.  Skin:    General: Skin is warm and dry.     Capillary Refill: Capillary refill takes less than 2 seconds.  Neurological:     Mental Status: She is alert and oriented to person, place, and time.  Psychiatric:        Behavior: Behavior normal.      Musculoskeletal Exam: Cervical, thoracic and lumbar spine 1 good range of motion with discomfort.  Shoulders, elbows, wrists, MCPs, PIPs and DIPs Juengel range of motion.  Loss of digital tuft on the  right index finger was noted.  Mild DIP thickening was noted.  Hip joints and knee joints were in good range of motion without any warmth swelling or effusion.  There was no tenderness over ankles or MTPs.  She had generalized hyperalgesia and positive tender points.  CDAI Exam: CDAI Score: -- Patient Global: --; Provider Global: -- Swollen: --; Tender: -- Joint Exam 11/15/2023   No joint exam has been documented for this visit   There is currently no information documented on the homunculus. Go to the Rheumatology activity and complete the homunculus joint exam.  Investigation: No additional findings.  Imaging: No results found.  Recent Labs: Lab Results  Component Value Date   WBC 9.5 05/25/2023   HGB 12.5 05/25/2023   PLT 329.0 05/25/2023   NA 140 05/25/2023   K 3.5 05/25/2023   CL 99 05/25/2023   CO2 31 05/25/2023   GLUCOSE 105 (H) 05/25/2023   BUN 11 05/25/2023   CREATININE 0.83 05/25/2023   BILITOT 0.2 05/25/2023   ALKPHOS 69 05/25/2023   AST 26 05/25/2023   ALT 22 05/25/2023   PROT 7.3 05/25/2023   ALBUMIN 4.0 05/25/2023   CALCIUM 9.1 05/25/2023   GFRAA >60 08/05/2019   March 09, 2023 LDL 83, hemoglobin A1c 5.7, TSH normal  May 25, 2023 ANA 1: 40 NS, 1: 40 cytoplasmic, RF negative, anti-CCP negative, sed rate 24  Speciality Comments: No specialty  comments available.  Procedures:  No procedures performed Allergies: Patient has no known allergies.   Assessment / Plan:     Visit Diagnoses: Positive ANA (antinuclear antibody) -she has low titer positive ANA which is not significant.  She gives history of oral ulcers and arthralgias.  No synovitis was noted on the examination.  She also gives history of intermittent joint swelling.  I will obtain labs today.  There is no history of malar rash, Ro sensitivity, Raynaud's or lymphadenopathy.  Plan: ANA, Anti-scleroderma antibody, RNP Antibody, Sjogrens syndrome-A extractable nuclear antibody, Sjogrens syndrome-B extractable nuclear antibody, Anti-Smith antibody, Anti-DNA antibody, double-stranded, C3 and C4  Polyarthralgia-she complains of pain and discomfort in her cervical spine, lumbar spine, shoulders, elbows, wrist, hands, hips, knees, ankles and her feet.  She states she has intermittent swelling in her hands.  No synovitis was noted.  Pain in both hands -she complains of discomfort in the bilateral hands.  Mild DIP thickening was noted.  No synovitis was noted.  Plan: XR Hand 2 View Right, XR Hand 2 View Left.  X-rays of bilateral hands were suggestive of early osteoarthritis.  Pain in both feet -she complains of ongoing discomfort in her feet.  No synovitis was noted.  Plan: XR Foot 2 Views Right, XR Foot 2 Views Left. No MTP, PIP, DIP, intertarsal, tibiotalar or subtalar joint space narrowing was noted.  Inferior and posterior calcaneal spurs were noted.Impression: Unremarkable x-rays of the foot except calcaneal spurs were noted. No MTP, PIP, intertarsal, tibiotalar or subtalar joint narrowing was noted.  Inferior and posterior calcaneal spurs were noted.  No erosive changes were noted.  Spondylosis of lumbar region without myelopathy or radiculopathy-she had x-rays and MRI in the past which were reviewed.  Patient states she was advised surgery.  She went for physical therapy did not get  much relief.  She continues to have chronic discomfort in the lumbar region.  Myalgia -she complains of generalized muscle pain.  She had positive tender points and hyperalgesia.  Clinical findings were suggestive of fibromyalgia syndrome.  We had  brief discussion about fibromyalgia.  Need for regular exercise and stretching was discussed.  Plan: CK, Serum protein electrophoresis with reflex  Other medical problems listed as follows:  Other microscopic colitis - Followed by Dr. Myrtie Neither  Primary hypertension-blood pressure was elevated at 147/91.  Patient was advised to monitor blood pressure closely and follow-up with her PCP.  She is on losartan/HCTZ.  Other specified hypothyroidism  History of asthma-she uses albuterol and Pulmicort as needed.  Current moderate episode of major depressive disorder without prior episode (HCC)-she is on Cymbalta.  She states her depression is fairly well-controlled.  Language barrier-an interpreter was present throughout the office visit.  Orders: Orders Placed This Encounter  Procedures   XR Hand 2 View Right   XR Hand 2 View Left   XR Foot 2 Views Right   XR Foot 2 Views Left   CK   ANA   Anti-scleroderma antibody   RNP Antibody   Sjogrens syndrome-A extractable nuclear antibody   Sjogrens syndrome-B extractable nuclear antibody   Anti-Smith antibody   Anti-DNA antibody, double-stranded   C3 and C4   Serum protein electrophoresis with reflex   No orders of the defined types were placed in this encounter.    Follow-Up Instructions: Return for Polyarthralgia, myalgia.   Pollyann Savoy, MD  Note - This record has been created using Animal nutritionist.  Chart creation errors have been sought, but may not always  have been located. Such creation errors do not reflect on  the standard of medical care.

## 2023-11-15 ENCOUNTER — Ambulatory Visit: Payer: PRIVATE HEALTH INSURANCE

## 2023-11-15 ENCOUNTER — Ambulatory Visit: Payer: PRIVATE HEALTH INSURANCE | Attending: Rheumatology | Admitting: Rheumatology

## 2023-11-15 ENCOUNTER — Encounter: Payer: Self-pay | Admitting: Rheumatology

## 2023-11-15 VITALS — BP 146/93 | HR 68 | Resp 14 | Ht 62.5 in | Wt 152.0 lb

## 2023-11-15 DIAGNOSIS — M2550 Pain in unspecified joint: Secondary | ICD-10-CM

## 2023-11-15 DIAGNOSIS — M79641 Pain in right hand: Secondary | ICD-10-CM

## 2023-11-15 DIAGNOSIS — Z603 Acculturation difficulty: Secondary | ICD-10-CM

## 2023-11-15 DIAGNOSIS — K52838 Other microscopic colitis: Secondary | ICD-10-CM

## 2023-11-15 DIAGNOSIS — I10 Essential (primary) hypertension: Secondary | ICD-10-CM

## 2023-11-15 DIAGNOSIS — M47816 Spondylosis without myelopathy or radiculopathy, lumbar region: Secondary | ICD-10-CM

## 2023-11-15 DIAGNOSIS — R768 Other specified abnormal immunological findings in serum: Secondary | ICD-10-CM

## 2023-11-15 DIAGNOSIS — M79671 Pain in right foot: Secondary | ICD-10-CM

## 2023-11-15 DIAGNOSIS — M79642 Pain in left hand: Secondary | ICD-10-CM

## 2023-11-15 DIAGNOSIS — E038 Other specified hypothyroidism: Secondary | ICD-10-CM

## 2023-11-15 DIAGNOSIS — M79672 Pain in left foot: Secondary | ICD-10-CM

## 2023-11-15 DIAGNOSIS — F321 Major depressive disorder, single episode, moderate: Secondary | ICD-10-CM

## 2023-11-15 DIAGNOSIS — Z758 Other problems related to medical facilities and other health care: Secondary | ICD-10-CM

## 2023-11-15 DIAGNOSIS — M791 Myalgia, unspecified site: Secondary | ICD-10-CM

## 2023-11-15 DIAGNOSIS — Z8709 Personal history of other diseases of the respiratory system: Secondary | ICD-10-CM

## 2023-11-15 DIAGNOSIS — C182 Malignant neoplasm of ascending colon: Secondary | ICD-10-CM

## 2023-11-17 NOTE — Progress Notes (Signed)
 CK is mildly elevated, ANA is borderline positive, ENA panel negative, complements normal.  SPEP pending.  I will discuss results at the follow-up visit.

## 2023-11-21 LAB — ANTI-DNA ANTIBODY, DOUBLE-STRANDED: ds DNA Ab: <1 [IU]/mL

## 2023-11-21 LAB — CK: Total CK: 267 U/L — ABNORMAL HIGH (ref 21–240)

## 2023-11-21 LAB — C3 AND C4
C3 Complement: 180 mg/dL (ref 83–193)
C4 Complement: 29 mg/dL (ref 15–57)

## 2023-11-21 LAB — PROTEIN ELECTROPHORESIS, SERUM, WITH REFLEX
Abnormal Protein Band1: 0.4 g/dL — ABNORMAL HIGH
Abnormal Protein Band2: 0.3 g/dL — ABNORMAL HIGH
Albumin ELP: 4.1 g/dL (ref 3.8–4.8)
Alpha 1: 0.3 g/dL (ref 0.2–0.3)
Alpha 2: 0.8 g/dL (ref 0.5–0.9)
Beta 2: 0.5 g/dL (ref 0.2–0.5)
Beta Globulin: 0.5 g/dL (ref 0.4–0.6)
Gamma Globulin: 1.6 g/dL (ref 0.8–1.7)
Total Protein: 7.7 g/dL (ref 6.1–8.1)

## 2023-11-21 LAB — IFE INTERPRETATION

## 2023-11-21 LAB — RNP ANTIBODY: Ribonucleic Protein(ENA) Antibody, IgG: <1 AI

## 2023-11-21 LAB — SJOGRENS SYNDROME-B EXTRACTABLE NUCLEAR ANTIBODY: SSB (La) (ENA) Antibody, IgG: <1 AI

## 2023-11-21 LAB — ANTI-SCLERODERMA ANTIBODY: Scleroderma (Scl-70) (ENA) Antibody, IgG: <1 AI

## 2023-11-21 LAB — ANTI-NUCLEAR AB-TITER (ANA TITER): ANA Titer 1: 1:40 {titer} — ABNORMAL HIGH

## 2023-11-21 LAB — SJOGRENS SYNDROME-A EXTRACTABLE NUCLEAR ANTIBODY: SSA (Ro) (ENA) Antibody, IgG: <1 AI

## 2023-11-21 LAB — ANA: Anti Nuclear Antibody (ANA): POSITIVE — AB

## 2023-11-21 LAB — ANTI-SMITH ANTIBODY: ENA SM Ab Ser-aCnc: <1 AI

## 2023-11-30 ENCOUNTER — Other Ambulatory Visit: Payer: Self-pay | Admitting: *Deleted

## 2023-11-30 DIAGNOSIS — R899 Unspecified abnormal finding in specimens from other organs, systems and tissues: Secondary | ICD-10-CM

## 2023-11-30 NOTE — Progress Notes (Signed)
 Referral for hematology placed per Dr. Corliss Skains due to abnormal IFE result.

## 2023-11-30 NOTE — Progress Notes (Signed)
 Office Visit Note  Patient: Julia Green             Date of Birth: 1971-04-07           MRN: [id number]             PCP: Elvira Hammersmith, MD Referring: Elvira Hammersmith, * Visit Date: 12/14/2023 Occupation: @GUAROCC @ Interpretor: Ana  Subjective:  Pain in joints and muscles  History of Present Illness: Julia Green le ferraz is a 53 y.o. female returns today after her initial evaluation on November 15, 2023 for pain in joints and positive ANA.  She states she continues to have pain in all of her joints and muscles.  She complains of discomfort in her neck, upper back, lower back, shoulders, elbows, wrist, hands, hips, knees, ankles and her feet.  She reports discomfort in all of her muscles.  She notices intermittent swelling.  She gives history of dry mouth, intermittent rashes.  There is no history of oral ulcers, malar rash, photosensitivity, Raynaud's or lymphadenopathy.    Activities of Daily Living:  Patient reports morning stiffness for   none .   Patient Reports nocturnal pain.  Difficulty dressing/grooming: Denies Difficulty climbing stairs: Reports Difficulty getting out of chair: Denies Difficulty using hands for taps, buttons, cutlery, and/or writing: Reports  Review of Systems  Constitutional:  Negative for fatigue.  HENT:  Positive for mouth dryness. Negative for mouth sores.   Eyes:  Negative for dryness.  Respiratory:  Negative for shortness of breath.   Cardiovascular:  Positive for palpitations. Negative for chest pain.  Gastrointestinal:  Positive for constipation. Negative for blood in stool and diarrhea.  Endocrine: Negative for increased urination.  Genitourinary:  Negative for involuntary urination.  Musculoskeletal:  Positive for joint pain, joint pain, joint swelling, myalgias, muscle tenderness and myalgias. Negative for gait problem, muscle weakness and morning stiffness.  Skin:  Positive for hair loss. Negative for color change,  rash and sensitivity to sunlight.  Allergic/Immunologic: Positive for susceptible to infections.  Neurological:  Positive for dizziness and headaches.  Hematological:  Negative for swollen glands.  Psychiatric/Behavioral:  Positive for sleep disturbance. Negative for depressed mood. The patient is not nervous/anxious.     PMFS History:  Patient Active Problem List   Diagnosis Date Noted   Acute cough 12/05/2023   Viral upper respiratory infection 12/05/2023   Rhinorrhea 12/05/2023   Palpitations 12/05/2023   Chronic sinusitis 06/15/2023   Arthralgia of multiple sites 05/25/2023   Hypokalemia 05/25/2023   Rheumatism 05/25/2023   History of asthma 03/09/2023   Current moderate episode of major depressive disorder without prior episode (HCC) 03/09/2023   Hypertension    Colon cancer (HCC) 11/26/2020   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   Hypothyroidism 11/16/2016    Past Medical History:  Diagnosis Date   Arthritis    Asthma    Depression    Fatty liver    Gallstones    GERD (gastroesophageal reflux disease)    Headache    History of low potassium    Hypertension    Hypothyroidism    Right arm numbness    Right leg numbness    Sciatic nerve pain    Thyroid disease    Urticaria     Family History  Problem Relation Age of Onset   Asthma Mother    Hypertension Mother    Uterine cancer Mother    Kidney failure Father  Lung disease Father    High blood pressure Father    Cancer Sister        unknown   Kidney failure Sister    Stomach cancer Maternal Aunt    Eczema Niece    Colon cancer Neg Hx    Esophageal cancer Neg Hx    Rectal cancer Neg Hx    Past Surgical History:  Procedure Laterality Date   APPENDECTOMY     CARPAL TUNNEL RELEASE Right 08/07/2019   Procedure: RIGHT CARPAL TUNNEL RELEASE;  Surgeon: Wes Hamman, MD;  Location: [facility];  Service: Orthopedics;  Laterality: Right;   COLONOSCOPY      LAPAROSCOPIC RIGHT HEMI COLECTOMY N/A 11/26/2020   Procedure: LAPAROSCOPIC RIGHT HEMI COLECTOMY;  Surgeon: Melvenia Stabs, MD;  Location: [facility];  Service: General;  Laterality: N/A;   LAPAROSCOPIC VAGINAL HYSTERECTOMY WITH SALPINGECTOMY Bilateral 06/26/2017   Procedure: LAPAROSCOPIC ASSISTED VAGINAL HYSTERECTOMY WITH SALPINGECTOMY;  Surgeon: Lavoie, Marie-Lyne, MD;  Location: [facility];  Service: Gynecology;  Laterality: Bilateral;  request 7:30am OR time  requests 2 hours Rex the lighted retractor rep will be here    UPPER GI ENDOSCOPY     Social History   Social History Narrative   Not on file   Immunization History  Administered Date(s) Administered   Influenza,inj,Quad PF,6+ Mos 05/26/2017   Influenza-Unspecified 05/11/2023     Objective: Vital Signs: BP (!) 152/101 (BP Location: Left Arm, Patient Position: Sitting, Cuff Size: Large)   Pulse 68   Resp 16   Ht 5' 2.5" (1.588 m)   Wt 152 lb (68.9 kg)   LMP 03/24/2017 Comment: not sexually active  BMI 27.36 kg/m    Physical Exam Vitals and nursing note reviewed.  Constitutional:      Appearance: She is well-developed.  HENT:     Head: Normocephalic and atraumatic.  Eyes:     Conjunctiva/sclera: Conjunctivae normal.  Cardiovascular:     Rate and Rhythm: Normal rate and regular rhythm.     Heart sounds: Normal heart sounds.  Pulmonary:     Effort: Pulmonary effort is normal.     Breath sounds: Normal breath sounds.  Abdominal:     General: Bowel sounds are normal.     Palpations: Abdomen is soft.  Musculoskeletal:     Cervical back: Normal range of motion.  Lymphadenopathy:     Cervical: No cervical adenopathy.  Skin:    General: Skin is warm and dry.     Capillary Refill: Capillary refill takes less than 2 seconds.  Neurological:     Mental Status: She is alert and oriented to person, place, and time.  Psychiatric:        Behavior: Behavior normal.      Musculoskeletal Exam: Cervical, thoracic and  lumbar spine with good range of motion.  Shoulder joints, elbow joints, wrist joints, MCPs PIPs and DIPs with good range of motion.  She had bilateral  DIP and PIP thickening with no synovitis.  Hip joints and knee joints were in good range of motion without any warmth swelling or effusion.  There was no tenderness over ankles or MTPs.  She had generalized hyperalgesia and positive tender points.  CDAI Exam: CDAI Score: -- Patient Global: --; Provider Global: -- Swollen: --; Tender: -- Joint Exam 12/14/2023   No joint exam has been documented for this visit   There is currently no information documented on the homunculus. Go to the Rheumatology activity and complete the homunculus joint  exam.  Investigation: No additional findings.  Imaging: XR Foot 2 Views Left Result Date: 11/15/2023 No MTP, PIP, DIP, intertarsal, tibiotalar or subtalar joint space narrowing was noted.  Inferior and posterior calcaneal spurs were noted. Impression: Unremarkable x-rays of the foot except calcaneal spurs were noted.  XR Foot 2 Views Right Result Date: 11/15/2023 No MTP, PIP, DIP, intertarsal, tibiotalar or subtalar joint space narrowing was noted.  Inferior and posterior calcaneal spurs were noted. Impression: Unremarkable x-rays of the foot except calcaneal spurs were noted.  XR Hand 2 View Left Result Date: 11/15/2023 Mild CMC and PIP narrowing was noted.  No MCP, intercarpal or radiocarpal joint space narrowing was noted.  No erosive changes were noted. 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  XR Hand 2 View Right Result Date: 11/15/2023 Mild CMC and PIP narrowing was noted.  No MCP, intercarpal or radiocarpal joint space narrowing was noted.  No erosive changes were noted. 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   Recent Labs: Lab Results  Component Value Date   WBC 9.5 05/25/2023   HGB 12.5 05/25/2023   PLT 329.0  05/25/2023   NA 140 05/25/2023   K 3.5 05/25/2023   CL 99 05/25/2023   CO2 31 05/25/2023   GLUCOSE 105 (H) 05/25/2023   BUN 11 05/25/2023   CREATININE 0.83 05/25/2023   BILITOT 0.2 05/25/2023   ALKPHOS 69 05/25/2023   AST 26 05/25/2023   ALT 22 05/25/2023   PROT 7.7 11/15/2023   ALBUMIN 4.0 05/25/2023   CALCIUM 9.1 05/25/2023   GFRAA >60 08/05/2019   November 15, 2023 IFE IgG kappa monoclonal band present, ANA 1: 40 NS, ENA (SCL 70, RNP, Smith, dsDNA, SSA, SSB) negative, C3-C4 normal, [id number]  Speciality Comments: No specialty comments available.  Procedures:  No procedures performed Allergies: Patient has no known allergies.   Assessment / Plan:     Visit Diagnoses: Positive ANA (antinuclear antibody) - ANA low titer positive, ENA panel negative, complements normal.  Lab results were discussed with the patient at length.  She has no clinical features of lupus or related disorder.  She gives history of intermittent joint swelling but no synovitis was noted on the examination.  I advised her to contact me if she develops any increased joint swelling or any new symptoms.  Polyarthralgia -she gives history of pain in cervical spine, lumbar spine, shoulders, elbows, wrist, hands, hips, knees, ankles and feet.  History of intermittent swelling.  No synovitis was noted.  Pain in both hands -she complains of discomfort in the bilateral hands.  Bilateral PIP and DIP thickening was noted.  X-rays and clinical findings were suggestive of early osteoarthritic changes.  X-ray findings were reviewed with the patient.  Joint protection muscle strengthening was discussed.  A handout on hand exercises was given.  Pain in both feet - History of discomfort in the feet.  No synovitis noted.  X-rays obtained at the last visit were unremarkable.  X-ray findings were reviewed with the patient.  Proper fitting shoes were advised.  Spondylosis of lumbar region without myelopathy or radiculopathy - She was  evaluated by spine specialist in the past.  Patient reports that surgery was advised.  She has chronic discomfort.  A handout on back exercises was given.  I offered physical therapy but she declined.  Fibromyalgia -detail counseled regarding fibromyalgia syndrome was provided.  A handout was placed in the AVS.  Patient has generalized pain,  hyperalgesia and positive tender points.  CK is mildly elevated most likely related to her workload.  She had no muscular weakness.  SPEP abnormal.  Abnormal SPEP - Abnormal IFE.  Patient was referred to hematology.  Other microscopic colitis - Followed by Dr. Dominic Friendly.  Primary hypertension-blood pressure was weighted at 142/89.  Repeat blood pressure was 152/101.  She was advised to monitor blood pressure closely and follow-up with her PCP.  Other specified hypothyroidism  History of asthma  Current moderate episode of major depressive disorder without prior episode (HCC) -she is on Cymbalta .  Language barrier  Orders: No orders of the defined types were placed in this encounter.  No orders of the defined types were placed in this encounter.    Follow-Up Instructions: Return if symptoms worsen or fail to improve, for Osteoarthritis.   Nicholas Bari, MD  Note - This record has been created using Animal nutritionist.  Chart creation errors have been sought, but may not always  have been located. Such creation errors do not reflect on  the standard of medical care.

## 2023-12-05 ENCOUNTER — Ambulatory Visit (INDEPENDENT_AMBULATORY_CARE_PROVIDER_SITE_OTHER): Payer: PRIVATE HEALTH INSURANCE | Admitting: Emergency Medicine

## 2023-12-05 ENCOUNTER — Encounter: Payer: Self-pay | Admitting: Emergency Medicine

## 2023-12-05 ENCOUNTER — Ambulatory Visit: Payer: PRIVATE HEALTH INSURANCE | Attending: Emergency Medicine

## 2023-12-05 VITALS — BP 138/90 | HR 100 | Temp 98.6°F | Ht 62.0 in | Wt 154.2 lb

## 2023-12-05 DIAGNOSIS — R002 Palpitations: Secondary | ICD-10-CM

## 2023-12-05 DIAGNOSIS — R051 Acute cough: Secondary | ICD-10-CM

## 2023-12-05 DIAGNOSIS — J3489 Other specified disorders of nose and nasal sinuses: Secondary | ICD-10-CM | POA: Insufficient documentation

## 2023-12-05 DIAGNOSIS — I10 Essential (primary) hypertension: Secondary | ICD-10-CM | POA: Diagnosis not present

## 2023-12-05 DIAGNOSIS — J069 Acute upper respiratory infection, unspecified: Secondary | ICD-10-CM | POA: Diagnosis not present

## 2023-12-05 DIAGNOSIS — E038 Other specified hypothyroidism: Secondary | ICD-10-CM

## 2023-12-05 LAB — POC INFLUENZA A&B (BINAX/QUICKVUE)
Influenza A, POC: NEGATIVE
Influenza B, POC: NEGATIVE

## 2023-12-05 LAB — POC COVID19 BINAXNOW: SARS Coronavirus 2 Ag: NEGATIVE

## 2023-12-05 MED ORDER — LEVOCETIRIZINE DIHYDROCHLORIDE 5 MG PO TABS: 5.0000 mg | ORAL_TABLET | Freq: Every evening | ORAL | 1 refills | Status: DC

## 2023-12-05 NOTE — Assessment & Plan Note (Signed)
 Clinically stable.  Running its course without complications. No red flag signs or symptoms. Symptom management discussed. Advised to rest and stay well-hydrated Advised to contact the office if no better or worse during the next several days.

## 2023-12-05 NOTE — Patient Instructions (Signed)
Infeccin respiratoria viral Viral Respiratory Infection Una infeccin respiratoria viral es una enfermedad que afecta las partes del cuerpo que utilizamos para Industrial/product designer. Estas incluyen los pulmones, la nariz y Administrator. Es causada por un germen llamado virus. Algunos ejemplos de este tipo de infeccin son los siguientes: Un resfro. La gripe (influenza). Una infeccin por el virus respiratorio sincicial (VRS). Cules son las causas? La causa de esta afeccin es un virus. Se transmite de [place] persona a otra. Puede contraer el virus si: Inhala gotitas de una persona que est enferma. Tiene contacto con personas que estn enfermas. Toca mucosidad u otro lquido de una persona que est enferma. Cules son los signos o sntomas? Los sntomas de esta afeccin incluyen: Secrecin o congestin nasal. Dolor de garganta. Tos. Falta de aire. Dificultad para respirar. Lquido verde o [employer]. Otros sntomas pueden incluir: Fiebre. Sudoracin o escalofros. Cansancio (fatiga). Dolores musculares. Dolor de [place]. Cmo se trata? El tratamiento de esta afeccin puede incluir: Medicamentos para tratar los virus. Medicamentos que facilitan la respiracin. Medicamentos que se pulverizan dentro de la Clinical cytogeneticist. Paracetamol o antiinflamatorios no esteroideos (AINE), como ibuprofeno, para tratar [employer]. Siga estas instrucciones en su casa: Control del dolor y la congestin Use los medicamentos de venta libre y los recetados solamente como se lo haya indicado el mdico. Si le duele la garganta, haga grgaras de agua con sal. Haga esto entre 3 o 4 veces por da, segn sea necesario. Para preparar agua con sal, disuelva de  a 1 cucharadita (de 3 a 6 g) de sal en 1 taza (237 ml) de agua tibia. Asegrese de que se disuelva toda la sal. Use gotas para la nariz hechas con agua salada. Estas ayudan con la secrecin (congestin). Tambin ayudan a Chartered loss adjuster piel alrededor de Chief Financial Officer. Tome 2 cucharaditas (10 ml) de miel a la hora de acostarse para disminuir la tos por la noche. No d miel a nios menores de 1 ao. Beba suficiente lquido para Radio producer pis (la orina) de color amarillo plido. Instrucciones generales  Descanse todo lo que pueda. No beba alcohol. No fume ni consuma ningn producto que contenga nicotina o tabaco. Si necesita ayuda para dejar de fumar, consulte al mdico. Concurra a todas las visitas de seguimiento. Cmo se evita?     Colquese la vacuna antigripal todos los [place]. Pregntele al mdico cundo debe aplicarse la vacuna contra la gripe. No permita que otras personas contraigan sus grmenes. Si est enfermo: Lvese las manos frecuentemente con agua y [place]. Lvese las manos despus de toser o Engineering geologist. Lvese las manos durante al menos 20 segundos. Use un desinfectante para manos si no dispone de [place] y [place]. Cbrase la boca al toser. Cbrase la nariz y la boca cuando estornude. No comparta vasos ni utensilios para comer. Limpie los objetos usados comnmente con frecuencia. Limpie las superficies que se tocan comnmente. Lanny Hurst en su casa y no concurra al Aleen Campi ni a la escuela. Evite el contacto con personas que estn enfermas durante la temporada de resfro y gripe. Esta es en otoo e invierno. Solicite ayuda si: Los sntomas duran [address] o ms. Los sntomas empeoran con [employer]. Repentinamente, siente un dolor muy intenso en el rostro o la frente. Se hinchan mucho algunas partes de la mandbula o del cuello. Le falta el aire. Solicite ayuda de inmediato si: Electronics engineer u opresin en el pecho. Tiene dificultad para respirar. Se siente mareado o como si fuera a desmayarse.  Sigue vomitando y [place]. Se siente confundido. Estos sntomas pueden Customer service manager. Solicite ayuda de inmediato. Comunquese con el servicio de emergencias de su localidad (911 en los [place]). No espere a ver si los sntomas  desaparecen. No conduzca por sus propios medios [facility]. Resumen Una infeccin respiratoria viral es una enfermedad que afecta las partes del cuerpo que utilizamos para Industrial/product designer. Entre los ejemplos de esta enfermedad, se incluyen el resfro, la gripe y la infeccin por el virus respiratorio sincicial (VRS). La infeccin puede causar secrecin nasal, tos, dolor de garganta y [place]. Siga las indicaciones del mdico acerca de tomar medicamentos, beber gran cantidad de lquido, lavarse las manos, Lawyer en casa y Automotive engineer el contacto con personas enfermas. Esta informacin no tiene Theme park manager el consejo del mdico. Asegrese de hacerle al mdico cualquier pregunta que tenga. Document Revised: 12/11/2020 Document Reviewed: 12/11/2020 Elsevier Patient Education  2024 [employer].

## 2023-12-05 NOTE — Assessment & Plan Note (Signed)
Clinically euthyroid. Continue levothyroxine 112 mcg daily.

## 2023-12-05 NOTE — Progress Notes (Signed)
 Julia Green 53 y.o.   Chief Complaint  Patient presents with   Hypertension    Pt state she has a cold onset 12/01/2023 started with a bad headache  Cough, nose bleed and congestion  HBP onset the beginning of march     HISTORY OF PRESENT ILLNESS: This is a 53 y.o. female complaining of flulike symptoms that started about 4 to 5 days ago Mostly complaining of runny nose which in watery itchy eyes and itchy throat Has been taking over-the-counter Claritin-D. Has history of hypertension Also complaining of intermittent palpitation for the past several weeks.  No chest pain or trouble breathing. No other complaints or medical concerns today.  HPI   Prior to Admission medications   Medication Sig Start Date End Date Taking? Authorizing Provider  albuterol (ACCUNEB) 1.25 MG/3ML nebulizer solution Take by nebulization every 6 (six) hours as needed.    [provider]  aspirin EC 81 MG tablet Take 81 mg by mouth daily.    [provider]  azelastine (ASTELIN) 0.1 % nasal spray Place 2 sprays into both nostrils 2 (two) times daily. Use in each nostril as directed 07/05/22   Kandice Orleans, MD  budesonide (PULMICORT) 0.25 MG/2ML nebulizer solution Take 2 mLs (0.25 mg total) by nebulization in the morning and at bedtime. 06/30/22   Kandice Orleans, MD  calcium carbonate (OSCAL) 1500 (600 Ca) MG TABS tablet Take 1,500 mg by mouth daily.    [provider]  cetirizine-pseudoephedrine (ZYRTEC-D) 5-120 MG tablet Take 1 tablet by mouth every 12 (twelve) hours. 05/30/22   [provider]  DULoxetine (CYMBALTA) 30 MG capsule Take 1 capsule (30 mg total) by mouth daily. 06/15/23   Izaha Shughart Jose, MD  estradiol (ESTRACE) 1 MG tablet Take 1 tablet (1 mg total) by mouth daily. 02/22/23   Lavoie, Marie-Lyne, MD  fluticasone (FLONASE) 50 MCG/ACT nasal spray Place 2 sprays into both nostrils daily. 06/30/22   Kandice Orleans, MD  levothyroxine (SYNTHROID)  112 MCG tablet TAKE 1 TABLET BY MOUTH EVERY MORNING*RETURN TO CLINIC IN 6 WEEKS FOR TSH RECHECK 04/21/23   Elvira Hammersmith, MD  losartan-hydrochlorothiazide (HYZAAR) 100-12.5 MG tablet Take 1 tablet by mouth daily. 03/11/23   Elvira Hammersmith, MD  montelukast (SINGULAIR) 10 MG tablet Take 1 tablet (10 mg total) by mouth at bedtime. 06/15/23   Luccia Reinheimer Jose, MD  Omega-3 Fatty Acids (FISH OIL PO) Take by mouth.    [provider]  omeprazole (PRILOSEC) 40 MG capsule Take 1 capsule (40 mg total) by mouth daily. 06/15/23   Elvira Hammersmith, MD  vitamin C (ASCORBIC ACID) 500 MG tablet Take 500 mg by mouth daily.    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   Hypothyroidism 11/16/2016    Past Medical History:  Diagnosis Date   Arthritis    Asthma    Depression    Fatty liver    Gallstones    GERD (gastroesophageal reflux disease)    Headache    History of low potassium  Hypertension    Hypothyroidism    Right arm numbness    Right leg numbness    Sciatic nerve pain    Thyroid disease    Urticaria     Past Surgical History:  Procedure Laterality Date   APPENDECTOMY     CARPAL TUNNEL RELEASE Right 08/07/2019   Procedure: RIGHT CARPAL TUNNEL RELEASE;  Surgeon: Tarry Kos, MD;  Location: [facility];  Service: Orthopedics;  Laterality: Right;   COLONOSCOPY     LAPAROSCOPIC RIGHT HEMI COLECTOMY N/A 11/26/2020   Procedure: LAPAROSCOPIC RIGHT HEMI COLECTOMY;  Surgeon: Andria Meuse, MD;  Location: [facility]  OR;  Service: General;  Laterality: N/A;   LAPAROSCOPIC VAGINAL HYSTERECTOMY WITH SALPINGECTOMY Bilateral 06/26/2017   Procedure: LAPAROSCOPIC ASSISTED VAGINAL HYSTERECTOMY WITH SALPINGECTOMY;  Surgeon: Genia Del, MD;  Location: [facility];  Service: Gynecology;  Laterality: Bilateral;  request 7:30am OR time  requests 2 hours Rex the lighted retractor rep will be here    UPPER GI ENDOSCOPY      Social History   Socioeconomic History   Marital status: Divorced    Spouse name: Not on file   Number of children: 4   Years of education: Not on file   Highest education level: Not on file  Occupational History   Not on file  Tobacco Use   Smoking status: Never    Passive exposure: Never   Smokeless tobacco: Never  Vaping Use   Vaping status: Never Used  Substance and Sexual Activity   Alcohol use: No   Drug use: No   Sexual activity: Not Currently    Partners: Male    Birth control/protection: Surgical    Comment: first time sexual intercourse 53 years old, less than 5   Other Topics Concern   Not on file  Social History Narrative   Not on file   Social Drivers of Health   Financial Resource Strain: Not on file  Food Insecurity: Not on file  Transportation Needs: Not on file  Physical Activity: Not on file  Stress: Not on file  Social Connections: Unknown (01/03/2022)   [facility], [facility]   Social Network    Social Network: Not on file  Intimate Partner Violence: Unknown (11/25/2021)   Received from [facility], [facility]   HITS    Physically Hurt: Not on file    Insult or Talk Down To: Not on file    Threaten Physical Harm: Not on file    Scream or Curse: Not on file    Family History  Problem Relation Age of Onset   Asthma Mother    Hypertension Mother    Uterine cancer Mother    Kidney failure Father    Lung disease Father    High blood pressure Father    Cancer Sister        unknown   Kidney failure Sister    Stomach  cancer Maternal Aunt    Eczema Niece    Colon cancer Neg Hx    Esophageal cancer Neg Hx    Rectal cancer Neg Hx      Review of Systems  Constitutional: Negative.  Negative for chills and fever.  HENT:  Positive for congestion and sore throat.   Respiratory:  Positive for cough. Negative for sputum production and shortness of breath.   Cardiovascular:  Positive for palpitations. Negative for chest pain.  Gastrointestinal:  Negative for abdominal pain, diarrhea, nausea and vomiting.  Genitourinary: Negative.  Negative for dysuria and hematuria.  Neurological: Negative.  Negative for dizziness and headaches.  All other systems reviewed and are negative.   Vitals:   12/05/23 1508  BP: (!) 138/90  Pulse: 100  Temp: 98.6 F (37 C)  SpO2: 99%    Physical Exam Vitals reviewed.  Constitutional:      Appearance: Normal appearance.  HENT:     Head: Normocephalic.     Right Ear: Tympanic membrane, ear canal and external ear normal.     Left Ear: Tympanic membrane, ear canal and external ear normal.     Mouth/Throat:     Mouth: Mucous membranes are moist.     Pharynx: Oropharynx is clear.  Eyes:     Extraocular Movements: Extraocular movements intact.     Pupils: Pupils are equal, round, and reactive to light.  Cardiovascular:     Rate and Rhythm: Normal rate and regular rhythm.     Pulses: Normal pulses.     Heart sounds: Normal heart sounds.  Pulmonary:     Effort: Pulmonary effort is normal.     Breath sounds: Normal breath sounds.  Musculoskeletal:     Cervical back: No tenderness.  Lymphadenopathy:     Cervical: No cervical adenopathy.  Skin:    General: Skin is warm and dry.     Capillary Refill: Capillary refill takes less than 2 seconds.  Neurological:     General: No focal deficit present.     Mental Status: She is alert and oriented to person, place, and time.  Psychiatric:        Mood and Affect: Mood normal.        Behavior: Behavior normal.    Results  for orders placed or performed in visit on 12/05/23 (from the past 24 hours)  POC COVID-19     Status: Normal   Collection Time: 12/05/23  4:30 PM  Result Value Ref Range   SARS Coronavirus 2 Ag Negative Negative  POC Influenza A&B (Binax test)     Status: Normal   Collection Time: 12/05/23  4:30 PM  Result Value Ref Range   Influenza A, POC Negative Negative   Influenza B, POC Negative Negative    EKG: Normal sinus rhythm with ventricular rate of 85.  No acute ischemic changes.  ASSESSMENT & PLAN: A total of 46 minutes was spent with the patient and counseling/coordination of care regarding preparing for this visit, review of most recent office visit notes, review of multiple chronic medical conditions and their management, review of all medications, review of most recent bloodwork results, review of health maintenance items, education on nutrition, prognosis, documentation, and need for follow up.   Problem List Items Addressed This Visit       Cardiovascular and Mediastinum   Hypertension   BP Readings from Last 3 Encounters:  12/05/23 (!) 138/90  11/15/23 (!) 146/93  06/15/23 132/86  Elevated blood pressure reading in the office most likely secondary to over-the-counter decongestants. Advised to stop it. Continue Hyzaar 100-12.5 mg daily         Respiratory   Viral upper respiratory infection - Primary   Clinically stable.  Running its course without complications. No red flag signs or symptoms. Symptom management discussed. Advised to rest and stay well-hydrated Advised to contact the office if no better or worse during the next several days.        Endocrine   Hypothyroidism   Clinically euthyroid Continue levothyroxine 112 mcg daily  Other   Acute cough   Cough management discussed. Recommend over-the-counter Mucinex DM and cough drops Advised to rest and stay well-hydrated      Relevant Orders   POC COVID-19   POC Influenza A&B (Binax test)    EKG 12-Lead   Rhinorrhea   Recommend to start Xyzal 5 mg daily Stop any other over-the-counter antihistamines      Relevant Medications   levocetirizine (XYZAL) 5 MG tablet   Palpitations   History of hypertension.  Non-smoker. Unremarkable EKG today. Recommend Zio patch monitoring for 2 weeks      Relevant Orders   LONG TERM MONITOR (3-14 DAYS)   Patient Instructions  Infeccin respiratoria viral Viral Respiratory Infection Una infeccin respiratoria viral es una enfermedad que afecta las partes del cuerpo que utilizamos para Industrial/product designer. Estas incluyen los pulmones, la nariz y Administrator. Es causada por un germen llamado virus. Algunos ejemplos de este tipo de infeccin son los siguientes: Un resfro. La gripe (influenza). Una infeccin por el virus respiratorio sincicial (VRS). Cules son las causas? La causa de esta afeccin es un virus. Se transmite de [place] persona a otra. Puede contraer el virus si: Inhala gotitas de una persona que est enferma. Tiene contacto con personas que estn enfermas. Toca mucosidad u otro lquido de una persona que est enferma. Cules son los signos o sntomas? Los sntomas de esta afeccin incluyen: Secrecin o congestin nasal. Dolor de garganta. Tos. Falta de aire. Dificultad para respirar. Lquido verde o [place] en la nariz. Otros sntomas pueden incluir: Fiebre. Sudoracin o escalofros. Cansancio (fatiga). Dolores musculares. Dolor de cabeza. Cmo se trata? El tratamiento de esta afeccin puede incluir: Medicamentos para tratar los virus. Medicamentos que facilitan la respiracin. Medicamentos que se pulverizan dentro de la Clinical cytogeneticist. Paracetamol o antiinflamatorios no esteroideos (AINE), como ibuprofeno, para tratar [employer]. Siga estas instrucciones en su casa: Control del dolor y la congestin Use los medicamentos de venta libre y los recetados solamente como se lo haya indicado el mdico. Si le duele la garganta, haga  grgaras de agua con sal. Haga esto entre 3 o 4 veces por da, segn sea necesario. Para preparar agua con sal, disuelva de  a 1 cucharadita (de 3 a 6 g) de sal en 1 taza (237 ml) de agua tibia. Asegrese de que se disuelva toda la sal. Use gotas para la nariz hechas con agua salada. Estas ayudan con la secrecin (congestin). Tambin ayudan a suavizar la piel alrededor de la nariz. Tome 2 cucharaditas (10 ml) de miel a la hora de acostarse para disminuir la tos por la noche. No d miel a nios menores de 1 ao. Beba suficiente lquido para Radio producer pis (la orina) de color amarillo plido. Instrucciones generales  Descanse todo lo que pueda. No beba alcohol. No fume ni consuma ningn producto que contenga nicotina o tabaco. Si necesita ayuda para dejar de fumar, consulte al mdico. Concurra a todas las visitas de seguimiento. Cmo se evita?     Colquese la vacuna antigripal todos los [place]. Pregntele al mdico cundo debe aplicarse la vacuna contra la gripe. No permita que otras personas contraigan sus grmenes. Si est enfermo: Lvese las manos frecuentemente con agua y [place]. Lvese las manos despus de toser o Engineering geologist. Lvese las manos durante al menos 20 segundos. Use un desinfectante para manos si no dispone de agua y jabn. Cbrase la boca al toser. Cbrase la nariz y la boca cuando estornude. No comparta vasos ni utensilios para comer.  Limpie los objetos usados comnmente con frecuencia. Limpie las superficies que se tocan comnmente. Levern Reader en su casa y no concurra al Danne Dustman ni a la escuela. Evite el contacto con personas que estn enfermas durante la temporada de resfro y gripe. Esta es en otoo e invierno. Solicite ayuda si: Los sntomas duran [address] o ms. Los sntomas empeoran con [employer]. Repentinamente, siente un dolor muy intenso en el rostro o la frente. Se hinchan mucho algunas partes de la mandbula o del cuello. Le falta el aire. Solicite ayuda de  inmediato si: Electronics engineer u opresin en el pecho. Tiene dificultad para respirar. Se siente mareado o como si fuera a desmayarse. Sigue vomitando y [place]. Se siente confundido. Estos sntomas pueden Customer service manager. Solicite ayuda de inmediato. Comunquese con el servicio de emergencias de su localidad (911 en los [place]). No espere a ver si los sntomas desaparecen. No conduzca por sus propios medios [facility]. Resumen Una infeccin respiratoria viral es una enfermedad que afecta las partes del cuerpo que utilizamos para Industrial/product designer. Entre los ejemplos de esta enfermedad, se incluyen el resfro, la gripe y la infeccin por el virus respiratorio sincicial (VRS). La infeccin puede causar secrecin nasal, tos, dolor de garganta y [place]. Siga las indicaciones del mdico acerca de tomar medicamentos, beber gran cantidad de lquido, lavarse las manos, Lawyer en casa y Automotive engineer el contacto con personas enfermas. Esta informacin no tiene Theme park manager el consejo del mdico. Asegrese de hacerle al mdico cualquier pregunta que tenga. Document Revised: 12/11/2020 Document Reviewed: 12/11/2020 Elsevier Patient Education  2024 [employer] Inc.     Maryagnes Small, MD [facility] at [facility]

## 2023-12-05 NOTE — Assessment & Plan Note (Signed)
 BP Readings from Last 3 Encounters:  12/05/23 (!) 138/90  11/15/23 (!) 146/93  06/15/23 132/86  Elevated blood pressure reading in the office most likely secondary to over-the-counter decongestants. Advised to stop it. Continue Hyzaar 100-12.5 mg daily

## 2023-12-05 NOTE — Assessment & Plan Note (Signed)
 History of hypertension.  Non-smoker. Unremarkable EKG today. Recommend Zio patch monitoring for 2 weeks

## 2023-12-05 NOTE — Assessment & Plan Note (Signed)
 Cough management discussed Recommend over-the-counter Mucinex DM and cough drops Advised to rest and stay well-hydrated

## 2023-12-05 NOTE — Progress Notes (Unsigned)
 EP to read.

## 2023-12-05 NOTE — Assessment & Plan Note (Signed)
 Recommend to start Xyzal 5 mg daily Stop any other over-the-counter antihistamines

## 2023-12-07 NOTE — Telephone Encounter (Signed)
 This message is from 2 days ago and I saw her in the office on 12/05/2023.  Is she getting better or worse?  Please call patient.

## 2023-12-14 ENCOUNTER — Encounter: Payer: Self-pay | Admitting: Rheumatology

## 2023-12-14 ENCOUNTER — Ambulatory Visit: Payer: PRIVATE HEALTH INSURANCE | Attending: Rheumatology | Admitting: Rheumatology

## 2023-12-14 VITALS — BP 152/101 | HR 68 | Resp 16 | Ht 62.5 in | Wt 152.0 lb

## 2023-12-14 DIAGNOSIS — F321 Major depressive disorder, single episode, moderate: Secondary | ICD-10-CM

## 2023-12-14 DIAGNOSIS — M2550 Pain in unspecified joint: Secondary | ICD-10-CM | POA: Diagnosis not present

## 2023-12-14 DIAGNOSIS — M79641 Pain in right hand: Secondary | ICD-10-CM | POA: Diagnosis not present

## 2023-12-14 DIAGNOSIS — M79671 Pain in right foot: Secondary | ICD-10-CM | POA: Diagnosis not present

## 2023-12-14 DIAGNOSIS — R768 Other specified abnormal immunological findings in serum: Secondary | ICD-10-CM

## 2023-12-14 DIAGNOSIS — Z758 Other problems related to medical facilities and other health care: Secondary | ICD-10-CM

## 2023-12-14 DIAGNOSIS — M47816 Spondylosis without myelopathy or radiculopathy, lumbar region: Secondary | ICD-10-CM

## 2023-12-14 DIAGNOSIS — Z603 Acculturation difficulty: Secondary | ICD-10-CM

## 2023-12-14 DIAGNOSIS — I10 Essential (primary) hypertension: Secondary | ICD-10-CM

## 2023-12-14 DIAGNOSIS — K52838 Other microscopic colitis: Secondary | ICD-10-CM

## 2023-12-14 DIAGNOSIS — E038 Other specified hypothyroidism: Secondary | ICD-10-CM

## 2023-12-14 DIAGNOSIS — M79672 Pain in left foot: Secondary | ICD-10-CM

## 2023-12-14 DIAGNOSIS — M79642 Pain in left hand: Secondary | ICD-10-CM

## 2023-12-14 DIAGNOSIS — Z8709 Personal history of other diseases of the respiratory system: Secondary | ICD-10-CM

## 2023-12-14 DIAGNOSIS — R778 Other specified abnormalities of plasma proteins: Secondary | ICD-10-CM

## 2023-12-14 DIAGNOSIS — M797 Fibromyalgia: Secondary | ICD-10-CM

## 2023-12-14 NOTE — Patient Instructions (Addendum)
 Rehabilitacin de esguince o distensin de la parte inferior de la espalda Low Back Sprain or Strain Rehab Pregunte al mdico qu ejercicios son seguros para usted. Haga los ejercicios exactamente como se lo haya indicado el mdico y gradelos como se lo hayan indicado. Es normal sentir un leve estiramiento, tironeo, opresin o Dentist al [employer] ejercicios. Detngase de inmediato si siente un dolor repentino o Community education officer. No comience a hacer estos ejercicios hasta que se lo indique el mdico. Ejercicios de elongacin y amplitud de movimiento Estos ejercicios calientan los msculos y las articulaciones, y mejoran el movimiento y la flexibilidad de la espalda. Estos ejercicios tambin ayudan a aliviar el dolor, el adormecimiento y el hormigueo. Rotacin lumbar  Acustese boca arriba sobre una cama firme o sobre el suelo con las rodillas dobladas. Coloque los brazos extendidos a los lados de modo que cada brazo forme un ngulo de 90 grados (ngulo recto) con respecto al cuerpo. Mueva lentamente (rote) ambas rodillas hacia un lado del cuerpo hasta que sienta un estiramiento en la parte inferior de la espalda (zona lumbar). Trate de no levantar los hombros del piso. Mantenga esta posicin durante __________ segundos. Tensione los msculos abdominales y lentamente lleve las rodillas a la posicin inicial. Repita este ejercicio del otro lado del cuerpo. Repita __________ veces. Realice este ejercicio __________ veces al da. Rodilla al pecho  Acustese boca arriba sobre una cama firme o sobre el suelo con las piernas extendidas. Flexione una rodilla. Tome la rodilla con las manos y llvela hacia el pecho hasta que sienta un estiramiento suave en la parte inferior de la espalda y las nalgas. Mantenga la pierna en esta posicin tomando la parte frontal de la rodilla. Mantenga la otra pierna lo ms extendida posible. Mantenga esta posicin durante __________ segundos. Vuelva lentamente a la  posicin inicial. Repita el ejercicio con la otra pierna. Repita __________ veces. Realice este ejercicio __________ veces al da. Extensin United Stationers codos, en decbito prono  Recustese boca abajo en una cama firme o en el piso (posicin prona). [employer] K[employer] Sons codos. Con los brazos, aydese a Haematologist sentir un leve estiramiento en el abdomen y la parte inferior de la espalda. Esto Paramedic de [employer] codos. Si no se siente cmodo, intente colocando almohadas debajo del pecho. Debe dejar la cadera inmvil sobre la superficie en la que est apoyado. Mantenga la cadera y los msculos de la espalda relajados. Mantenga esta posicin durante __________ segundos. Afloje lentamente la parte superior del cuerpo y vuelva a la posicin inicial. Repita __________ veces. Realice este ejercicio __________ veces al da. Ejercicios de fortalecimiento Estos ejercicios fortalecen la espalda y le otorgan resistencia. La resistencia es la capacidad de usar los msculos durante un tiempo prolongado, incluso despus de que se cansen. Inclinacin de la pelvis Este ejercicio fortalece los msculos que se encuentran en la parte profunda del abdomen. Acustese boca arriba sobre una cama dura o sobre el suelo con las piernas extendidas. Flexione las rodillas de modo que apunten al techo y los pies queden apoyados en el suelo. Contraiga los msculos de la parte baja del abdomen para empujar la zona lumbar contra el suelo. Con este movimiento se inclinar la pelvis de modo que el coxis apunte hacia el techo, en lugar de apuntar a los pies o al suelo. Para realizar este ejercicio, puede colocar una toalla pequea debajo de la parte inferior de la espalda y presionar la espalda contra la  toalla. Mantenga esta posicin durante __________ segundos. Relaje totalmente los msculos antes de repetir el ejercicio. Repita __________ veces. Realice este ejercicio __________ veces al  da. Elevaciones alternadas de pierna y brazo  Apoye las palmas de las manos y las rodillas sobre una superficie firme. Si est sobre un suelo duro, puede usar un elemento acolchado, como una alfombrilla para ejercicios, para apoyar las rodillas. Alinee los brazos y las piernas. Las manos deben estar justo debajo de los hombros, y las rodillas debajo de la cadera. Eleve la pierna [employer]. Al mismo tiempo, eleve el brazo derecho y Associate Professor frente a usted. No eleve la pierna por encima de la cadera. No eleve el brazo por encima del hombro. Mantenga los msculos del abdomen y de la espalda contrados. Mantenga la cadera mirando hacia el suelo. No arquee la espalda. Mantenga el equilibrio con cuidado y no contenga la respiracin. Mantenga esta posicin durante __________ segundos. Vuelva lentamente a la posicin inicial. Repita con su pierna derecha y su brazo izquierdo. Repita __________ veces. Realice este ejercicio __________ veces al da. Serie de abdominales con levantamiento de pierna extendida  Acustese boca arriba sobre una cama firme o sobre el suelo. Flexione una rodilla y mantenga la otra pierna extendida. Tensione los msculos abdominales y levante la pierna extendida, a unas 4 o 6 pulgadas (10 o 15 cm) del suelo. Mantenga apretados los msculos abdominales y sostenga esta posicin durante __________ segundos. No contenga la respiracin. No arquee la espalda. Mantngala plana contra el suelo. Mantenga tensos los msculos abdominales mientras baja lentamente la pierna hasta la posicin inicial. Repita el ejercicio con la otra pierna. Repita __________ veces. Realice este ejercicio __________ veces al da. Bajar una pierna con rodillas flexionadas Acustese boca arriba sobre una cama firme o sobre el suelo. Apriete los msculos abdominales y [employer] del piso, uno a la vez, de modo que las rodillas y la cadera estn flexionadas en ngulos de 90 grados  (ngulos rectos). Las rodillas deben estar por encima de la cadera y las pantorrillas deben quedar paralelas al piso. Con los msculos abdominales tensos y la rodilla flexionada, baje lentamente una pierna de modo que los dedos del pie toquen el suelo. Levante la pierna para volver a la posicin inicial. No contenga la respiracin. No deje que la espalda se arquee. Mantenga la espalda plana contra el suelo. Repita el ejercicio con la otra pierna. Repita __________ veces. Realice este ejercicio __________ veces al da. Postura y mecnica corporal La buena postura y la mecnica corporal saludable pueden ayudar a Acupuncturist estrs en las articulaciones y los tejidos del cuerpo. La Water quality scientist se refiere a los movimientos y a las posiciones del cuerpo mientras realiza las actividades diarias. La postura es una parte de la Water quality scientist. Ebb Goldman buena postura significa que: La columna est en su posicin natural de curvatura en S (neutral). Los hombros estn [employer]. [employer]ley. La cabeza no est inclinada hacia adelante (neutral). Siga esas pautas para mejorar la postura y Regulatory affairs officer en sus actividades diarias. De pie  Al estar de pie, mantenga la columna en la posicin neutral y los pies separados al ancho de caderas, aproximadamente. Mantenga las rodillas ligeramente flexionadas. [place], los hombros y las caderas deben estar alineados. Cuando realice una tarea en la que deba estar de pie en el mismo sitio durante mucho tiempo, coloque un pie en un objeto estable de 2 a 4 pulgadas (5 a 10 cm) de alto, como  un taburete. Esto ayuda a que la columna mantenga una posicin neutral. Sentado  Cuando est sentado, mantenga la columna en posicin neutral y deje los pies apoyados en el suelo. Use un apoyapis, si es necesario, y FedEx muslos paralelos al suelo. Evite redondear los hombros e inclinar la cabeza hacia adelante. Cuando trabaje en un escritorio o con una  computadora, el escritorio debe estar a una altura en la que las manos estn un poco ms abajo que los codos. Deslice la silla debajo del escritorio, de modo de estar lo suficientemente cerca como para mantener una buena [place]. Cuando trabaje con una computadora, coloque el monitor a una altura que le permita mirar derecho hacia adelante, sin tener que inclinar la cabeza hacia adelante o [place] atrs. Reposo Al descansar o estar acostado, evite las posiciones que le causen ms dolor. Si siente dolor al Kellogg que exigen sentarse, inclinarse, agacharse o ponerse en cuclillas, acustese en una posicin en la que el cuerpo no deba doblarse mucho. Por ejemplo, evite acurrucarse de costado con los brazos y las rodillas cerca del pecho (posicin fetal). Si siente dolor con las actividades que exigen estar de pie durante mucho tiempo o Furniture conservator/restorer los brazos, acustese con la columna en una posicin neutral y flexione ligeramente las rodillas. Pruebe con las siguientes posiciones: Acostarse de costado con una almohada entre las rodillas. Acostarse boca arriba con una almohada debajo de las rodillas. Levantar objetos  Cuando tenga que levantar un objeto, mantenga los pies separados el ancho de los hombros y apriete los msculos abdominales. Flexione las rodillas y la cadera, y Dietitian la columna en posicin neutral. Es importante levantarse utilizando la fuerza de las piernas, no de la espalda. No trabe las rodillas hacia afuera. Siempre pida ayuda a otra persona para levantar objetos pesados o incmodos. Esta informacin no tiene Theme park manager el consejo del mdico. Asegrese de hacerle al mdico cualquier pregunta que tenga. Document Revised: 11/24/2020 Document Reviewed: 11/24/2020 Elsevier Patient Education  2024 Elsevier Inc. Ejercicios de Gaffer Los ejercicios de manos pueden ser tiles para casi cualquier persona. Pueden fortalecer las manos y mejorar el movimiento y la  flexibilidad. Los ejercicios tambin pueden aumentar el flujo sanguneo a las manos. Estos resultados [employer]orporated sea ms fcil encargarse del trabajo y las tareas diarias. Los ejercicios de manos pueden ser especialmente provechosos para la gente que tiene dolor articular provocado por la artritis o tienen dao nervioso por usar las manos continuamente. Estos ejercicios tambin pueden ayudar a las personas que se lesionan [place]. Ejercicios La mayor parte de estos ejercicios de manos son suaves ejercicios de elongacin y [place]. Generalmente es seguro hacerlos durante todo Medical laboratory scientific officer. Calentarse las manos antes del ejercicio puede ayudar a reducir la rigidez. Puede hacer esto con un masaje suave o sumergiendo las manos en agua tibia durante 10 a 15 minutos. Es normal sentir Science writer, tironeo, opresin o un leve malestar cuando comienza a Biochemist, clinical. Con el tiempo, esto mejorar. Recuerde siempre tener cuidado y detenerse de inmediato si siente un dolor repentino y muy intenso o si el dolor empeora. Usted debe mejorar y [employer]. Pregunte al mdico qu ejercicios son seguros para usted. Haga los ejercicios exactamente como se lo haya indicado el mdico y gradelos como se lo hayan indicado. No comience a hacer estos ejercicios hasta que se lo indique el mdico. Flexin de nudillos o puo "garra"  Prese o sintese con el brazo, la  mano y los cinco dedos apuntando [place]. Asegrese de FedEx en posicin recta. Flexione suavemente los dedos hacia abajo, hacia la palma de la mano, hasta que las puntas de los dedos toquen la palma de la [place]. Mantenga el nudillo grande estirado y solamente flexione los nudillos pequeos en los dedos. Mantenga esta posicin durante 10 segundos. Estire los dedos nuevamente hasta la posicin inicial. Repita este ejercicio entre 5 y 10 veces con cada mano. Puo con todo el dedo  Prese o sintese con el brazo, la  mano y los cinco dedos apuntando [place]. Asegrese de FedEx en posicin recta. Flexione suavemente los dedos hacia la palma de la mano, hasta que las puntas de los dedos toquen la parte media de la palma de la [place]. Mantenga esta posicin durante 10 segundos. Extienda los dedos nuevamente hasta la posicin inicial, estirando completamente cada articulacin. Repita este ejercicio entre 5 y 10 veces con cada mano. Puo extendido  Prese o sintese con el brazo, la mano y los cinco dedos apuntando [place]. Asegrese de FedEx en posicin recta. Flexione suavemente los dedos en el nudillo grande, donde los dedos se unen a la [place], y en el [place]. Mantenga el nudillo de la punta de los dedos estirado y trate de tocarse la parte inferior de la palma de la [place]. Mantenga esta posicin durante 10 segundos. Extienda los dedos nuevamente hasta la posicin inicial, estirando completamente cada articulacin. Repita este ejercicio entre 5 y 10 veces con cada mano. La mesa  Prese o sintese con el brazo, la mano y los cinco dedos apuntando [place]. Asegrese de FedEx en posicin recta. Doble suavemente los dedos en el nudillo ms grande, donde los dedos se unen a la mano, lo ms lejos que pueda hacia abajo. Mantenga estirados los nudillos pequeos de los dedos. Piense en formar una mesa con los dedos. Mantenga esta posicin durante 10 segundos. Extienda los dedos nuevamente hasta la posicin inicial, estirando completamente cada articulacin. Repita este ejercicio entre 5 y 10 veces con cada mano. Dispersin de dedos  Apoye la mano horizontal sobre una mesa con la palma hacia abajo. Asegrese de que la [place] se mantenga en posicin recta. Separe todos los dedos uno de otro tanto como pueda hasta sentir un ligero estiramiento. Mantenga esta posicin durante 10 segundos. Vuelva a juntar todos los dedos. Mantenga esta posicin durante 10  segundos. Repita este ejercicio entre 5 y 10 veces con cada mano. Hacer crculos  Prese o sintese con el brazo, la mano y los cinco dedos apuntando [place]. Asegrese de FedEx en posicin recta. Haga un crculo tocando la punta del pulgar con la punta del dedo ndice. Mantenga durante 10 segundos. Luego abra la mano completamente. Repita este movimiento con Multimedia programmer y cada uno de los otros dedos. Repita este ejercicio entre 5 y 10 veces con cada mano. Movimiento del pulgar  Sintese con el antebrazo apoyado sobre una mesa y la [place]. El pulgar debe apuntar hacia arriba, hacia el techo. Mantenga los otros dedos relajados mientras mueve el pulgar. Levante el pulgar hacia arriba lo ms alto que pueda hacia el techo. Mantenga durante 10 segundos. Doble el pulgar por la palma lo ms lejos que pueda, tratando de llegar con la punta del pulgar hasta el lado del dedo pequeo (meique) de la palma de la [place]. Mantenga durante 10 segundos. Repita este ejercicio entre 5 y 10 veces con  cada mano. Fortalecimiento del agarre  Mudlogger pelota para el estrs u otra pelota blanda en el medio de la [place]. Aumente lentamente la presin, apretando la pelota tanto como pueda sin causar dolor. Piense en llevar las puntas de los dedos hacia el centro de la palma. Todas las articulaciones de los dedos deben doblarse al hacer este ejercicio. Mantenga la presin durante 10 segundos y luego relaje. Repita este ejercicio entre 5 y 10 veces con cada mano. Comunquese con un mdico si: El dolor o las molestias en la mano se vuelven mucho ms intensas cuando hace un ejercicio. El dolor o las molestias en la mano no mejoran en el trmino de las 2 horas posteriores a Copy. Si tiene alguno de [employer] Brands, deje de [employer] Corporation ejercicios de inmediato. No vuelva a hacerlos a menos que el mdico lo autorice. Solicite ayuda de inmediato si: Presenta hinchazn o dolor sbito e  intenso en la mano. Si esto ocurre, deje de Museum/gallery exhibitions officer ejercicios de inmediato. No vuelva a hacerlos a menos que el mdico lo autorice. Esta informacin no tiene Theme park manager el consejo del mdico. Asegrese de hacerle al mdico cualquier pregunta que tenga. Document Revised: 09/17/2022 Document Reviewed: 09/17/2022 Elsevier Patient Education  2024 [employer].  Artrosis Osteoarthritis  La artrosis es un tipo de artritis. Esta afeccin produce dolor o enfermedad en las articulaciones. La artrosis afecta al tejido que cubre los extremos de los huesos en las articulaciones (cartlago). El cartlago acta como amortiguador entre los huesos y los ayuda a moverse con suavidad. La artrosis se presenta cuando el cartlago de las articulaciones se gasta. A veces, la artrosis se denomina artritis "por uso y desgaste". La artrosis es la forma ms frecuente de artritis. A menudo, afecta a las [employer]. Es una afeccin que empeora con el [place]. Las articulaciones afectadas con mayor frecuencia por esta afeccin se encuentran en los dedos de Washington [employer], los dedos de [employer], las caderas, las rodillas y la columna vertebral, incluyendo el cuello y la parte inferior de la espalda. Cules son las causas? Esta afeccin es causada por el desgaste del cartlago que cubre los extremos de [place]. Qu incrementa el riesgo? Los siguientes factores pueden hacer que sea ms propenso a Aeronautical engineer afeccin: Ser mayor de 50 aos. Obesidad. Uso excesivo de la articulacin. Lesin pasada de una articulacin. Ciruga pasada en una articulacin. Antecedentes familiares de artrosis. Cules son los signos o sntomas? Los principales sntomas de esta enfermedad son dolor, hinchazn y rigidez en la articulacin. Otros sntomas pueden incluir: Agrandamiento de la articulacin. Aumento del dolor y dao adicional causado por pequeos trozos de Dow Chemical o TEFL teacher que se desprenden y flotan dentro de la  articulacin. Formacin de pequeos depsitos de hueso (osteofitos) en los extremos de la articulacin. Una sensacin de chirrido o raspado dentro de la articulacin al moverla. Sonidos de chasquido o crujido al [employer] Company. Dificultad para caminar o hacer ejercicio. Incapacidad para agarrar objetos, girar la mano o controlar los movimientos de las manos y los dedos. Cmo se diagnostica? Esta afeccin se puede diagnosticar en funcin de lo siguiente: Sus antecedentes mdicos. Un examen fsico. Los sntomas. Radiografas de las articulaciones afectadas. Anlisis de sangre para descartar otros tipos de artritis. Cmo se trata? No hay cura para esta enfermedad, pero el tratamiento puede ayudar a Human resources officer y Scientist, clinical (histocompatibility and immunogenetics) el funcionamiento de la articulacin. El tratamiento puede incluir una combinacin de [place], [place] las siguientes: Financial risk analyst de alivio  del dolor, como: Aplicacin de calor y fro en la articulacin. Masajes. Una forma de psicoterapia llamada terapia cognitivo conductual (TCC). Esta terapia le ayuda a Consulting civil engineer y a Education officer, environmental un seguimiento de los cambios que hace. Analgsicos y antiinflamatorios. Los medicamentos pueden tomarse por boca o [employer] Corporation. Incluyen los siguientes: Antiinflamatorios no esteroideos (AINE), como el ibuprofeno. Medicamentos recetados. Antiinflamatorios fuertes (corticoesteroides). Ciertos suplementos nutricionales. Un programa de ejercicios recomendado. Puede trabajar con un fisioterapeuta. Dispositivos de ayuda, como un dispositivo ortopdico, una frula, un guante especial o un bastn. Un plan de control del peso. [place], como: Rochell Chroman. Se hace para volver a posicionar los huesos y Engineer, materials o para Oceanographer los trozos sueltos de hueso y TEFL teacher. Ciruga de reemplazo articular. Es posible que necesite esta ciruga si tiene una artrosis [place]. Siga estas indicaciones en su casa: Actividad Descanse las  articulaciones afectadas como se lo haya indicado el mdico. Haga actividad fsica como se lo haya indicado el mdico. El mdico puede recomendar tipos especficos de ejercicios, por ejemplo: Ejercicios de fortalecimiento. Se realizan para fortalecer los msculos que sostienen las articulaciones afectadas por la artritis. Ejercicios aerbicos. Son ejercicios, como caminar a paso ligero o hacer gimnasia aerbica acutica, que aumentan la frecuencia cardaca. Actividades de amplitud de movimientos. Estos ayudan a que las articulaciones se muevan con ms facilidad. Ejercicios de equilibrio y [place]. Control del dolor, la rigidez y la hinchazn     Si se lo indican, aplique calor en la zona afectada con la frecuencia que le haya dicho el mdico. Use la fuente de calor que el mdico le recomiende, como una compresa de calor hmedo o una almohadilla trmica. Si tiene un dispositivo de [employer] Kiewit Sons se puede quitar, quteselo segn lo indicado por su mdico. Coloque una toalla entre la piel y la fuente de calor. Si el mdico le indica que no se quite el dispositivo de USAA se Tax inspector, coloque una toalla entre el dispositivo de ayuda y la fuente de calor. Aplique calor durante 20 a 30 minutos. Si se lo indican, aplique hielo en la zona afectada. Si tiene un dispositivo de ayuda que se puede quitar, quteselo segn lo indicado por su mdico. Ponga el hielo en una bolsa plstica. Coloque una toalla entre la piel y la bolsa. Si el mdico le indica que no se quite el dispositivo de USAA se aplica hielo, coloque una toalla entre el dispositivo de ayuda y la bolsa de hielo. Aplique el hielo durante 20 minutos, 2 o 3 veces por da. Si la piel se le pone de color rojo brillante, retire el hielo o Company secretary de inmediato para evitar daos en la piel. El [place] de dao es mayor si no puede sentir dolor, Airline pilot o fro. Mueva los dedos de las manos o de los pies con frecuencia para reducir la  rigidez y la hinchazn. Cuando est sentado o acostado, levante (eleve) la zona afectada por encima del nivel del corazn. Indicaciones generales Use los medicamentos de venta libre y los recetados solamente como se lo haya indicado el mdico. Mantenga un peso saludable. Siga las instrucciones del mdico con respecto al control del [place]. No consuma ningn producto que contenga nicotina o tabaco. Estos productos incluyen cigarrillos, tabaco para Theatre manager y aparatos de vapeo, como los cigarrillos electrnicos. Si necesita ayuda para dejar de consumir estos productos, consulte al American Express. Use los dispositivos de ayuda como se lo haya indicado el mdico. Dnde buscar ms informacin  [employer] of Arthritis and Musculoskeletal and Skin Diseases (Instituto [employer] de Artritis y Herrick Musculoesquelticas y Arboriculturist): niams.http://www.myers.net/ [employer] Mills on Aging Lawyer sobre el Envejecimiento): BaseRingTones.pl Cel[employer] Corporation of Rheumatology (Instituto Estadounidense de Reumatologa): rheumatology.org Comunquese con un mdico si: Tiene enrojecimiento, hinchazn o sensacin de calor que empeora en una articulacin. Tiene fiebre y siente dolor en la articulacin o el msculo. Presenta una erupcin cutnea. Tiene dificultad para [employer] Energy cotidianas. Siente dolor que empeora y no se alivia con los analgsicos. Esta informacin no tiene Theme park manager el consejo del mdico. Asegrese de hacerle al mdico cualquier pregunta que tenga. Document Revised: 05/10/2022 Document Reviewed: 05/10/2022 Elsevier Patient Education  2024 Elsevier Inc.  Sndrome de dolor miofascial y fibromialgia Myofascial Pain Syndrome and Fibromyalgia El sndrome de dolor miofascial y la fibromialgia son trastornos del Engineer, mining. Este dolor puede sentirse, principalmente, en los msculos. Sndrome de dolor miofascial: Siempre se presenta con puntos sensibles al tacto en los msculos que  causan dolor ante la compresin (puntos neurlgicos). El dolor puede aparecer y Geneticist, molecular. Generalmente, afecta el cuello, la parte superior de la espalda y las zonas de los hombros. El dolor suele desplazarse a los brazos y [place]. Fibromialgia: Cursa con dolores musculares y dolor a la palpacin que aparecen y desaparecen. Suele asociarse con cansancio (fatiga) y problemas del sueo. Cursa con puntos neurlgicos. Suele ser de larga duracin (crnica), pero no es potencialmente mortal. La fibromialgia y el sndrome de dolor miofascial no son lo mismo. Sin embargo, suelen presentarse juntos. Si tiene ambas afecciones, pueden intensificarse entre s. Ambas afecciones son frecuentes y pueden causar bastante dolor y fatiga como para dificultar las actividades cotidianas. Ambas afecciones pueden ser difciles de diagnosticar porque tienen sntomas que son frecuentes en muchas otras afecciones. Cules son las causas? Se desconocen las causas exactas de estas afecciones. Qu incrementa el riesgo? Es ms probable que presente cualquiera de estas afecciones si: Tiene antecedentes familiares de la afeccin. Es mujer. Tiene ciertos factores desencadenantes, por ejemplo: Dolores de columna. Una lesin (traumatismo) u otros factores estresantes fsicos. Estar bajo mucho estrs. Afecciones mdicas como artrosis, artritis reumatoide o lupus. Cules son los signos o sntomas? Fibromialgia El sntoma principal de la fibromialgia es el dolor ampliamente distribuido y la sensibilidad al tacto en los msculos. A veces, el dolor se describe como punzante, fulgurante o urente. Es posible que tambin le hagan los siguientes estudios: Hormigueo o entumecimiento. Problemas al dormir y Management consultant. Problemas con la atencin y la concentracin (disfuncin cognitiva). Otros sntomas pueden incluir: Problemas de intestino y vejiga. Dolores de [place]. Problemas de visin. Sensibilidad a los olores y  ruidos. Depresin o cambios en el estado de nimo. Perodos menstruales dolorosos (dismenorrea). Sequedad de la piel o los ojos. Estos sntomas pueden variar con [employer]. Sndrome de [employer] Los sntomas del sndrome de dolor miofascial incluyen lo siguiente: Bandas musculares tensas y fibrosas. Sensaciones de incomodidad en las zonas musculares. Estas pueden incluir dolor, calambres, ardor, adormecimiento, hormigueo y debilidad. Dificultad para mover ciertas partes del cuerpo libremente (poca amplitud de movimiento). Cmo se diagnostica? Esta afeccin puede diagnosticarse en funcin de los sntomas y los antecedentes mdicos. Adems, se le Charity fundraiser fsico. En general: La fibromialgia se diagnostica cuando la persona tiene Engineer, mining, fatiga y otros sntomas durante ms de tres meses y esos sntomas no se pueden atribuir a Hospital doctor. El sndrome de dolor miofascial se diagnostica cuando la persona tiene puntos neurlgicos en los msculos que son  sensibles al tacto y causan dolor en otra parte del cuerpo (dolor referido). Cmo se trata? El tratamiento para estas afecciones depende del tipo de afeccin que tenga. Para la fibromialgia, un estilo de vida saludable es el tratamiento ms importante, e incluye ejercicios aerbicos y de fuerza. Se utilizan diferentes tipos de medicamentos para tratar el dolor, como los siguientes: Antiinflamatorios no esteroides (AINE). Medicamentos para tratar la depresin. Medicamentos para ayudar a Chief Operating Officer las convulsiones. Medicamentos que [employer]. El tratamiento para sndrome del dolor miofascial incluye lo siguiente: Analgsicos, como los antiinflamatorios no esteroideos (AINE). Relajacin y elongacin de los msculos. Terapia de masaje con tcnica de liberacin miofascial. Inyecciones en los puntos neurlgicos. El tratamiento de estas afecciones a menudo requiere la participacin de un equipo de mdicos. Pueden  incluir: El mdico de cabecera. Un fisioterapeuta. Mdicos complementarios, como masoterapeutas o acupunturistas. Un psiquiatra para la terapia cognitivo conductual. Siga estas instrucciones en su casa: Medicamentos Use los medicamentos de venta libre y los recetados solamente como se lo haya indicado el mdico. Pregntele al mdico si el medicamento recetado: Requiere que evite conducir o usar [place]. Puede causarle estreimiento. Es posible que tenga que tomar estas medidas para prevenir o tratar el estreimiento: Product manager suficiente lquido como para Pharmacologist la orina de color amarillo plido. Usar medicamentos recetados o de Sales promotion account executive. Consumir alimentos ricos en fibra, como frijoles, cereales integrales, y frutas y verduras frescas. Limitar el consumo de alimentos ricos en grasa y azcares procesados, como los alimentos fritos o dulces. Estilo de [employer] ejercicios como se lo haya indicado el mdico o el fisioterapeuta. Practique tcnicas de relajacin para mantener el estrs bajo control. Tal vez deba intentar lo siguiente: Biorretroalimentacin. Formacin de imgenes visuales. Hipnosis. Relajacin muscular. Yoga. Meditacin. Lleve un estilo de vida saludable. Esto incluye consumir una dieta saludable y dormir lo suficiente. No consuma ningn producto que contenga nicotina o tabaco. Estos productos incluyen cigarrillos, tabaco para Theatre manager y aparatos de vapeo, como los cigarrillos electrnicos. Si necesita ayuda para dejar de fumar, consulte al mdico. Instrucciones generales Hable con el mdico sobre los tratamientos complementarios, como acupuntura o [place]. No realice actividades que le generen tensin o sobrecarga muscular. Esto incluye hacer movimientos repetitivos y Lexicographer objetos pesados. Concurra a todas las visitas de seguimiento. Esto es importante. Dnde obtener apoyo Considere la posibilidad de unirse a un grupo de apoyo para personas a quienes les  diagnosticaron esta afeccin. National Fibromyalgia Association (Asociacin Nacional de Fibromialgia): fmaware.org Dnde obtener ms informacin U.S. Pain Foundation (Fundacin Estadounidense del Dolor): uspainfoundation.org Comunquese con un mdico si: Aparecen nuevos sntomas. Los sntomas empeoran o el dolor es intenso. Los [employer] causan efectos secundarios. Tiene dificultad para dormir. La afeccin le causa depresin o ansiedad. Solicite ayuda de inmediato si: Piensa acerca de lastimarse o lastimar a Economist. Busque ayuda de inmediato si alguna vez siente que puede hacerse dao a usted mismo o a otros, o tiene pensamientos de Patent examiner a su vida. Dirjase al centro de urgencias ms cercano o: Llame al 911. Llame a National Suicide Prevention Lifeline (Lnea Telefnica Nacional para la Prevencin del Suicidio) al 843-887-3078 o al 988. Est disponible las 24 horas del da. Enve un mensaje de texto a la lnea para casos de crisis al [phone]. Esta informacin no tiene Theme park manager el consejo del mdico. Asegrese de hacerle al mdico cualquier pregunta que tenga. Document Revised: 06/15/2022 Document Reviewed: 07/28/2021 Elsevier Patient Education  2024 [employer].

## 2023-12-15 ENCOUNTER — Encounter: Payer: Self-pay | Admitting: Internal Medicine

## 2023-12-15 ENCOUNTER — Ambulatory Visit (INDEPENDENT_AMBULATORY_CARE_PROVIDER_SITE_OTHER): Payer: PRIVATE HEALTH INSURANCE | Admitting: Internal Medicine

## 2023-12-15 VITALS — BP 148/86 | HR 76 | Temp 98.0°F | Ht 62.5 in | Wt 152.0 lb

## 2023-12-15 DIAGNOSIS — I10 Essential (primary) hypertension: Secondary | ICD-10-CM

## 2023-12-15 DIAGNOSIS — R051 Acute cough: Secondary | ICD-10-CM

## 2023-12-15 DIAGNOSIS — R062 Wheezing: Secondary | ICD-10-CM | POA: Diagnosis not present

## 2023-12-15 MED ORDER — AZITHROMYCIN 250 MG PO TABS: ORAL_TABLET | ORAL | 1 refills | Status: AC

## 2023-12-15 MED ORDER — ALBUTEROL SULFATE HFA 108 (90 BASE) MCG/ACT IN AERS: 2.0000 | INHALATION_SPRAY | Freq: Four times a day (QID) | RESPIRATORY_TRACT | 2 refills | Status: AC | PRN

## 2023-12-15 MED ORDER — HYDROCODONE BIT-HOMATROP MBR 5-1.5 MG/5ML PO SOLN: 5.0000 mL | Freq: Four times a day (QID) | ORAL | 0 refills | Status: AC | PRN

## 2023-12-15 MED ORDER — PREDNISONE 10 MG PO TABS: ORAL_TABLET | ORAL | 0 refills | Status: DC

## 2023-12-15 MED ORDER — METHYLPREDNISOLONE ACETATE 80 MG/ML IJ SUSP
80.0000 mg | Freq: Once | INTRAMUSCULAR | Status: AC
Start: 2023-12-15 — End: 2023-12-15
  Administered 2023-12-15: 80 mg via INTRAMUSCULAR

## 2023-12-15 NOTE — Progress Notes (Signed)
 Patient ID: Julia Green, female   DOB: Mar 08, 1971, 53 y.o.   MRN: [id number]        Chief Complaint: follow up cough, wheezing, htn       HPI:  Julia Green is a 53 y.o. female Here with acute onset mild to mod 2-3 days ST, HA, general weakness and malaise, with prod cough greenish sputum, but Pt denies chest pain, orthopnea, PND, increased LE swelling, palpitations, dizziness or syncope except has mild sob and wheezing for several days getting worse.   Pt denies polydipsia, polyuria, or new focal neuro s/s.        Wt Readings from Last 3 Encounters:  12/15/23 152 lb (68.9 kg)  12/14/23 152 lb (68.9 kg)  12/05/23 154 lb 3.2 oz (69.9 kg)   BP Readings from Last 3 Encounters:  12/15/23 (!) 148/86  12/14/23 (!) 152/101  12/05/23 (!) 138/90         Past Medical History:  Diagnosis Date   Arthritis    Asthma    Depression    Fatty liver    Gallstones    GERD (gastroesophageal reflux disease)    Headache    History of low potassium    Hypertension    Hypothyroidism    Right arm numbness    Right leg numbness    Sciatic nerve pain    Thyroid disease    Urticaria    Past Surgical History:  Procedure Laterality Date   APPENDECTOMY     CARPAL TUNNEL RELEASE Right 08/07/2019   Procedure: RIGHT CARPAL TUNNEL RELEASE;  Surgeon: Wes Hamman, MD;  Location: [facility];  Service: Orthopedics;  Laterality: Right;   COLONOSCOPY     LAPAROSCOPIC RIGHT HEMI COLECTOMY N/A 11/26/2020   Procedure: LAPAROSCOPIC RIGHT HEMI COLECTOMY;  Surgeon: Melvenia Stabs, MD;  Location: [facility];  Service: General;  Laterality: N/A;   LAPAROSCOPIC VAGINAL HYSTERECTOMY WITH SALPINGECTOMY Bilateral 06/26/2017   Procedure: LAPAROSCOPIC ASSISTED VAGINAL HYSTERECTOMY WITH SALPINGECTOMY;  Surgeon: Lavoie, Marie-Lyne, MD;  Location: [facility];  Service: Gynecology;  Laterality: Bilateral;  request 7:30am OR time  requests 2 hours Rex the lighted retractor rep will be here     UPPER GI ENDOSCOPY      reports that she has never smoked. She has never been exposed to tobacco smoke. She has never used smokeless tobacco. She reports that she does not drink alcohol and does not use drugs. family history includes Asthma in her mother; Cancer in her sister; Eczema in her niece; High blood pressure in her father; Hypertension in her mother; Kidney failure in her father and sister; Lung disease in her father; Stomach cancer in her maternal aunt; Uterine cancer in her mother. No Known Allergies Current Outpatient Medications on File Prior to Visit  Medication Sig Dispense Refill   albuterol  (ACCUNEB ) 1.25 MG/3ML nebulizer solution Take by nebulization every 6 (six) hours as needed.     aspirin EC 81 MG tablet Take 81 mg by mouth daily.     azelastine  (ASTELIN ) 0.1 % nasal spray Place 2 sprays into both nostrils 2 (two) times daily. Use in each nostril as directed 30 mL 5   budesonide  (PULMICORT ) 0.25 MG/2ML nebulizer solution Take 2 mLs (0.25 mg total) by nebulization in the morning and at bedtime. 120 mL 3   calcium carbonate (OSCAL) 1500 (600 Ca) MG TABS tablet Take 1,500 mg by mouth daily.     cetirizine-pseudoephedrine (ZYRTEC-D) 5-120 MG tablet  Take 1 tablet by mouth every 12 (twelve) hours.     DULoxetine  (CYMBALTA ) 30 MG capsule Take 1 capsule (30 mg total) by mouth daily. 90 capsule 3   estradiol  (ESTRACE ) 1 MG tablet Take 1 tablet (1 mg total) by mouth daily. 90 tablet 4   levocetirizine (XYZAL ) 5 MG tablet Take 1 tablet (5 mg total) by mouth every evening. 20 tablet 1   levothyroxine  (SYNTHROID ) 112 MCG tablet TAKE 1 TABLET BY MOUTH EVERY MORNING*RETURN TO CLINIC IN 6 WEEKS FOR TSH RECHECK 90 tablet 3   losartan -hydrochlorothiazide  (HYZAAR) 100-12.5 MG tablet Take 1 tablet by mouth daily. 90 tablet 3   montelukast  (SINGULAIR ) 10 MG tablet Take 1 tablet (10 mg total) by mouth at bedtime. 90 tablet 3   Omega-3 Fatty Acids (FISH OIL PO) Take by mouth.     omeprazole   (PRILOSEC) 40 MG capsule Take 1 capsule (40 mg total) by mouth daily. 90 capsule 1   vitamin C (ASCORBIC ACID) 500 MG tablet Take 500 mg by mouth daily.     fluticasone  (FLONASE ) 50 MCG/ACT nasal spray Place 2 sprays into both nostrils daily. (Patient not taking: Reported on 12/15/2023) 16 g 3   No current facility-administered medications on file prior to visit.        ROS:  All others reviewed and negative.  Objective        PE:  BP (!) 148/86 (BP Location: Left Arm, Patient Position: Sitting, Cuff Size: Normal)   Pulse 76   Temp 98 F (36.7 C) (Oral)   Ht 5' 2.5" (1.588 m)   Wt 152 lb (68.9 kg)   LMP 03/24/2017 Comment: not sexually active  SpO2 100%   BMI 27.36 kg/m                 Constitutional: Pt appears mild ill               HENT: Head: NCAT.                Right Ear: External ear normal.                 Left Ear: External ear normal. Bilat tm's with mild erythema.  Max sinus areas non tender.  Pharynx with mild erythema, no exudate                 Eyes: . Pupils are equal, round, and reactive to light. Conjunctivae and EOM are normal               Nose: without d/c or deformity               Neck: Neck supple. Gross normal ROM               Cardiovascular: Normal rate and regular rhythm.                 Pulmonary/Chest: Effort normal and breath sounds without rales or wheezing.                               Neurological: Pt is alert. At baseline orientation, motor grossly intact               Skin: Skin is warm. No rashes, no other new lesions, LE edema - none               Psychiatric: Pt behavior is normal without agitation   Micro:  none  Cardiac tracings I have personally interpreted today:  none  Pertinent Radiological findings (summarize): none   Lab Results  Component Value Date   WBC 9.5 05/25/2023   HGB 12.5 05/25/2023   HCT 37.0 05/25/2023   PLT 329.0 05/25/2023   GLUCOSE 105 (H) 05/25/2023   CHOL 189 03/09/2023   TRIG 100.0 03/09/2023   HDL 86.70  03/09/2023   LDLCALC 83 03/09/2023   ALT 22 05/25/2023   AST 26 05/25/2023   NA 140 05/25/2023   K 3.5 05/25/2023   CL 99 05/25/2023   CREATININE 0.83 05/25/2023   BUN 11 05/25/2023   CO2 31 05/25/2023   TSH 2.06 03/09/2023   HGBA1C 5.7 03/09/2023   Assessment/Plan:  [place] L sundy houchins is a 53 y.o. Other or two or more races [6] female with  has a past medical history of Arthritis, Asthma, Depression, Fatty liver, Gallstones, GERD (gastroesophageal reflux disease), Headache, History of low potassium, Hypertension, Hypothyroidism, Right arm numbness, Right leg numbness, Sciatic nerve pain, Thyroid disease, and Urticaria.  Acute cough Mild to mod, for antibx course zpack, cough med prn,  to f/u any worsening symptoms or concerns  Wheezing Mild to mod, for depomedrol 80 mg IM,  prednisone  taper, albuterol  hfa prn,  to f/u any worsening symptoms or concerns  Hypertension BP Readings from Last 3 Encounters:  12/15/23 (!) 148/86  12/14/23 (!) 152/101  12/05/23 (!) 138/90   Uncontrolled, likely reactive, pt to continue medical treatment hyzaar 100 12.5 qd  Followup: Return if symptoms worsen or fail to improve.  Rosalia Colonel, MD 12/15/2023 9:30 PM [facility] Internal Medicine

## 2023-12-15 NOTE — Assessment & Plan Note (Signed)
 BP Readings from Last 3 Encounters:  12/15/23 (!) 148/86  12/14/23 (!) 152/101  12/05/23 (!) 138/90   Uncontrolled, likely reactive, pt to continue medical treatment hyzaar 100 12.5 qd

## 2023-12-15 NOTE — Patient Instructions (Signed)
 You had the steroid shot today  Please take all new medication as prescribed - the antibiotic, cough medicine, prednisone  and inhaler as needed  Please continue all other medications as before, and refills have been done if requested.  Please have the pharmacy call with any other refills you may need.  Please keep your appointments with your specialists as you may have planned

## 2023-12-15 NOTE — Assessment & Plan Note (Signed)
Mild to mod, for antibx course zpack, cough med prn,  to f/u any worsening symptoms or concerns 

## 2023-12-15 NOTE — Assessment & Plan Note (Addendum)
 Mild to mod, for depomedrol 80 mg IM,  prednisone  taper, albuterol  hfa prn,  to f/u any worsening symptoms or concerns

## 2024-01-08 ENCOUNTER — Ambulatory Visit (INDEPENDENT_AMBULATORY_CARE_PROVIDER_SITE_OTHER): Payer: PRIVATE HEALTH INSURANCE | Admitting: Emergency Medicine

## 2024-01-08 ENCOUNTER — Encounter: Payer: Self-pay | Admitting: Emergency Medicine

## 2024-01-08 VITALS — BP 124/88 | HR 76 | Temp 98.6°F | Ht 62.5 in | Wt 151.0 lb

## 2024-01-08 DIAGNOSIS — G478 Other sleep disorders: Secondary | ICD-10-CM | POA: Diagnosis not present

## 2024-01-08 DIAGNOSIS — Z23 Encounter for immunization: Secondary | ICD-10-CM | POA: Diagnosis not present

## 2024-01-08 DIAGNOSIS — R400 Somnolence: Secondary | ICD-10-CM

## 2024-01-08 DIAGNOSIS — I10 Essential (primary) hypertension: Secondary | ICD-10-CM | POA: Diagnosis not present

## 2024-01-08 DIAGNOSIS — R5383 Other fatigue: Secondary | ICD-10-CM | POA: Insufficient documentation

## 2024-01-08 DIAGNOSIS — R29818 Other symptoms and signs involving the nervous system: Secondary | ICD-10-CM | POA: Insufficient documentation

## 2024-01-08 DIAGNOSIS — M2550 Pain in unspecified joint: Secondary | ICD-10-CM

## 2024-01-08 DIAGNOSIS — R002 Palpitations: Secondary | ICD-10-CM

## 2024-01-08 DIAGNOSIS — E038 Other specified hypothyroidism: Secondary | ICD-10-CM

## 2024-01-08 MED ORDER — ZOLPIDEM TARTRATE 5 MG PO TABS: 5.0000 mg | ORAL_TABLET | Freq: Every evening | ORAL | 1 refills | Status: DC | PRN

## 2024-01-08 NOTE — Assessment & Plan Note (Signed)
 Clinically stable.  No red flag signs or symptoms. Recent blood work results reviewed with patient Differential diagnosis reviewed with patient Multifactorial most likely Suspecting obstructive sleep apnea contributing greatly to symptoms Recommend sleep studies Recommend sleeping medication

## 2024-01-08 NOTE — Assessment & Plan Note (Signed)
 Rheumatism.  Was recently evaluated by rheumatologist No clinical findings for lupus Fibromyalgia likely diagnosis Pain management discussed Sleep apnea contributing

## 2024-01-08 NOTE — Assessment & Plan Note (Signed)
Clinically euthyroid. Continue levothyroxine 112 mcg daily.

## 2024-01-08 NOTE — Progress Notes (Signed)
 Julia Green 53 y.o.   Chief Complaint  Patient presents with   Dizziness    Patient states she's been feeling dizzy, she's has been having palpations, she's been having cramps all over her body for about 3 months. Patient also states she needs a refill for all her medications. She is also interested in getting the shingle vaccine     HISTORY OF PRESENT ILLNESS: This is a 53 y.o. female complaining of chronic dizziness with occasional palpitations and diffuse general cramping Tired all the time.  Does not sleep well.  Wakes up tired and feels sleepy during the day.  Has been going on for couple years No other associated symptoms Recently had Zio patch done for palpitations.  Results not available yet. On 12/14/2023 was able to follow-up with rheumatologist.  Does not have lupus clinically.  Fibromyalgia most likely diagnosis.  Office notes reviewed with patient.  Dizziness Pertinent negatives include no abdominal pain, congestion, coughing, nausea, sore throat or vomiting.     Prior to Admission medications   Medication Sig Start Date End Date Taking? Authorizing Provider  albuterol  (ACCUNEB ) 1.25 MG/3ML nebulizer solution Take by nebulization every 6 (six) hours as needed.   Yes [provider]  albuterol  (VENTOLIN  HFA) 108 (90 Base) MCG/ACT inhaler Inhale 2 puffs into the lungs every 6 (six) hours as needed for wheezing or shortness of breath. 12/15/23  Yes Roslyn Coombe, MD  aspirin EC 81 MG tablet Take 81 mg by mouth daily.   Yes [provider]  azelastine  (ASTELIN ) 0.1 % nasal spray Place 2 sprays into both nostrils 2 (two) times daily. Use in each nostril as directed 07/05/22  Yes Patel, Ammie Bale, MD  budesonide  (PULMICORT ) 0.25 MG/2ML nebulizer solution Take 2 mLs (0.25 mg total) by nebulization in the morning and at bedtime. 06/30/22  Yes Kandice Orleans, MD  calcium carbonate (OSCAL) 1500 (600 Ca) MG TABS tablet Take 1,500 mg by mouth daily.   Yes  [provider]  cetirizine-pseudoephedrine (ZYRTEC-D) 5-120 MG tablet Take 1 tablet by mouth every 12 (twelve) hours. 05/30/22  Yes [provider]  DULoxetine  (CYMBALTA ) 30 MG capsule Take 1 capsule (30 mg total) by mouth daily. 06/15/23  Yes Alva Kuenzel, Isidro Margo, MD  estradiol  (ESTRACE ) 1 MG tablet Take 1 tablet (1 mg total) by mouth daily. 02/22/23  Yes Lavoie, Marie-Lyne, MD  levocetirizine (XYZAL ) 5 MG tablet Take 1 tablet (5 mg total) by mouth every evening. 12/05/23  Yes Ousman Dise, Isidro Margo, MD  levothyroxine  (SYNTHROID ) 112 MCG tablet TAKE 1 TABLET BY MOUTH EVERY MORNING*RETURN TO CLINIC IN 6 WEEKS FOR TSH RECHECK 04/21/23  Yes Priseis Cratty, Isidro Margo, MD  losartan -hydrochlorothiazide  (HYZAAR) 100-12.5 MG tablet Take 1 tablet by mouth daily. 03/11/23  Yes Jakarius Flamenco, Isidro Margo, MD  montelukast  (SINGULAIR ) 10 MG tablet Take 1 tablet (10 mg total) by mouth at bedtime. 06/15/23  Yes Kaidance Pantoja, Isidro Margo, MD  Omega-3 Fatty Acids (FISH OIL PO) Take by mouth.   Yes [provider]  omeprazole  (PRILOSEC) 40 MG capsule Take 1 capsule (40 mg total) by mouth daily. 06/15/23  Yes Caymen Dubray, Isidro Margo, MD  vitamin C (ASCORBIC ACID) 500 MG tablet Take 500 mg by mouth daily.   Yes [provider]  zolpidem  (AMBIEN ) 5 MG tablet Take 1 tablet (5 mg total) by mouth at bedtime as needed for sleep. 01/08/24  Yes Leticia Mcdiarmid, Isidro Margo, MD  fluticasone  (FLONASE ) 50 MCG/ACT nasal spray Place 2 sprays into both  nostrils daily. Patient not taking: Reported on 12/14/2023 06/30/22   Kandice Orleans, MD  predniSONE  (DELTASONE ) 10 MG tablet 3 tabs by mouth per day for 3 days,2tabs per day for 3 days,1tab per day for 3 days Patient not taking: Reported on 01/08/2024 12/15/23   Roslyn Coombe, MD    No Known Allergies  Patient Active Problem List   Diagnosis Date Noted   Wheezing 12/15/2023   Rhinorrhea 12/05/2023   Palpitations 12/05/2023   Chronic sinusitis 06/15/2023   Arthralgia of  multiple sites 05/25/2023   Hypokalemia 05/25/2023   Rheumatism 05/25/2023   History of asthma 03/09/2023   Current moderate episode of major depressive disorder without prior episode (HCC) 03/09/2023   Hypertension    Colon cancer (HCC) 11/26/2020   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   Hypothyroidism 11/16/2016    Past Medical History:  Diagnosis Date   Arthritis    Asthma    Depression    Fatty liver    Gallstones    GERD (gastroesophageal reflux disease)    Headache    History of low potassium    Hypertension    Hypothyroidism    Right arm numbness    Right leg numbness    Sciatic nerve pain    Thyroid disease    Urticaria     Past Surgical History:  Procedure Laterality Date   APPENDECTOMY     CARPAL TUNNEL RELEASE Right 08/07/2019   Procedure: RIGHT CARPAL TUNNEL RELEASE;  Surgeon: Wes Hamman, MD;  Location: [facility];  Service: Orthopedics;  Laterality: Right;   COLONOSCOPY     LAPAROSCOPIC RIGHT HEMI COLECTOMY N/A 11/26/2020   Procedure: LAPAROSCOPIC RIGHT HEMI COLECTOMY;  Surgeon: Melvenia Stabs, MD;  Location: [facility];  Service: General;  Laterality: N/A;   LAPAROSCOPIC VAGINAL HYSTERECTOMY WITH SALPINGECTOMY Bilateral 06/26/2017   Procedure: LAPAROSCOPIC ASSISTED VAGINAL HYSTERECTOMY WITH SALPINGECTOMY;  Surgeon: Lavoie, Marie-Lyne, MD;  Location: [facility];  Service: Gynecology;  Laterality: Bilateral;  request 7:30am OR time  requests 2 hours Rex the lighted retractor rep will be here    UPPER GI ENDOSCOPY      Social History   Socioeconomic History   Marital status: Divorced    Spouse name: Not on file   Number of children: 4   Years of education: Not on file   Highest education level: 12th grade  Occupational History   Not on file  Tobacco Use   Smoking status: Never    Passive exposure: Never   Smokeless tobacco: Never  Vaping Use   Vaping status: Never Used  Substance and Sexual  Activity   Alcohol use: No   Drug use: No   Sexual activity: Not Currently    Partners: Male    Birth control/protection: Surgical    Comment: first time sexual intercourse 53 years old, less than 5   Other Topics Concern   Not on file  Social History Narrative   Not on file   Social Drivers of Health   Financial Resource Strain: Low Risk  (01/06/2024)   Overall Financial Resource Strain (CARDIA)    Difficulty of Paying Living Expenses: Not very hard  Food Insecurity: No Food Insecurity (01/06/2024)   Hunger Vital Sign    Worried About Running Out of Food in the Last Year: Never true    Ran Out of Food in the Last Year: Never true  Transportation Needs: No Transportation Needs (01/06/2024)   PRAPARE -  Administrator, Civil Service (Medical): No    Lack of Transportation (Non-Medical): No  Physical Activity: Sufficiently Active (01/06/2024)   Exercise Vital Sign    Days of Exercise per Week: 5 days    Minutes of Exercise per Session: 30 min  Stress: No Stress Concern Present (01/06/2024)   [employer] of Occupational Health - Occupational Stress Questionnaire    Feeling of Stress : Not at all  Social Connections: Moderately Isolated (01/06/2024)   Social Connection and Isolation Panel [NHANES]    Frequency of Communication with Friends and Family: Three times a week    Frequency of Social Gatherings with Friends and Family: Once a week    Attends Religious Services: More than 4 times per year    Active Member of [employer] or Organizations: No    Attends Engineer, structural: Not on file    Marital Status: Divorced  Intimate Partner Violence: Unknown (11/25/2021)   Received from [employer], [facility]   HITS    Physically Hurt: Not on file    Insult or Talk Down To: Not on file    Threaten Physical Harm: Not on file    Scream or Curse: Not on file    Family History  Problem Relation Age of Onset   Asthma Mother    Hypertension Mother    Uterine  cancer Mother    Kidney failure Father    Lung disease Father    High blood pressure Father    Cancer Sister        unknown   Kidney failure Sister    Stomach cancer Maternal Aunt    Eczema Niece    Colon cancer Neg Hx    Esophageal cancer Neg Hx    Rectal cancer Neg Hx      Review of Systems  Constitutional:  Positive for malaise/fatigue.  HENT: Negative.  Negative for congestion and sore throat.   Respiratory: Negative.  Negative for cough and shortness of breath.   Cardiovascular:  Positive for palpitations.  Gastrointestinal:  Negative for abdominal pain, diarrhea, nausea and vomiting.  Genitourinary: Negative.  Negative for dysuria and hematuria.  Neurological:  Positive for dizziness.    Vitals:   01/08/24 1546  BP: 124/88  Pulse: 76  Temp: 98.6 F (37 C)  SpO2: 97%    Physical Exam Vitals reviewed.  Constitutional:      Appearance: Normal appearance.  HENT:     Head: Normocephalic.     Mouth/Throat:     Mouth: Mucous membranes are moist.     Pharynx: Oropharynx is clear.  Eyes:     Extraocular Movements: Extraocular movements intact.     Pupils: Pupils are equal, round, and reactive to light.  Cardiovascular:     Rate and Rhythm: Normal rate and regular rhythm.     Pulses: Normal pulses.     Heart sounds: Normal heart sounds.  Pulmonary:     Effort: Pulmonary effort is normal.     Breath sounds: Normal breath sounds.  Musculoskeletal:     Cervical back: No tenderness.  Lymphadenopathy:     Cervical: No cervical adenopathy.  Skin:    General: Skin is warm and dry.     Capillary Refill: Capillary refill takes less than 2 seconds.  Neurological:     General: No focal deficit present.     Mental Status: She is alert and oriented to person, place, and time.  Psychiatric:  Mood and Affect: Mood normal.        Behavior: Behavior normal.      ASSESSMENT & PLAN: A total of 45 minutes was spent with the patient and counseling/coordination of  care regarding preparing for this visit, review of most recent office visit notes, review of multiple chronic medical conditions and their management, review of all medications, review of most recent bloodwork results, review of health maintenance items, education on nutrition, prognosis, documentation, and need for follow up.  Problem List Items Addressed This Visit       Cardiovascular and Mediastinum   Hypertension   BP Readings from Last 3 Encounters:  01/08/24 124/88  12/15/23 (!) 148/86  12/14/23 (!) 152/101  Well-controlled hypertension Continue Hyzaar 100-12.5 mg daily         Endocrine   Hypothyroidism   Clinically euthyroid Continue levothyroxine  112 mcg daily        Nervous and Auditory   Non-restorative sleep   Contributing greatly to generalized symptoms Recommend sleep studies Has failed over-the-counter sleep aids Recommend Ambien  5 mg at bedtime Referred for sleep studies      Relevant Medications   zolpidem  (AMBIEN ) 5 MG tablet   Other Relevant Orders   Ambulatory referral to Sleep Studies     Other   Arthralgia of multiple sites   Rheumatism.  Was recently evaluated by rheumatologist No clinical findings for lupus Fibromyalgia likely diagnosis Pain management discussed Sleep apnea contributing      Palpitations   Recently finished long-term  cardiac monitoring with Zio patch.  Results not available yet.      Tiredness - Primary   Clinically stable.  No red flag signs or symptoms. Recent blood work results reviewed with patient Differential diagnosis reviewed with patient Multifactorial most likely Suspecting obstructive sleep apnea contributing greatly to symptoms Recommend sleep studies Recommend sleeping medication      Relevant Orders   Ambulatory referral to Sleep Studies   Daytime somnolence   Most likely secondary to sleep apnea Stress is a contributing factor Sleep studies referral placed today      Relevant Orders    Ambulatory referral to Sleep Studies   Suspected sleep apnea   Relevant Orders   Ambulatory referral to Sleep Studies   Other Visit Diagnoses       Need for vaccination       Relevant Orders   Zoster Recombinant (Shingrix  ) (Completed)      Patient Instructions  Mantenimiento de Radiographer, therapeutic en las mujeres Health Maintenance, Female Adoptar un estilo de vida saludable y recibir atencin preventiva son importantes para promover la salud y Counsellor. Consulte al mdico sobre: El esquema adecuado para hacerse pruebas y exmenes peridicos. Cosas que puede hacer por su cuenta para prevenir enfermedades y [place] sano. Qu debo saber sobre la dieta, el peso y el ejercicio? Consuma una dieta saludable  Consuma una dieta que incluya muchas verduras, frutas, productos lcteos con bajo contenido de grasa y protenas magras. No consuma muchos alimentos ricos en grasas slidas, azcares agregados o sodio. Mantenga un peso saludable El ndice de masa muscular [facility]) se [place] para identificar problemas de [place]. Proporciona una estimacin de la grasa corporal basndose en el peso y la altura. Su mdico puede ayudarle a determinar su [facility] y a Personnel officer o Pharmacologist un peso saludable. Haga ejercicio con regularidad Haga ejercicio con regularidad. Esta es una de las prcticas ms importantes que puede hacer por su salud. La mayora de los adultos deben  seguir estas pautas: Realizar, al menos, 150 minutos de actividad fsica por semana. El ejercicio debe aumentar la frecuencia cardaca y Media planner transpirar (ejercicio de intensidad moderada). Hacer ejercicios de fortalecimiento por lo [facility] por semana. Agregue esto a su plan de ejercicio de intensidad moderada. Pase menos tiempo sentada. Incluso la actividad fsica ligera puede ser beneficiosa. Controle sus niveles de colesterol y lpidos en la sangre Comience a realizarse anlisis de lpidos y Oncologist en la sangre a los 20 aos y luego  reptalos cada 5 aos. Hgase controlar los niveles de colesterol con mayor frecuencia si: Sus niveles de lpidos y colesterol son altos. Es mayor de 40 aos. Presenta un alto riesgo de padecer enfermedades cardacas. Qu debo saber sobre las pruebas de deteccin del cncer? Segn su historia clnica y sus antecedentes familiares, es posible que deba realizarse pruebas de deteccin del cncer en diferentes edades. Esto puede incluir pruebas de deteccin de lo siguiente: Cncer de mama. Cncer de cuello uterino. Cncer colorrectal. Cncer de piel. Cncer de pulmn. Qu debo saber sobre la enfermedad cardaca, la diabetes y la hipertensin arterial? Presin arterial y enfermedad cardaca La hipertensin arterial causa enfermedades cardacas y [place] el riesgo de accidente cerebrovascular. Es ms probable que esto se manifieste en las personas que tienen lecturas de presin arterial alta o tienen sobrepeso. Hgase controlar la presin arterial: Cada 3 a 5 aos si tiene entre 18 y 40 aos. Todos los aos si es mayor de 40 aos. Diabetes Realcese exmenes de deteccin de la diabetes con regularidad. Este anlisis revisa el nivel de azcar en la sangre en [place]. Hgase las pruebas de deteccin: Cada tres aos despus de los 40 aos de edad si tiene un peso normal y un bajo riesgo de padecer diabetes. Con ms frecuencia y a partir de [place] edad inferior si tiene sobrepeso o un alto riesgo de padecer diabetes. Qu debo saber sobre la prevencin de infecciones? Hepatitis B Si tiene un riesgo ms alto de contraer hepatitis B, debe someterse a un examen de deteccin de este virus. Hable con el mdico para averiguar si tiene riesgo de contraer la infeccin por hepatitis B. Hepatitis C Se recomienda el anlisis a: [employer] 1945 y 1965. Todas las personas que tengan un riesgo de haber contrado hepatitis C. Enfermedades de transmisin sexual (ETS) Hgase las pruebas de  Airline pilot de ITS, incluidas la gonorrea y la clamidia, si: Es sexualmente activa y es menor de [address]. Es mayor de [address], y Public affairs consultant informa que corre riesgo de tener este tipo de infecciones. La actividad sexual ha cambiado desde que le hicieron la ltima prueba de deteccin y tiene un riesgo mayor de tener clamidia o Copy. Pregntele al mdico si usted tiene riesgo. Pregntele al mdico si usted tiene un alto riesgo de Primary school teacher VIH. El mdico tambin puede recomendarle un medicamento recetado para ayudar a evitar la infeccin por el VIH. Si elige tomar medicamentos para prevenir el VIH, primero debe [employer] de deteccin del VIH. Luego debe hacerse anlisis cada 3 meses mientras est tomando los medicamentos. Embarazo Si est por dejar de menstruar (fase premenopusica) y usted puede quedar embarazada, busque asesoramiento antes de quedar embarazada. Tome de 400 a 800 microgramos (mcg) de cido flico todos los das si queda embarazada. Pida mtodos de control de la natalidad (anticonceptivos) si desea evitar un embarazo no deseado. Osteoporosis y [place] La osteoporosis es una enfermedad en la que los huesos pierden los  minerales y la fuerza por el avance de la edad. El resultado pueden ser fracturas en los [place]. Si tiene 65 aos o ms, o si est en riesgo de sufrir osteoporosis y fracturas, pregunte a su mdico si debe: Hacerse pruebas de deteccin de prdida sea. Tomar un suplemento de calcio o de vitamina D para reducir el riesgo de fracturas. Recibir terapia de reemplazo hormonal (TRH) para tratar los sntomas de la menopausia. Siga estas indicaciones en su casa: Consumo de alcohol No beba alcohol si: Su mdico le indica no hacerlo. Est embarazada, puede estar embarazada o est tratando de quedar embarazada. Si bebe alcohol: Limite la cantidad que bebe a lo siguiente: De 0 a 1 bebida por da. Sepa cunta cantidad de alcohol hay en las bebidas que toma. En  los [address], una medida equivale a una botella de cerveza de 12 oz (355 ml), un vaso de vino de 5 oz (148 ml) o un vaso de una bebida alcohlica de alta graduacin de 1 oz (44 ml). Estilo de vida No consuma ningn producto que contenga nicotina o tabaco. Estos productos incluyen cigarrillos, tabaco para Theatre manager y aparatos de vapeo, como los cigarrillos electrnicos. Si necesita ayuda para dejar de consumir estos productos, consulte al mdico. No consuma drogas. No comparta agujas. Solicite ayuda a su mdico si necesita apoyo o informacin para abandonar las drogas. Indicaciones generales Realcese los estudios de rutina de [address], dentales y de Wellsite geologist. Mantngase al da con las vacunas. Infrmele a su mdico si: Se siente deprimida con frecuencia. Alguna vez ha sido vctima de maltrato o no se siente seguro en su casa. Resumen Adoptar un estilo de vida saludable y recibir atencin preventiva son importantes para promover la salud y Counsellor. Siga las instrucciones del mdico acerca de una dieta saludable, el ejercicio y la realizacin de pruebas o exmenes para Hotel manager. Siga las instrucciones del mdico con respecto al control del colesterol y la presin arterial. Esta informacin no tiene Theme park manager el consejo del mdico. Asegrese de hacerle al mdico cualquier pregunta que tenga. Document Revised: 01/14/2021 Document Reviewed: 01/14/2021 Elsevier Patient Education  2024 Elsevier Inc.       Maryagnes Small, MD  Primary Care at [facility]

## 2024-01-08 NOTE — Patient Instructions (Signed)
 Mantenimiento de Radiographer, therapeutic en las mujeres Health Maintenance, Female Adoptar un estilo de vida saludable y recibir atencin preventiva son importantes para promover la salud y Counsellor. Consulte al mdico sobre: El esquema adecuado para hacerse pruebas y exmenes peridicos. Cosas que puede hacer por su cuenta para prevenir enfermedades y [place]he sano. Qu debo saber sobre la dieta, el peso y el ejercicio? Consuma una dieta saludable  Consuma una dieta que incluya muchas verduras, frutas, productos lcteos con bajo contenido de [place] y Associate Professor. No consuma muchos alimentos ricos en grasas slidas, azcares agregados o sodio. Mantenga un peso saludable El ndice de masa muscular [facility]) se [place] para identificar problemas de [place]. Proporciona una estimacin de la grasa corporal basndose en el peso y la altura. Su mdico puede ayudarle a Engineer, site IMC y a Personnel officer o Pharmacologist un peso saludable. Haga ejercicio con regularidad Haga ejercicio con regularidad. Esta es una de las prcticas ms importantes que puede hacer por su salud. La [employer] de los adultos deben seguir estas pautas: Education officer, environmental, al menos, 150 minutos de actividad fsica por semana. El ejercicio debe aumentar la frecuencia cardaca y Media planner transpirar (ejercicio de intensidad moderada). Hacer ejercicios de fortalecimiento por lo [employer] por semana. Agregue esto a su plan de ejercicio de intensidad moderada. Pase menos tiempo sentada. Incluso la actividad fsica ligera puede ser beneficiosa. Controle sus niveles de colesterol y lpidos en la sangre Comience a realizarse anlisis de lpidos y Oncologist en la sangre a los 20 aos y luego reptalos cada 5 aos. Hgase controlar los niveles de colesterol con mayor frecuencia si: Sus niveles de lpidos y colesterol son altos. Es mayor de 40 aos. Presenta un alto riesgo de padecer enfermedades cardacas. Qu debo saber sobre las pruebas de deteccin del  cncer? Segn su historia clnica y sus antecedentes familiares, es posible que deba realizarse pruebas de deteccin del cncer en diferentes edades. Esto puede incluir pruebas de deteccin de lo siguiente: Cncer de mama. Cncer de cuello uterino. Cncer colorrectal. Cncer de piel. Cncer de pulmn. Qu debo saber sobre la enfermedad cardaca, la diabetes y la hipertensin arterial? Presin arterial y enfermedad cardaca La hipertensin arterial causa enfermedades cardacas y [place] el riesgo de accidente cerebrovascular. Es ms probable que esto se manifieste en las personas que tienen lecturas de presin arterial alta o tienen sobrepeso. Hgase controlar la presin arterial: Cada 3 a 5 aos si tiene entre 18 y 50 aos. Todos los aos si es mayor de 40 aos. Diabetes Realcese exmenes de deteccin de la diabetes con regularidad. Este anlisis revisa el nivel de azcar en la sangre en [place]. Hgase las pruebas de deteccin: Cada tres aos despus de los 40 aos de edad si tiene un peso normal y un bajo riesgo de padecer diabetes. Con ms frecuencia y a partir de [place] edad inferior si tiene sobrepeso o un alto riesgo de padecer diabetes. Qu debo saber sobre la prevencin de infecciones? Hepatitis B Si tiene un riesgo ms alto de contraer hepatitis B, debe someterse a un examen de deteccin de este virus. Hable con el mdico para averiguar si tiene riesgo de contraer la infeccin por hepatitis B. Hepatitis C Se recomienda el anlisis a: [employer] 1943 y 1965. Todas las personas que tengan un riesgo de haber contrado hepatitis C. Enfermedades de transmisin sexual (ETS) Hgase las pruebas de Airline pilot de ITS, incluidas la gonorrea y la clamidia, si: Es sexualmente activa y es Adult nurse de 24  aos. Es mayor de 24 aos, y el mdico le informa que corre riesgo de tener este tipo de infecciones. La actividad sexual ha cambiado desde que le hicieron la ltima prueba de  deteccin y tiene un riesgo mayor de Warehouse manager clamidia o Copy. Pregntele al mdico si usted tiene riesgo. Pregntele al mdico si usted tiene un alto riesgo de Primary school teacher VIH. El mdico tambin puede recomendarle un medicamento recetado para ayudar a evitar la infeccin por el VIH. Si elige tomar medicamentos para prevenir el VIH, primero debe [employer] de deteccin del VIH. Luego debe hacerse anlisis cada 3 meses mientras est tomando los medicamentos. Embarazo Si est por dejar de Armed forces training and education officer (fase premenopusica) y usted puede quedar [place], busque asesoramiento antes de [place]. Tome de 400 a 800 microgramos (mcg) de cido [employer] si [place]. Pida mtodos de control de la natalidad (anticonceptivos) si desea evitar un embarazo no deseado. Osteoporosis y [place] La osteoporosis es una enfermedad en la que los huesos pierden los minerales y la fuerza por el avance de la edad. El resultado pueden ser fracturas en los [place]. Si tiene 65 aos o ms, o si est en riesgo de sufrir osteoporosis y fracturas, pregunte a su mdico si debe: Hacerse pruebas de deteccin de prdida sea. Tomar un suplemento de calcio o de vitamina D para reducir el riesgo de fracturas. Recibir terapia de reemplazo hormonal (TRH) para tratar los sntomas de la menopausia. Siga estas indicaciones en su casa: Consumo de alcohol No beba alcohol si: Su mdico le indica no hacerlo. Est embarazada, puede estar embarazada o est tratando de [place]. Si bebe alcohol: Limite la cantidad que bebe a lo siguiente: De 0 a 1 bebida por da. Sepa cunta cantidad de alcohol hay en las bebidas que toma. En los [address], una medida equivale a una botella de cerveza de 12 oz (355 ml), un vaso de vino de 5 oz (148 ml) o un vaso de una bebida alcohlica de alta graduacin de 1 oz (44 ml). Estilo de vida No consuma ningn producto que contenga nicotina o tabaco. Estos  productos incluyen cigarrillos, tabaco para Theatre manager y aparatos de vapeo, como los Administrator, Civil Service. Si necesita ayuda para dejar de consumir estos productos, consulte al mdico. No consuma drogas. No comparta agujas. Solicite ayuda a su mdico si necesita apoyo o informacin para abandonar las drogas. Indicaciones generales Realcese los estudios de rutina de [address], dentales y de Wellsite geologist. Mantngase al da con las vacunas. Infrmele a su mdico si: Se siente deprimida con frecuencia. Alguna vez ha sido vctima de [place] o no se siente seguro en su casa. Resumen Adoptar un estilo de vida saludable y recibir atencin preventiva son importantes para promover la salud y Counsellor. Siga las instrucciones del mdico acerca de una dieta saludable, el ejercicio y la realizacin de pruebas o exmenes para Hotel manager. Siga las instrucciones del mdico con respecto al control del colesterol y la presin arterial. Esta informacin no tiene Theme park manager el consejo del mdico. Asegrese de hacerle al mdico cualquier pregunta que tenga. Document Revised: 01/14/2021 Document Reviewed: 01/14/2021 Elsevier Patient Education  2024 [employer].

## 2024-01-08 NOTE — Assessment & Plan Note (Signed)
 Recently finished long-term  cardiac monitoring with Zio patch.  Results not available yet.

## 2024-01-08 NOTE — Assessment & Plan Note (Signed)
 Contributing greatly to generalized symptoms Recommend sleep studies Has failed over-the-counter sleep aids Recommend Ambien  5 mg at bedtime Referred for sleep studies

## 2024-01-08 NOTE — Assessment & Plan Note (Signed)
 Most likely secondary to sleep apnea Stress is a contributing factor Sleep studies referral placed today

## 2024-01-08 NOTE — Assessment & Plan Note (Signed)
 BP Readings from Last 3 Encounters:  01/08/24 124/88  12/15/23 (!) 148/86  12/14/23 (!) 152/101  Well-controlled hypertension Continue Hyzaar 100-12.5 mg daily

## 2024-01-15 DIAGNOSIS — R002 Palpitations: Secondary | ICD-10-CM

## 2024-01-23 ENCOUNTER — Encounter: Payer: Self-pay | Admitting: Hematology

## 2024-01-23 ENCOUNTER — Inpatient Hospital Stay: Payer: PRIVATE HEALTH INSURANCE

## 2024-01-23 ENCOUNTER — Inpatient Hospital Stay: Payer: PRIVATE HEALTH INSURANCE | Attending: Hematology | Admitting: Hematology

## 2024-01-23 ENCOUNTER — Other Ambulatory Visit: Payer: Self-pay

## 2024-01-23 VITALS — BP 136/82 | HR 85 | Temp 97.9°F | Resp 16 | Ht 62.5 in | Wt 153.9 lb

## 2024-01-23 DIAGNOSIS — R002 Palpitations: Secondary | ICD-10-CM | POA: Diagnosis not present

## 2024-01-23 DIAGNOSIS — D472 Monoclonal gammopathy: Secondary | ICD-10-CM | POA: Diagnosis not present

## 2024-01-23 DIAGNOSIS — K8020 Calculus of gallbladder without cholecystitis without obstruction: Secondary | ICD-10-CM | POA: Diagnosis not present

## 2024-01-23 DIAGNOSIS — E079 Disorder of thyroid, unspecified: Secondary | ICD-10-CM | POA: Insufficient documentation

## 2024-01-23 DIAGNOSIS — R42 Dizziness and giddiness: Secondary | ICD-10-CM | POA: Diagnosis not present

## 2024-01-23 DIAGNOSIS — Z8049 Family history of malignant neoplasm of other genital organs: Secondary | ICD-10-CM | POA: Diagnosis not present

## 2024-01-23 DIAGNOSIS — I10 Essential (primary) hypertension: Secondary | ICD-10-CM | POA: Insufficient documentation

## 2024-01-23 DIAGNOSIS — F32A Depression, unspecified: Secondary | ICD-10-CM | POA: Insufficient documentation

## 2024-01-23 DIAGNOSIS — R5383 Other fatigue: Secondary | ICD-10-CM | POA: Insufficient documentation

## 2024-01-23 DIAGNOSIS — D892 Hypergammaglobulinemia, unspecified: Secondary | ICD-10-CM | POA: Insufficient documentation

## 2024-01-23 DIAGNOSIS — Z800 Family history of malignant neoplasm of digestive organs: Secondary | ICD-10-CM | POA: Diagnosis not present

## 2024-01-23 LAB — CBC WITH DIFFERENTIAL (CANCER CENTER ONLY)
Abs Immature Granulocytes: 0.02 10*3/uL (ref 0.00–0.07)
Basophils Absolute: 0.1 10*3/uL (ref 0.0–0.1)
Basophils Relative: 1 %
Eosinophils Absolute: 0.1 10*3/uL (ref 0.0–0.5)
Eosinophils Relative: 2 %
HCT: 35.2 % — ABNORMAL LOW (ref 36.0–46.0)
Hemoglobin: 12 g/dL (ref 12.0–15.0)
Immature Granulocytes: 0 %
Lymphocytes Relative: 37 %
Lymphs Abs: 3 10*3/uL (ref 0.7–4.0)
MCH: 28.7 pg (ref 26.0–34.0)
MCHC: 34.1 g/dL (ref 30.0–36.0)
MCV: 84.2 fL (ref 80.0–100.0)
Monocytes Absolute: 0.9 10*3/uL (ref 0.1–1.0)
Monocytes Relative: 11 %
Neutro Abs: 4 10*3/uL (ref 1.7–7.7)
Neutrophils Relative %: 49 %
Platelet Count: 351 10*3/uL (ref 150–400)
RBC: 4.18 MIL/uL (ref 3.87–5.11)
RDW: 14.6 % (ref 11.5–15.5)
WBC Count: 8.1 10*3/uL (ref 4.0–10.5)
nRBC: 0 % (ref 0.0–0.2)

## 2024-01-23 LAB — CMP (CANCER CENTER ONLY)
ALT: 27 U/L (ref 0–44)
AST: 28 U/L (ref 15–41)
Albumin: 4.3 g/dL (ref 3.5–5.0)
Alkaline Phosphatase: 85 U/L (ref 38–126)
Anion gap: 7 (ref 5–15)
BUN: 9 mg/dL (ref 6–20)
CO2: 31 mmol/L (ref 22–32)
Calcium: 9 mg/dL (ref 8.9–10.3)
Chloride: 105 mmol/L (ref 98–111)
Creatinine: 0.7 mg/dL (ref 0.44–1.00)
GFR, Estimated: >60 mL/min (ref >60)
Glucose, Bld: 101 mg/dL — ABNORMAL HIGH (ref 70–99)
Potassium: 2.8 mmol/L — ABNORMAL LOW (ref 3.5–5.1)
Sodium: 143 mmol/L (ref 135–145)
Total Bilirubin: 0.2 mg/dL (ref 0.0–1.2)
Total Protein: 7.7 g/dL (ref 6.5–8.1)

## 2024-01-23 NOTE — Progress Notes (Signed)
 [facility]   [phone] [phone]   Clinic New Consult Note   Patient Care Team: Elvira Hammersmith, MD as PCP - General (Internal Medicine) Tilton Fontan, RN as Registered Nurse 01/23/2024  CHIEF COMPLAINTS/PURPOSE OF CONSULTATION:  MGUS  REFERRING PHYSICIAN: Dr. Nicholas Bari   Discussed the use of AI scribe software for clinical note transcription with the patient, who gave verbal consent to proceed.  History of Present Illness Julia Green is a 53 year old female who presents with abnormal protein in blood. She was referred by her rheumatologist Dr. Alvira Josephs.  She experiences significant dizziness and occasional heart palpitations, described as a 'running heart,' with frequent episodes of dizziness occurring when standing or waking at night. She also experiences fatigue and body aches but can perform normal activities.  Screening test was positive for ANA.  So she was referred to rheumatologist Dr. Alvira Josephs, for performed lab tests, including SPEP with immunofixation, which was positive for monoclonal IgG kappa protein.  Per CBC and CMP from October 2024 were unremarkable.  Her current medications include omeprazole , estradiol , an albuterol  inhaler as needed, baby aspirin, and vitamins. She has discontinued Zyrtec, Cymbalta , Singulair , and prednisone .  Family history includes her mother possibly having uterine cancer and a maternal aunt who possibly died from lung cancer.     MEDICAL HISTORY:  Past Medical History:  Diagnosis Date   Arthritis    Asthma    Depression    Fatty liver    Gallstones    GERD (gastroesophageal reflux disease)    Headache    History of low potassium    Hypertension    Hypothyroidism    Right arm numbness    Right leg numbness    Sciatic nerve pain    Thyroid disease    Urticaria     SURGICAL HISTORY: Past Surgical History:  Procedure Laterality Date   ABDOMINAL HYSTERECTOMY      APPENDECTOMY     CARPAL TUNNEL RELEASE Right 08/07/2019   Procedure: RIGHT CARPAL TUNNEL RELEASE;  Surgeon: Wes Hamman, MD;  Location: [facility];  Service: Orthopedics;  Laterality: Right;   COLONOSCOPY     LAPAROSCOPIC RIGHT HEMI COLECTOMY N/A 11/26/2020   Procedure: LAPAROSCOPIC RIGHT HEMI COLECTOMY;  Surgeon: Melvenia Stabs, MD;  Location: [facility];  Service: General;  Laterality: N/A;   LAPAROSCOPIC VAGINAL HYSTERECTOMY WITH SALPINGECTOMY Bilateral 06/26/2017   Procedure: LAPAROSCOPIC ASSISTED VAGINAL HYSTERECTOMY WITH SALPINGECTOMY;  Surgeon: Lavoie, Marie-Lyne, MD;  Location: [facility];  Service: Gynecology;  Laterality: Bilateral;  request 7:30am OR time  requests 2 hours Rex the lighted retractor rep will be here    UPPER GI ENDOSCOPY     2 SOCIAL HISTORY: Social History   Socioeconomic History   Marital status: Divorced    Spouse name: Not on file   Number of children: 4   Years of education: Not on file   Highest education level: 12th grade  Occupational History   Not on file  Tobacco Use   Smoking status: Never    Passive exposure: Never   Smokeless tobacco: Never  Vaping Use   Vaping status: Never Used  Substance and Sexual Activity   Alcohol use: No   Drug use: No   Sexual activity: Not Currently    Partners: Male    Birth control/protection: Surgical    Comment: first time sexual intercourse 53 years old, less than 5   Other Topics Concern  Not on file  Social History Narrative   Not on file   Social Drivers of Health   Financial Resource Strain: Low Risk  (01/06/2024)   Overall Financial Resource Strain (CARDIA)    Difficulty of Paying Living Expenses: Not very hard  Food Insecurity: Food Insecurity Present (01/23/2024)   Hunger Vital Sign    Worried About Running Out of Food in the Last Year: Sometimes true    Ran Out of Food in the Last Year: Sometimes true  Transportation Needs: No Transportation Needs (01/23/2024)   PRAPARE -  Administrator, Civil Service (Medical): No    Lack of Transportation (Non-Medical): No  Physical Activity: Sufficiently Active (01/06/2024)   Exercise Vital Sign    Days of Exercise per Week: 5 days    Minutes of Exercise per Session: 30 min  Stress: No Stress Concern Present (01/06/2024)   [employer] of Occupational Health - Occupational Stress Questionnaire    Feeling of Stress : Not at all  Social Connections: Moderately Isolated (01/06/2024)   Social Connection and Isolation Panel [NHANES]    Frequency of Communication with Friends and Family: Three times a week    Frequency of Social Gatherings with Friends and Family: Once a week    Attends Religious Services: More than 4 times per year    Active Member of [employer] or Organizations: No    Attends Engineer, structural: Not on file    Marital Status: Divorced  Intimate Partner Violence: Not At Risk (01/23/2024)   Humiliation, Afraid, Rape, and Kick questionnaire    Fear of Current or Ex-Partner: No    Emotionally Abused: No    Physically Abused: No    Sexually Abused: No    FAMILY HISTORY: Family History  Problem Relation Age of Onset   Asthma Mother    Hypertension Mother    Uterine cancer Mother    Kidney failure Father    Lung disease Father    High blood pressure Father    Cancer Sister        unknown   Kidney failure Sister    Stomach cancer Maternal Aunt    Eczema Niece    Colon cancer Neg Hx    Esophageal cancer Neg Hx    Rectal cancer Neg Hx     ALLERGIES:  has no known allergies.  MEDICATIONS:  Current Outpatient Medications  Medication Sig Dispense Refill   albuterol  (ACCUNEB ) 1.25 MG/3ML nebulizer solution Take by nebulization every 6 (six) hours as needed.     albuterol  (VENTOLIN  HFA) 108 (90 Base) MCG/ACT inhaler Inhale 2 puffs into the lungs every 6 (six) hours as needed for wheezing or shortness of breath. 8 g 2   aspirin EC 81 MG tablet Take 81 mg by mouth daily.      azelastine  (ASTELIN ) 0.1 % nasal spray Place 2 sprays into both nostrils 2 (two) times daily. Use in each nostril as directed 30 mL 5   budesonide  (PULMICORT ) 0.25 MG/2ML nebulizer solution Take 2 mLs (0.25 mg total) by nebulization in the morning and at bedtime. 120 mL 3   calcium carbonate (OSCAL) 1500 (600 Ca) MG TABS tablet Take 1,500 mg by mouth daily.     cetirizine-pseudoephedrine (ZYRTEC-D) 5-120 MG tablet Take 1 tablet by mouth every 12 (twelve) hours.     estradiol  (ESTRACE ) 1 MG tablet Take 1 tablet (1 mg total) by mouth daily. 90 tablet 4   fluticasone  (FLONASE ) 50 MCG/ACT nasal spray Place 2  sprays into both nostrils daily. 16 g 3   levocetirizine (XYZAL ) 5 MG tablet Take 1 tablet (5 mg total) by mouth every evening. 20 tablet 1   levothyroxine  (SYNTHROID ) 112 MCG tablet TAKE 1 TABLET BY MOUTH EVERY MORNING*RETURN TO CLINIC IN 6 WEEKS FOR TSH RECHECK 90 tablet 3   losartan -hydrochlorothiazide  (HYZAAR) 100-12.5 MG tablet Take 1 tablet by mouth daily. 90 tablet 3   montelukast  (SINGULAIR ) 10 MG tablet Take 1 tablet (10 mg total) by mouth at bedtime. 90 tablet 3   Omega-3 Fatty Acids (FISH OIL PO) Take by mouth.     omeprazole  (PRILOSEC) 40 MG capsule Take 1 capsule (40 mg total) by mouth daily. 90 capsule 1   vitamin C (ASCORBIC ACID) 500 MG tablet Take 500 mg by mouth daily.     zolpidem  (AMBIEN ) 5 MG tablet Take 1 tablet (5 mg total) by mouth at bedtime as needed for sleep. 15 tablet 1   No current facility-administered medications for this visit.    REVIEW OF SYSTEMS:   Constitutional: Denies fevers, chills or abnormal night sweats Eyes: Denies blurriness of vision, double vision or watery eyes Ears, nose, mouth, throat, and face: Denies mucositis or sore throat Respiratory: Denies cough, dyspnea or wheezes Cardiovascular: Denies palpitation, chest discomfort or lower extremity swelling Gastrointestinal:  Denies nausea, heartburn or change in bowel habits Skin: Denies abnormal  skin rashes Lymphatics: Denies new lymphadenopathy or easy bruising Neurological:Denies numbness, tingling or new weaknesses Behavioral/Psych: Mood is stable, no new changes  All other systems were reviewed with the patient and are negative.  PHYSICAL EXAMINATION: ECOG PERFORMANCE STATUS: 1 - Symptomatic but completely ambulatory  Vitals:   01/23/24 1441  BP: 136/82  Pulse: 85  Resp: 16  Temp: 97.9 F (36.6 C)  SpO2: 95%   Filed Weights   01/23/24 1441  Weight: 153 lb 14.4 oz (69.8 kg)    GENERAL:alert, no distress and comfortable SKIN: skin color, texture, turgor are normal, no rashes or significant lesions EYES: normal, conjunctiva are pink and non-injected, sclera clear OROPHARYNX:no exudate, no erythema and lips, buccal mucosa, and tongue normal  NECK: supple, thyroid normal size, non-tender, without nodularity LYMPH:  no palpable lymphadenopathy in the cervical, axillary or inguinal LUNGS: clear to auscultation and percussion with normal breathing effort HEART: regular rate & rhythm and no murmurs and no lower extremity edema ABDOMEN:abdomen soft, non-tender and normal bowel sounds Musculoskeletal:no cyanosis of digits and no clubbing  PSYCH: alert & oriented x 3 with fluent speech NEURO: no focal motor/sensory deficits  Physical Exam    LABORATORY DATA:  I have reviewed the data as listed    Latest Ref Rng & Units 01/23/2024    3:30 PM 05/25/2023    4:21 PM 03/09/2023    4:16 PM  CBC  WBC 4.0 - 10.5 K/uL 8.1  9.5  11.3   Hemoglobin 12.0 - 15.0 g/dL 52.8  41.3  24.4   Hematocrit 36.0 - 46.0 % 35.2  37.0  39.1   Platelets 150 - 400 K/uL 351  329.0  295.0     @cmpl @  RADIOGRAPHIC STUDIES: I have personally reviewed the radiological images as listed and agreed with the findings in the report. LONG TERM MONITOR (3-14 DAYS) Result Date: 01/15/2024 NSR with sinus brady (53/min) and sinus tachy (156/min), ave 89/min. No VT, SVT, or atrial fib.  No prolonged pauses  Symptoms associated with palpitations, lightheadedness and fainting associated with NSR or sinus tachycardia. Less than 1% PVC's and PAC's.  Patch Wear Time:  13 days and 23 hours (2025-04-17T21:44:35-0400 to 2025-05-01T21:44:27-0400)    Assessment & Plan MGUS (Monoclonal Gammopathy of Undetermined Significance) MGUS is suspected based on abnormal protein electrophoresis and IFE. She exhibits no symptoms indicative of multiple myeloma, such as anemia, thrombocytopenia, renal impairment, or hypercalcemia. Previous year's blood tests were normal. MGUS is benign but carries a risk of progression to multiple myeloma, a hematologic malignancy. Current suspicion for multiple myeloma is low. - Order repeat blood tests - Collect 24-hour urine sample for protein analysis - Order bone survey X-ray at [facility] - Consider bone marrow biopsy if future test results indicate necessity  Hypertension Hypertension is documented.  Thyroid disorder Thyroid disorder is managed with medication.  Gallbladder stones Asymptomatic cholelithiasis. No immediate intervention required.  Depression Intermittent depression reported. Not currently on Cymbalta . No acute concerns or suicidal ideation reported.  Plan - I reviewed her previous lab work, including SPEP and immunofixation - Will repeat a CBC, CMP, multiple myeloma panel, serum light chain levels, and 24-hour urine electrophoresis with light chain quantification -Bone survey in the next few weeks - Phone visit in 3 weeks to review the above test results and decide if she needs a bone marrow biopsy.   Orders Placed This Encounter  Procedures   DG Bone Survey Met    Standing Status:   Future    Expected Date:   01/30/2024    Expiration Date:   01/22/2025    Reason for Exam (SYMPTOM  OR DIAGNOSIS REQUIRED):   rule out bone lesion    Is patient pregnant?:   No    Preferred imaging location?:   [facility]   Kappa/lambda light chains    Standing  Status:   Future    Number of Occurrences:   1    Expiration Date:   01/22/2025   Multiple Myeloma Panel (SPEP&IFE w/QIG)    Standing Status:   Future    Number of Occurrences:   1    Expiration Date:   01/22/2025   24-Hr Ur UPEP/UIFE/Light Chains/TP    Standing Status:   Future    Expiration Date:   01/22/2025   CBC with Differential (Cancer Center Only)    Standing Status:   Future    Number of Occurrences:   1    Expiration Date:   01/22/2025   CMP (Cancer Center only)    Standing Status:   Future    Number of Occurrences:   1    Expiration Date:   01/22/2025    All questions were answered. The patient knows to call the clinic with any problems, questions or concerns. I spent 30 minutes counseling the patient face to face. The total time spent in the appointment was 35 minutes including review of chart and various tests results, discussions about plan of care and coordination of care plan.     Sonja [place], MD 01/23/2024

## 2024-01-24 LAB — KAPPA/LAMBDA LIGHT CHAINS
Kappa free light chain: 29.9 mg/L — ABNORMAL HIGH (ref 3.3–19.4)
Kappa, lambda light chain ratio: 1.95 — ABNORMAL HIGH (ref 0.26–1.65)
Lambda free light chains: 15.3 mg/L (ref 5.7–26.3)

## 2024-01-25 LAB — MULTIPLE MYELOMA PANEL, SERUM
Albumin SerPl Elph-Mcnc: 3.5 g/dL (ref 2.9–4.4)
Albumin/Glob SerPl: 1.1 (ref 0.7–1.7)
Alpha 1: 0.2 g/dL (ref 0.0–0.4)
Alpha2 Glob SerPl Elph-Mcnc: 0.7 g/dL (ref 0.4–1.0)
B-Globulin SerPl Elph-Mcnc: 1 g/dL (ref 0.7–1.3)
Gamma Glob SerPl Elph-Mcnc: 1.4 g/dL (ref 0.4–1.8)
Globulin, Total: 3.3 g/dL (ref 2.2–3.9)
IgA: 231 mg/dL (ref 87–352)
IgG (Immunoglobin G), Serum: 1470 mg/dL (ref 586–1602)
IgM (Immunoglobulin M), Srm: 44 mg/dL (ref 26–217)
M Protein SerPl Elph-Mcnc: 0.7 g/dL — ABNORMAL HIGH
Total Protein ELP: 6.8 g/dL (ref 6.0–8.5)

## 2024-01-26 ENCOUNTER — Ambulatory Visit: Payer: Self-pay | Admitting: Hematology

## 2024-01-26 ENCOUNTER — Other Ambulatory Visit: Payer: Self-pay

## 2024-01-26 ENCOUNTER — Emergency Department (HOSPITAL_COMMUNITY)
Admission: EM | Admit: 2024-01-26 | Discharge: 2024-01-27 | Discharge status: Against Medical Advice | Payer: PRIVATE HEALTH INSURANCE | Attending: Emergency Medicine | Admitting: Emergency Medicine

## 2024-01-26 ENCOUNTER — Other Ambulatory Visit: Payer: Self-pay | Admitting: Hematology

## 2024-01-26 ENCOUNTER — Encounter (HOSPITAL_COMMUNITY): Payer: Self-pay

## 2024-01-26 DIAGNOSIS — R319 Hematuria, unspecified: Secondary | ICD-10-CM | POA: Insufficient documentation

## 2024-01-26 DIAGNOSIS — R300 Dysuria: Secondary | ICD-10-CM | POA: Insufficient documentation

## 2024-01-26 DIAGNOSIS — Z5321 Procedure and treatment not carried out due to patient leaving prior to being seen by health care provider: Secondary | ICD-10-CM | POA: Insufficient documentation

## 2024-01-26 MED ORDER — POTASSIUM CHLORIDE CRYS ER 20 MEQ PO TBCR: 20.0000 meq | EXTENDED_RELEASE_TABLET | Freq: Two times a day (BID) | ORAL | 0 refills | Status: DC

## 2024-01-26 NOTE — Telephone Encounter (Addendum)
 Called patient to relay message below as per Dr Maryalice Smaller, patient voiced full understanding and had no further questions at this time.    ----- Message from Sonja [place] sent at 01/26/2024 12:04 PM EDT ----- Please let her know K is low, probably related to her hydrochlorothiazide . I recommend oral KCL 20MEQ bid for a week then once a day for 1 more week, I will call in for her, increase high K diet,f/u with her PCP. Thanks   Sonja [place]

## 2024-01-26 NOTE — ED Triage Notes (Signed)
 Patient has had blood in urine and stool since Wednesday. Burns with urination. Denies blood thinners.

## 2024-01-28 DIAGNOSIS — D472 Monoclonal gammopathy: Secondary | ICD-10-CM | POA: Diagnosis not present

## 2024-01-29 ENCOUNTER — Ambulatory Visit (HOSPITAL_COMMUNITY)
Admission: RE | Admit: 2024-01-29 | Discharge: 2024-01-29 | Disposition: A | Discharge status: Home / Self Care | Payer: PRIVATE HEALTH INSURANCE | Source: Ambulatory Visit | Attending: Hematology | Admitting: Hematology

## 2024-01-29 DIAGNOSIS — D472 Monoclonal gammopathy: Secondary | ICD-10-CM | POA: Diagnosis not present

## 2024-02-01 ENCOUNTER — Other Ambulatory Visit: Payer: Self-pay | Admitting: Family Medicine

## 2024-02-01 DIAGNOSIS — R911 Solitary pulmonary nodule: Secondary | ICD-10-CM

## 2024-02-01 LAB — UPEP/UIFE/LIGHT CHAINS/TP, 24-HR UR
% BETA, Urine: 18 %
ALPHA 1 URINE: 2.4 %
Albumin, U: 50.4 %
Alpha 2, Urine: 11.7 %
Free Kappa Lt Chains,Ur: 8.24 mg/L (ref 1.17–86.46)
Free Kappa/Lambda Ratio: >11.94 (ref 1.83–14.26)
Free Lambda Lt Chains,Ur: <0.69 mg/L (ref 0.27–15.21)
GAMMA GLOBULIN URINE: 17.4 %
M-SPIKE %, Urine: 8.3 % — ABNORMAL HIGH
M-Spike, Mg/24 Hr: 11 mg/(24.h) — ABNORMAL HIGH
Total Protein, Urine-Ur/day: 129 mg/(24.h) (ref 30–150)
Total Protein, Urine: 4.3 mg/dL
Total Volume: 3000

## 2024-02-02 ENCOUNTER — Other Ambulatory Visit: Payer: Self-pay | Admitting: Family Medicine

## 2024-02-02 DIAGNOSIS — R911 Solitary pulmonary nodule: Secondary | ICD-10-CM

## 2024-02-04 ENCOUNTER — Encounter (HOSPITAL_BASED_OUTPATIENT_CLINIC_OR_DEPARTMENT_OTHER): Payer: Self-pay

## 2024-02-04 ENCOUNTER — Ambulatory Visit (HOSPITAL_BASED_OUTPATIENT_CLINIC_OR_DEPARTMENT_OTHER)
Admission: RE | Admit: 2024-02-04 | Discharge: 2024-02-04 | Disposition: A | Discharge status: Home / Self Care | Payer: PRIVATE HEALTH INSURANCE | Source: Ambulatory Visit | Attending: Family Medicine | Admitting: Family Medicine

## 2024-02-04 DIAGNOSIS — R911 Solitary pulmonary nodule: Secondary | ICD-10-CM

## 2024-02-05 ENCOUNTER — Other Ambulatory Visit (HOSPITAL_COMMUNITY): Payer: Self-pay | Admitting: Family Medicine

## 2024-02-05 DIAGNOSIS — R911 Solitary pulmonary nodule: Secondary | ICD-10-CM

## 2024-02-09 ENCOUNTER — Other Ambulatory Visit: Payer: PRIVATE HEALTH INSURANCE

## 2024-02-13 ENCOUNTER — Encounter: Payer: Self-pay | Admitting: Hematology

## 2024-02-13 ENCOUNTER — Inpatient Hospital Stay (HOSPITAL_BASED_OUTPATIENT_CLINIC_OR_DEPARTMENT_OTHER): Payer: PRIVATE HEALTH INSURANCE | Admitting: Hematology

## 2024-02-13 DIAGNOSIS — D472 Monoclonal gammopathy: Secondary | ICD-10-CM | POA: Diagnosis not present

## 2024-02-13 NOTE — Progress Notes (Signed)
 [facility]   [phone] [phone]) [phone]   Clinic Follow up Note   Patient Care Team: Purcell Emil Schanz, MD as PCP - General (Internal Medicine) Umberto Delon CROME, RN as Registered Nurse 02/13/2024  I connected with Dennis CROME Jackson everitt Dionisio on 02/13/24 at  3:00 PM EDT by telephone and verified that I am speaking with the correct person using two identifiers.   I discussed the limitations, risks, security and privacy concerns of performing an evaluation and management service by telephone and the availability of in person appointments. I also discussed with the patient that there may be a patient responsible charge related to this service. The patient expressed understanding and agreed to proceed.   Patient's location:  Home  Provider's location:  Office    CHIEF COMPLAINT: f/u lab results   Assessment & Plan MGUS (Monoclonal Gammopathy of Undetermined Significance), IgG Kappa type  MGUS with low risk of progression to multiple myeloma, approximately 1% per year. Current symptoms include arm pain, unrelated to MGUS. Recent bone survey shows no abnormalities. Regular monitoring of immunoglobulin levels is necessary to detect significant changes that may warrant further investigation, such as a bone marrow biopsy. - Order blood tests every 4 months initially to monitor immunoglobulin levels. - Extend blood test interval to every 6 months if immunoglobulin levels remain stable. - Consider bone marrow biopsy if immunoglobulin levels increase significantly. - Schedule follow-up appointment in 8 months after blood test.  Plan -lab results reviewed with pt and her daughter, overall low risk MGUS, I will hold on bone marrow biopsy for now -lab ever 4 months, f/u in 8 months     Discussed the use of AI scribe software for clinical note transcription with the patient, who gave verbal consent to proceed.  History of Present Illness Julia Green  is a 53 year old female with MGUS who presents for follow-up of lab results.  She has low-risk monoclonal gammopathy of undetermined significance (MGUS) and undergoes regular monitoring of immunoglobulin levels. Chronic arm pain, particularly around the elbow extending to the shoulder, persists but does not involve larger bones such as the spine, chest, or pelvis. A bone survey was previously conducted to evaluate this pain.     REVIEW OF SYSTEMS:   Constitutional: Denies fevers, chills or abnormal weight loss Eyes: Denies blurriness of vision Ears, nose, mouth, throat, and face: Denies mucositis or sore throat Respiratory: Denies cough, dyspnea or wheezes Cardiovascular: Denies palpitation, chest discomfort or lower extremity swelling Gastrointestinal:  Denies nausea, heartburn or change in bowel habits Skin: Denies abnormal skin rashes Lymphatics: Denies new lymphadenopathy or easy bruising Neurological:Denies numbness, tingling or new weaknesses Behavioral/Psych: Mood is stable, no new changes  All other systems were reviewed with the patient and are negative.  MEDICAL HISTORY:  Past Medical History:  Diagnosis Date   Arthritis    Asthma    Depression    Fatty liver    Gallstones    GERD (gastroesophageal reflux disease)    Headache    History of low potassium    Hypertension    Hypothyroidism    Right arm numbness    Right leg numbness    Sciatic nerve pain    Thyroid disease    Urticaria     SURGICAL HISTORY: Past Surgical History:  Procedure Laterality Date   ABDOMINAL HYSTERECTOMY     APPENDECTOMY     CARPAL TUNNEL RELEASE Right 08/07/2019   Procedure: RIGHT CARPAL TUNNEL  RELEASE;  Surgeon: Jerri Kay HERO, MD;  Location: [facility];  Service: Orthopedics;  Laterality: Right;   COLONOSCOPY     LAPAROSCOPIC RIGHT HEMI COLECTOMY N/A 11/26/2020   Procedure: LAPAROSCOPIC RIGHT HEMI COLECTOMY;  Surgeon: Teresa Lonni HERO, MD;  Location: [facility];   Service: General;  Laterality: N/A;   LAPAROSCOPIC VAGINAL HYSTERECTOMY WITH SALPINGECTOMY Bilateral 06/26/2017   Procedure: LAPAROSCOPIC ASSISTED VAGINAL HYSTERECTOMY WITH SALPINGECTOMY;  Surgeon: Lavoie, Marie-Lyne, MD;  Location: [facility];  Service: Gynecology;  Laterality: Bilateral;  request 7:30am OR time  requests 2 hours Rex the lighted retractor rep will be here    UPPER GI ENDOSCOPY      I have reviewed the social history and family history with the patient and they are unchanged from previous note.  ALLERGIES:  has no known allergies.  MEDICATIONS:  Current Outpatient Medications  Medication Sig Dispense Refill   albuterol  (ACCUNEB ) 1.25 MG/3ML nebulizer solution Take by nebulization every 6 (six) hours as needed.     albuterol  (VENTOLIN  HFA) 108 (90 Base) MCG/ACT inhaler Inhale 2 puffs into the lungs every 6 (six) hours as needed for wheezing or shortness of breath. 8 g 2   aspirin EC 81 MG tablet Take 81 mg by mouth daily.     azelastine  (ASTELIN ) 0.1 % nasal spray Place 2 sprays into both nostrils 2 (two) times daily. Use in each nostril as directed 30 mL 5   budesonide  (PULMICORT ) 0.25 MG/2ML nebulizer solution Take 2 mLs (0.25 mg total) by nebulization in the morning and at bedtime. 120 mL 3   calcium carbonate (OSCAL) 1500 (600 Ca) MG TABS tablet Take 1,500 mg by mouth daily.     cetirizine-pseudoephedrine (ZYRTEC-D) 5-120 MG tablet Take 1 tablet by mouth every 12 (twelve) hours.     estradiol  (ESTRACE ) 1 MG tablet Take 1 tablet (1 mg total) by mouth daily. 90 tablet 4   fluticasone  (FLONASE ) 50 MCG/ACT nasal spray Place 2 sprays into both nostrils daily. 16 g 3   levocetirizine (XYZAL ) 5 MG tablet Take 1 tablet (5 mg total) by mouth every evening. 20 tablet 1   levothyroxine  (SYNTHROID ) 112 MCG tablet TAKE 1 TABLET BY MOUTH EVERY MORNING*RETURN TO CLINIC IN 6 WEEKS FOR TSH RECHECK 90 tablet 3   losartan -hydrochlorothiazide  (HYZAAR) 100-12.5 MG tablet Take 1 tablet by mouth  daily. 90 tablet 3   montelukast  (SINGULAIR ) 10 MG tablet Take 1 tablet (10 mg total) by mouth at bedtime. 90 tablet 3   Omega-3 Fatty Acids (FISH OIL PO) Take by mouth.     omeprazole  (PRILOSEC) 40 MG capsule Take 1 capsule (40 mg total) by mouth daily. 90 capsule 1   potassium chloride  SA (KLOR-CON  M) 20 MEQ tablet Take 1 tablet (20 mEq total) by mouth 2 (two) times daily. For one week, then 1 tab dialy for one more week. 21 tablet 0   vitamin C (ASCORBIC ACID) 500 MG tablet Take 500 mg by mouth daily.     zolpidem  (AMBIEN ) 5 MG tablet Take 1 tablet (5 mg total) by mouth at bedtime as needed for sleep. 15 tablet 1   No current facility-administered medications for this visit.    PHYSICAL EXAMINATION: Not performed   LABORATORY DATA:  I have reviewed the data as listed    Latest Ref Rng & Units 01/23/2024    3:30 PM 05/25/2023    4:21 PM 03/09/2023    4:16 PM  CBC  WBC 4.0 - 10.5 K/uL 8.1  9.5  11.3   Hemoglobin 12.0 - 15.0 g/dL 87.9  87.4  87.1   Hematocrit 36.0 - 46.0 % 35.2  37.0  39.1   Platelets 150 - 400 K/uL 351  329.0  295.0         Latest Ref Rng & Units 01/23/2024    3:30 PM 11/15/2023   11:36 AM 05/25/2023    4:21 PM  CMP  Glucose 70 - 99 mg/dL 898   894   BUN 6 - 20 mg/dL 9   11   Creatinine 9.55 - 1.00 mg/dL 9.29   9.16   Sodium 864 - 145 mmol/L 143   140   Potassium 3.5 - 5.1 mmol/L 2.8   3.5   Chloride 98 - 111 mmol/L 105   99   CO2 22 - 32 mmol/L 31   31   Calcium 8.9 - 10.3 mg/dL 9.0   9.1   Total Protein 6.5 - 8.1 g/dL 7.7  7.7  7.3   Total Bilirubin 0.0 - 1.2 mg/dL 0.2   0.2   Alkaline Phos 38 - 126 U/L 85   69   AST 15 - 41 U/L 28   26   ALT 0 - 44 U/L 27   22       RADIOGRAPHIC STUDIES: I have personally reviewed the radiological images as listed and agreed with the findings in the report. No results found.     I discussed the assessment and treatment plan with the patient. The patient was provided an opportunity to ask questions and all were  answered. The patient agreed with the plan and demonstrated an understanding of the instructions.   The patient was advised to call back or seek an in-person evaluation if the symptoms worsen or if the condition fails to improve as anticipated.  I provided 15 minutes of non face-to-face telephone visit time during this encounter, including review of chart and various tests results, discussions about plan of care and coordination of care plan.    Onita Mattock, MD 02/13/24

## 2024-02-15 ENCOUNTER — Telehealth: Payer: Self-pay | Admitting: Hematology

## 2024-02-16 ENCOUNTER — Ambulatory Visit (HOSPITAL_COMMUNITY)
Admission: RE | Admit: 2024-02-16 | Discharge: 2024-02-16 | Disposition: A | Discharge status: Home / Self Care | Payer: PRIVATE HEALTH INSURANCE | Source: Ambulatory Visit | Attending: Family Medicine | Admitting: Family Medicine

## 2024-02-16 DIAGNOSIS — R911 Solitary pulmonary nodule: Secondary | ICD-10-CM | POA: Diagnosis present

## 2024-02-19 ENCOUNTER — Encounter: Payer: Self-pay | Admitting: Neurology

## 2024-02-19 ENCOUNTER — Ambulatory Visit (INDEPENDENT_AMBULATORY_CARE_PROVIDER_SITE_OTHER): Payer: PRIVATE HEALTH INSURANCE | Admitting: Neurology

## 2024-02-19 VITALS — BP 127/85 | HR 62 | Ht 62.0 in | Wt 146.0 lb

## 2024-02-19 DIAGNOSIS — E663 Overweight: Secondary | ICD-10-CM

## 2024-02-19 DIAGNOSIS — R5383 Other fatigue: Secondary | ICD-10-CM

## 2024-02-19 DIAGNOSIS — R0683 Snoring: Secondary | ICD-10-CM

## 2024-02-19 DIAGNOSIS — Z9189 Other specified personal risk factors, not elsewhere classified: Secondary | ICD-10-CM | POA: Diagnosis not present

## 2024-02-19 DIAGNOSIS — R351 Nocturia: Secondary | ICD-10-CM

## 2024-02-19 DIAGNOSIS — R519 Headache, unspecified: Secondary | ICD-10-CM

## 2024-02-19 NOTE — Patient Instructions (Signed)

## 2024-02-19 NOTE — Progress Notes (Signed)
 Subjective:    Patient ID: Julia Green is a 53 y.o. female.  HPI    True Mar, MD, PhD [facility] [address] [address] [place], [place] [address]  Dear Dr. Purcell,   I saw your patient, Julia Green, upon your kind request in my sleep clinic today for initial consultation of her sleep disorder, in particular, concern for underlying obstructive sleep apnea. The patient is accompanied by a [place] interpreter (Julia Green) today. As you know, the patient is a very pleasant 53 year old female with an underlying medical history of reflux disease, fatty liver, hypothyroidism, asthma, sciatica, hypertension, arthritis, depression and overweight state, who reports intermittent snoring and difficulty initiating and maintaining sleep for years.  Her Epworth sleepiness score is 3 out of 24, fatigue severity score is 51 out of 63.  She has never had a sleep study.  She has tried Ambien  recently and felt it was somewhat helpful at 5 mg strength.  She wakes up with a headache in the middle of the night and has also woken up with a headache in the mornings.  She has significant nocturia about 4-5 times per average night and reports that she is supposed to see a urologist soon.  She has woken up occasionally with a sense of gasping for air.  She is not aware of any family history of sleep apnea.  She is divorced for many years and lives with her 83 year old daughter.  She works in a [employer] architect.  She goes to bed between 9 and 10 PM and rise time is around 4:30 AM.  She drinks caffeine in the form of coffee, 1 cup of black coffee in the mornings and sometimes chamomile tea in the evenings.  She does not drink any alcohol.  She is a non-smoker.  She has a TV in her bedroom and turns it off when she goes to bed to sleep.  They have 4 dogs in the household, the dog sleeps in her bedroom on the floor but not on the bed.   Her Past Medical History Is Significant  For: Past Medical History:  Diagnosis Date   Arthritis    Asthma    Depression    Fatty liver    Gallstones    GERD (gastroesophageal reflux disease)    Headache    History of low potassium    Hypertension    Hypothyroidism    Right arm numbness    Right leg numbness    Sciatic nerve pain    Thyroid disease    Urticaria     Her Past Surgical History Is Significant For: Past Surgical History:  Procedure Laterality Date   ABDOMINAL HYSTERECTOMY     APPENDECTOMY  2020   CARPAL TUNNEL RELEASE Right 08/07/2019   Procedure: RIGHT CARPAL TUNNEL RELEASE;  Surgeon: Jerri Kay HERO, MD;  Location: [facility];  Service: Orthopedics;  Laterality: Right;   COLONOSCOPY     LAPAROSCOPIC RIGHT HEMI COLECTOMY N/A 11/26/2020   Procedure: LAPAROSCOPIC RIGHT HEMI COLECTOMY;  Surgeon: Teresa Lonni HERO, MD;  Location: [facility];  Service: General;  Laterality: N/A;   LAPAROSCOPIC VAGINAL HYSTERECTOMY WITH SALPINGECTOMY Bilateral 06/26/2017   Procedure: LAPAROSCOPIC ASSISTED VAGINAL HYSTERECTOMY WITH SALPINGECTOMY;  Surgeon: Lavoie, Marie-Lyne, MD;  Location: [facility];  Service: Gynecology;  Laterality: Bilateral;  request 7:30am OR time  requests 2 hours Rex the lighted retractor rep will be here    UPPER GI ENDOSCOPY  Her Family History Is Significant For: Family History  Problem Relation Age of Onset   Asthma Mother    Hypertension Mother    Uterine cancer Mother    Kidney failure Father    Lung disease Father    High blood pressure Father    Cancer Sister        unknown   Kidney failure Sister    Stomach cancer Maternal Aunt    Eczema Niece    Colon cancer Neg Hx    Esophageal cancer Neg Hx    Rectal cancer Neg Hx     Her Social History Is Significant For: Social History   Socioeconomic History   Marital status: Divorced    Spouse name: Not on file   Number of children: 4   Years of education: Not on file   Highest education level: 12th grade   Occupational History   Not on file  Tobacco Use   Smoking status: Never    Passive exposure: Never   Smokeless tobacco: Never  Vaping Use   Vaping status: Never Used  Substance and Sexual Activity   Alcohol use: No   Drug use: No   Sexual activity: Not Currently    Partners: Male    Birth control/protection: Surgical    Comment: first time sexual intercourse 53 years old, less than 5   Other Topics Concern   Not on file  Social History Narrative   Lives with her daughter   Right handed   Caffeine: 1 cup in the morning   Social Drivers of Health   Financial Resource Strain: Low Risk  (01/06/2024)   Overall Financial Resource Strain (CARDIA)    Difficulty of Paying Living Expenses: Not very hard  Food Insecurity: Low Risk  (01/25/2024)   Received from [facility]   Hunger Vital Sign    Within the past 12 months, you worried that your food would run out before you got money to buy more: Never true    Within the past 12 months, the food you bought just didn't last and you didn't have money to get more. : Never true  Recent Concern: Food Insecurity - Food Insecurity Present (01/23/2024)   Hunger Vital Sign    Worried About Running Out of Food in the Last Year: Sometimes true    Ran Out of Food in the Last Year: Sometimes true  Transportation Needs: No Transportation Needs (01/25/2024)   Received from [facility]    In the past 12 months, has lack of reliable transportation kept you from medical appointments, meetings, work or from getting things needed for daily living? : No  Physical Activity: Sufficiently Active (01/06/2024)   Exercise Vital Sign    Days of Exercise per Week: 5 days    Minutes of Exercise per Session: 30 min  Stress: No Stress Concern Present (01/06/2024)   [employer] of Occupational Health - Occupational Stress Questionnaire    Feeling of Stress : Not at all  Social Connections: Moderately Isolated (01/06/2024)   Social Connection and  Isolation Panel    Frequency of Communication with Friends and Family: Three times a week    Frequency of Social Gatherings with Friends and Family: Once a week    Attends Religious Services: More than 4 times per year    Active Member of [employer] or Organizations: No    Attends Engineer, structural: Not on file    Marital Status: Divorced    Her Allergies  Are:  No Known Allergies:   Her Current Medications Are:  Outpatient Encounter Medications as of 02/19/2024  Medication Sig   albuterol  (ACCUNEB ) 1.25 MG/3ML nebulizer solution Take by nebulization every 6 (six) hours as needed.   albuterol  (VENTOLIN  HFA) 108 (90 Base) MCG/ACT inhaler Inhale 2 puffs into the lungs every 6 (six) hours as needed for wheezing or shortness of breath.   aspirin EC 81 MG tablet Take 81 mg by mouth daily.   azelastine  (ASTELIN ) 0.1 % nasal spray Place 2 sprays into both nostrils 2 (two) times daily. Use in each nostril as directed   budesonide  (PULMICORT ) 0.25 MG/2ML nebulizer solution Take 2 mLs (0.25 mg total) by nebulization in the morning and at bedtime.   calcium carbonate (OSCAL) 1500 (600 Ca) MG TABS tablet Take 1,500 mg by mouth daily.   estradiol  (ESTRACE ) 1 MG tablet Take 1 tablet (1 mg total) by mouth daily.   fluticasone  (FLONASE ) 50 MCG/ACT nasal spray Place 2 sprays into both nostrils daily.   levothyroxine  (SYNTHROID ) 112 MCG tablet TAKE 1 TABLET BY MOUTH EVERY MORNING*RETURN TO CLINIC IN 6 WEEKS FOR TSH RECHECK   losartan -hydrochlorothiazide  (HYZAAR) 100-12.5 MG tablet Take 1 tablet by mouth daily.   montelukast  (SINGULAIR ) 10 MG tablet Take 1 tablet (10 mg total) by mouth at bedtime.   Omega-3 Fatty Acids (FISH OIL PO) Take by mouth.   omeprazole  (PRILOSEC) 40 MG capsule Take 1 capsule (40 mg total) by mouth daily.   potassium chloride  SA (KLOR-CON  M) 20 MEQ tablet Take 1 tablet (20 mEq total) by mouth 2 (two) times daily. For one week, then 1 tab dialy for one more week.   vitamin C  (ASCORBIC ACID) 500 MG tablet Take 500 mg by mouth daily.   cetirizine-pseudoephedrine (ZYRTEC-D) 5-120 MG tablet Take 1 tablet by mouth every 12 (twelve) hours. (Patient not taking: Reported on 02/19/2024)   levocetirizine (XYZAL ) 5 MG tablet Take 1 tablet (5 mg total) by mouth every evening. (Patient not taking: Reported on 02/19/2024)   zolpidem  (AMBIEN ) 5 MG tablet Take 1 tablet (5 mg total) by mouth at bedtime as needed for sleep. (Patient not taking: Reported on 02/19/2024)   No facility-administered encounter medications on file as of 02/19/2024.  :   Review of Systems:  Out of a complete 14 point review of systems, all are reviewed and negative with the exception of these symptoms as listed below:  Review of Systems  Neurological:        Patient is here with Julia Green, Spanish interpreter for sleep consult. Patient reports she tosses and turns and cannot sleep. She is tired during the day. She woke up with a pulsating headache today. She doesn't know if she snores. Patient states she was given a temporary trial of Ambien  but it did not help so she was referred here. She has never had a sleep study. Her mother has to take pills to be able to sleep but she doesn't wear a cpap machine. ESS 3 FSS 41    Objective:  Neurological Exam  Physical Exam Physical Examination:   Vitals:   02/19/24 1515  BP: 127/85  Pulse: 62    General Examination: The patient is a very pleasant 53 y.o. female in no acute distress. She appears well-developed and well-nourished and well groomed.   HEENT: Normocephalic, atraumatic, pupils are equal, round and reactive to light, extraocular tracking is good without limitation to gaze excursion or nystagmus noted.  Corrective eyeglasses in place.  Hearing is  grossly intact. Face is symmetric with normal facial animation. Speech is clear with no dysarthria noted. There is no hypophonia. There is no lip, neck/head, jaw or voice tremor. Neck is supple with full range of  passive and active motion. There are no carotid bruits on auscultation. Oropharynx exam reveals: mild mouth dryness, good dental hygiene and mild airway crowding, due to small airway entry, Mallampati class II, tonsils on the small side, neck circumference 15 inches, mild overbite noted.  Tongue protrudes centrally and palate elevates symmetrically.  Chest: Clear to auscultation without wheezing, rhonchi or crackles noted.  Heart: S1+S2+0, regular and normal without murmurs, rubs or gallops noted.   Abdomen: Soft, non-tender and non-distended.  Extremities: There is no pitting edema in the distal lower extremities bilaterally.   Skin: Warm and dry without trophic changes noted.   Musculoskeletal: exam reveals no obvious joint deformities.   Neurologically:  Mental status: The patient is awake, alert and oriented in all 4 spheres. Her immediate and remote memory, attention, language skills and fund of knowledge are appropriate. There is no evidence of aphasia, agnosia, apraxia or anomia. Speech is clear with normal prosody and enunciation. Thought process is linear. Mood is normal and affect is normal.  Cranial nerves II - XII are as described above under HEENT exam.  Motor exam: Normal bulk, strength and tone is noted. There is no obvious action or resting tremor.  Fine motor skills and coordination: grossly intact.  Cerebellar testing: No dysmetria or intention tremor. There is no truncal or gait ataxia.  Sensory exam: intact to light touch in the upper and lower extremities.  Gait, station and balance: She stands easily. No veering to one side is noted. No leaning to one side is noted. Posture is age-appropriate and stance is narrow based. Gait shows normal stride length and normal pace. No problems turning are noted.   Assessment and Plan:    In summary, Julia Green is a very pleasant 53 y.o.-year old female with an underlying medical history of reflux disease, fatty liver,  hypothyroidism, asthma, sciatica, hypertension, arthritis, depression and overweight state, whose history and physical exam are concerning for sleep disordered breathing, particularly obstructive sleep apnea (OSA). A laboratory attended sleep study is typically considered gold standard for evaluation of sleep disordered breathing.   I had a long chat with the patient about my findings and the diagnosis of sleep apnea, particularly OSA, its prognosis and treatment options. We talked about medical/conservative treatments, surgical interventions and non-pharmacological approaches for symptom control. I explained, in particular, the risks and ramifications of untreated moderate to severe OSA, especially with respect to developing cardiovascular disease down the road, including congestive heart failure (CHF), difficult to treat hypertension, cardiac arrhythmias (particularly A-fib), neurovascular complications including TIA, stroke and dementia. Even type 2 diabetes has, in part, been linked to untreated OSA. Symptoms of untreated OSA may include (but may not be limited to) daytime sleepiness, nocturia (i.e. frequent nighttime urination), memory problems, mood irritability and suboptimally controlled or worsening mood disorder such as depression and/or anxiety, lack of energy, lack of motivation, physical discomfort, as well as recurrent headaches, especially morning or nocturnal headaches. We talked about the importance of maintaining a healthy lifestyle and striving for healthy weight. In addition, we talked about the importance of striving for and maintaining good sleep hygiene. I recommended a sleep study at this time. I outlined the differences between a laboratory attended sleep study which is considered more comprehensive and accurate over  the option of a home sleep test (HST); the latter may lead to underestimation of sleep disordered breathing in some instances and does not help with diagnosing upper airway  resistance syndrome and is not accurate enough to diagnose primary central sleep apnea typically. I outlined possible surgical and non-surgical treatment options of OSA, including the use of a positive airway pressure (PAP) device (i.e. CPAP, AutoPAP/APAP or BiPAP in certain circumstances), a custom-made dental device (aka oral appliance, which would require a referral to a specialist dentist or orthodontist typically, and is generally speaking not considered for patients with full dentures or edentulous state), upper airway surgical options, such as traditional UPPP (which is not considered a first-line treatment) or the Inspire device (hypoglossal nerve stimulator, which would involve a referral for consultation with an ENT surgeon, after careful selection, following inclusion criteria - also not first-line treatment). I explained the PAP treatment option to the patient in detail, as this is generally considered first-line treatment.  The patient indicated that she would be willing to try PAP therapy, if the need arises.  We will pick up our discussion about the next steps and treatment options after testing.  We will keep her posted as to the test results by phone call and/or MyChart messaging where possible.  We will plan to follow-up in sleep clinic accordingly as well.  I answered all her questions today and the patient was in agreement.   I encouraged her to call with any interim questions, concerns, problems or updates or email us  through MyChart.  Generally speaking, sleep test authorizations may take up to 2 weeks, sometimes less, sometimes longer, the patient is encouraged to get in touch with us  if they do not hear back from the sleep lab staff directly within the next 2 weeks.  Thank you very much for allowing me to participate in the care of this nice patient. If I can be of any further assistance to you please do not hesitate to call me at [phone].  Sincerely,   True Mar, MD,  PhD

## 2024-02-22 ENCOUNTER — Telehealth: Payer: Self-pay | Admitting: Neurology

## 2024-02-22 NOTE — Telephone Encounter (Signed)
 [facility] no auth req spoke to Darla ref # [id number]   Patient is scheduled at [facility] for 03/22/24 at 9 pm.  Mailed packet and sent mychart.

## 2024-02-22 NOTE — Telephone Encounter (Signed)
 [facility] no auth req spoke to Darla ref # [id number]   Sent mychart

## 2024-02-26 ENCOUNTER — Encounter: Payer: Self-pay | Admitting: Obstetrics and Gynecology

## 2024-02-26 ENCOUNTER — Ambulatory Visit (INDEPENDENT_AMBULATORY_CARE_PROVIDER_SITE_OTHER): Payer: PRIVATE HEALTH INSURANCE | Admitting: Obstetrics and Gynecology

## 2024-02-26 VITALS — BP 128/78 | HR 77 | Ht 61.0 in | Wt 147.0 lb

## 2024-02-26 DIAGNOSIS — Z01419 Encounter for gynecological examination (general) (routine) without abnormal findings: Secondary | ICD-10-CM

## 2024-02-26 DIAGNOSIS — Z1331 Encounter for screening for depression: Secondary | ICD-10-CM

## 2024-02-26 DIAGNOSIS — Z1231 Encounter for screening mammogram for malignant neoplasm of breast: Secondary | ICD-10-CM

## 2024-02-26 MED ORDER — ESTRADIOL 1 MG PO TABS: 1.0000 mg | ORAL_TABLET | Freq: Every day | ORAL | 4 refills | Status: AC

## 2024-02-26 NOTE — Progress Notes (Unsigned)
 53 y.o. y.o. female here for annual exam. Patient's last menstrual period was 03/24/2017.    [id number] Married   RP:  Established patient presenting for annual gyn exam    HPI: S/P LAVH ovaries intact.  Postmenopause well on HRT with Estradiol  1 mg tab PO daily. No pelvic pain.  No pain with intercourse. Pap Neg 10/2021. Repeat Pap at 5 years. Breasts normal. Mammo Neg 03/02/23. Health labs with family physician. COLONOSCOPY: 03-11-21. Being worked up for [employer]. Noticed 4 days of hematuria recently.  Reports she has appointment to see urology soon. Ikj:wnwz, no fractures Worried about out of pocket costs and has labs to still run    There is no height or weight on file to calculate BMI.     01/23/2024    2:56 PM 01/08/2024    3:53 PM 12/15/2023    3:11 PM  Depression screen PHQ 2/9  Decreased Interest 1 0 0  Down, Depressed, Hopeless 0 0 0  PHQ - 2 Score 1 0 0    Last menstrual period 03/24/2017.     Component Value Date/Time   DIAGPAP  11/19/2021 1426    - Negative for intraepithelial lesion or malignancy (NILM)   HPVHIGH Negative 11/19/2021 1426   ADEQPAP Satisfactory for evaluation. 11/19/2021 1426    GYN HISTORY:    Component Value Date/Time   DIAGPAP  11/19/2021 1426    - Negative for intraepithelial lesion or malignancy (NILM)   HPVHIGH Negative 11/19/2021 1426   ADEQPAP Satisfactory for evaluation. 11/19/2021 1426    OB History  Gravida Para Term Preterm AB Living  4 4    4   SAB IAB Ectopic Multiple Live Births          # Outcome Date GA Lbr Len/2nd Weight Sex Type Anes PTL Lv  4 Para           3 Para           2 Para           1 Para             Past Medical History:  Diagnosis Date   Arthritis    Asthma    Depression    Fatty liver    Gallstones    GERD (gastroesophageal reflux disease)    Headache    History of low potassium    Hypertension    Hypothyroidism    Right arm numbness    Right leg numbness    Sciatic nerve pain     Thyroid disease    Urticaria     Past Surgical History:  Procedure Laterality Date   ABDOMINAL HYSTERECTOMY     APPENDECTOMY  2020   CARPAL TUNNEL RELEASE Right 08/07/2019   Procedure: RIGHT CARPAL TUNNEL RELEASE;  Surgeon: Jerri Kay HERO, MD;  Location: [facility];  Service: Orthopedics;  Laterality: Right;   COLONOSCOPY     LAPAROSCOPIC RIGHT HEMI COLECTOMY N/A 11/26/2020   Procedure: LAPAROSCOPIC RIGHT HEMI COLECTOMY;  Surgeon: Teresa Lonni HERO, MD;  Location: [facility];  Service: General;  Laterality: N/A;   LAPAROSCOPIC VAGINAL HYSTERECTOMY WITH SALPINGECTOMY Bilateral 06/26/2017   Procedure: LAPAROSCOPIC ASSISTED VAGINAL HYSTERECTOMY WITH SALPINGECTOMY;  Surgeon: Lavoie, Marie-Lyne, MD;  Location: [facility];  Service: Gynecology;  Laterality: Bilateral;  request 7:30am OR time  requests 2 hours Rex the lighted retractor rep will be here    UPPER GI ENDOSCOPY      Current Outpatient Medications on File Prior  to Visit  Medication Sig Dispense Refill   albuterol  (ACCUNEB ) 1.25 MG/3ML nebulizer solution Take by nebulization every 6 (six) hours as needed.     albuterol  (VENTOLIN  HFA) 108 (90 Base) MCG/ACT inhaler Inhale 2 puffs into the lungs every 6 (six) hours as needed for wheezing or shortness of breath. 8 g 2   aspirin EC 81 MG tablet Take 81 mg by mouth daily.     azelastine  (ASTELIN ) 0.1 % nasal spray Place 2 sprays into both nostrils 2 (two) times daily. Use in each nostril as directed 30 mL 5   budesonide  (PULMICORT ) 0.25 MG/2ML nebulizer solution Take 2 mLs (0.25 mg total) by nebulization in the morning and at bedtime. 120 mL 3   calcium carbonate (OSCAL) 1500 (600 Ca) MG TABS tablet Take 1,500 mg by mouth daily.     estradiol  (ESTRACE ) 1 MG tablet Take 1 tablet (1 mg total) by mouth daily. 90 tablet 4   fluticasone  (FLONASE ) 50 MCG/ACT nasal spray Place 2 sprays into both nostrils daily. 16 g 3   levothyroxine  (SYNTHROID ) 112 MCG tablet TAKE 1 TABLET BY MOUTH  EVERY MORNING*RETURN TO CLINIC IN 6 WEEKS FOR TSH RECHECK 90 tablet 3   losartan -hydrochlorothiazide  (HYZAAR) 100-12.5 MG tablet Take 1 tablet by mouth daily. 90 tablet 3   montelukast  (SINGULAIR ) 10 MG tablet Take 1 tablet (10 mg total) by mouth at bedtime. 90 tablet 3   Omega-3 Fatty Acids (FISH OIL PO) Take by mouth.     omeprazole  (PRILOSEC) 40 MG capsule Take 1 capsule (40 mg total) by mouth daily. 90 capsule 1   potassium chloride  SA (KLOR-CON  M) 20 MEQ tablet Take 1 tablet (20 mEq total) by mouth 2 (two) times daily. For one week, then 1 tab dialy for one more week. 21 tablet 0   vitamin C (ASCORBIC ACID) 500 MG tablet Take 500 mg by mouth daily.     cetirizine-pseudoephedrine (ZYRTEC-D) 5-120 MG tablet Take 1 tablet by mouth every 12 (twelve) hours. (Patient not taking: Reported on 02/26/2024)     levocetirizine (XYZAL ) 5 MG tablet Take 1 tablet (5 mg total) by mouth every evening. (Patient not taking: Reported on 02/26/2024) 20 tablet 1   zolpidem  (AMBIEN ) 5 MG tablet Take 1 tablet (5 mg total) by mouth at bedtime as needed for sleep. (Patient not taking: Reported on 02/26/2024) 15 tablet 1   No current facility-administered medications on file prior to visit.    Social History   Socioeconomic History   Marital status: Divorced    Spouse name: Not on file   Number of children: 4   Years of education: Not on file   Highest education level: 12th grade  Occupational History   Not on file  Tobacco Use   Smoking status: Never    Passive exposure: Never   Smokeless tobacco: Never  Vaping Use   Vaping status: Never Used  Substance and Sexual Activity   Alcohol use: No   Drug use: No   Sexual activity: Not Currently    Partners: Male    Birth control/protection: Surgical    Comment: first time sexual intercourse 53 years old, less than 5   Other Topics Concern   Not on file  Social History Narrative   Lives with her daughter   Right handed   Caffeine: 1 cup in the morning    Social Drivers of Health   Financial Resource Strain: Low Risk  (01/06/2024)   Overall Financial Resource Strain (CARDIA)  Difficulty of Paying Living Expenses: Not very hard  Food Insecurity: Low Risk  (01/25/2024)   Received from [facility]   Hunger Vital Sign    Within the past 12 months, you worried that your food would run out before you got money to buy more: Never true    Within the past 12 months, the food you bought just didn't last and you didn't have money to get more. : Never true  Recent Concern: Food Insecurity - Food Insecurity Present (01/23/2024)   Hunger Vital Sign    Worried About Running Out of Food in the Last Year: Sometimes true    Ran Out of Food in the Last Year: Sometimes true  Transportation Needs: No Transportation Needs (01/25/2024)   Received from [facility]    In the past 12 months, has lack of reliable transportation kept you from medical appointments, meetings, work or from getting things needed for daily living? : No  Physical Activity: Sufficiently Active (01/06/2024)   Exercise Vital Sign    Days of Exercise per Week: 5 days    Minutes of Exercise per Session: 30 min  Stress: No Stress Concern Present (01/06/2024)   [employer] of Occupational Health - Occupational Stress Questionnaire    Feeling of Stress : Not at all  Social Connections: Moderately Isolated (01/06/2024)   Social Connection and Isolation Panel    Frequency of Communication with Friends and Family: Three times a week    Frequency of Social Gatherings with Friends and Family: Once a week    Attends Religious Services: More than 4 times per year    Active Member of [employer] or Organizations: No    Attends Engineer, structural: Not on file    Marital Status: Divorced  Intimate Partner Violence: Not At Risk (01/23/2024)   Humiliation, Afraid, Rape, and Kick questionnaire    Fear of Current or Ex-Partner: No    Emotionally Abused: No    Physically  Abused: No    Sexually Abused: No    Family History  Problem Relation Age of Onset   Asthma Mother    Hypertension Mother    Uterine cancer Mother    Kidney failure Father    Lung disease Father    High blood pressure Father    Cancer Sister        unknown   Kidney failure Sister    Stomach cancer Maternal Aunt    Eczema Niece    Colon cancer Neg Hx    Esophageal cancer Neg Hx    Rectal cancer Neg Hx      No Known Allergies    Patient's last menstrual period was Patient's last menstrual period was 03/24/2017.SABRA            Review of Systems Alls systems reviewed and are negative.     Physical Exam Constitutional:      Appearance: Normal appearance.  Genitourinary:     Vulva normal.     No lesions in the vagina.     Right Labia: No rash, lesions or skin changes.    Left Labia: No lesions, skin changes or rash.    Vaginal cuff intact.    No vaginal discharge or tenderness.     No vaginal prolapse present.    Mild vaginal atrophy present.     Right Adnexa: not absent.    Left Adnexa: not absent.    Cervix is absent.     Uterus is absent.  Breasts:    Right: Normal.     Left: Normal.  HENT:     Head: Normocephalic.  Neck:     Thyroid: No thyroid mass, thyromegaly or thyroid tenderness.  Cardiovascular:     Rate and Rhythm: Normal rate and regular rhythm.     Heart sounds: Normal heart sounds, S1 normal and S2 normal.  Pulmonary:     Effort: Pulmonary effort is normal.     Breath sounds: Normal breath sounds and air entry.  Abdominal:     General: Bowel sounds are normal. There is no distension.     Palpations: Abdomen is soft. There is no mass.     Tenderness: There is no abdominal tenderness. There is no guarding or rebound.  Musculoskeletal:     Cervical back: Full passive range of motion without pain, normal range of motion and neck supple. No tenderness.     Right lower leg: No edema.     Left lower leg: No edema.  Neurological:     Mental  Status: She is alert.  Skin:    General: Skin is warm.  Psychiatric:        Mood and Affect: Mood normal.        Behavior: Behavior normal.        Thought Content: Thought content normal.  Vitals and nursing note reviewed. Exam conducted with a chaperone present.       A:         Well Woman GYN exam                             P:        Pap smear not indicated Encouraged annual mammogram screening Colon cancer screening up-to-date DXA run next year Labs and immunizations to do with PMD Discussed breast self exams Encouraged healthy lifestyle practices Encouraged Vit D and Calcium   No follow-ups on file.  Almarie MARLA Carpen

## 2024-03-07 LAB — HM MAMMOGRAPHY

## 2024-03-08 ENCOUNTER — Other Ambulatory Visit: Payer: Self-pay | Admitting: Emergency Medicine

## 2024-03-08 DIAGNOSIS — M790 Rheumatism, unspecified: Secondary | ICD-10-CM

## 2024-03-08 DIAGNOSIS — M2550 Pain in unspecified joint: Secondary | ICD-10-CM

## 2024-03-08 DIAGNOSIS — F321 Major depressive disorder, single episode, moderate: Secondary | ICD-10-CM

## 2024-03-11 ENCOUNTER — Encounter: Payer: Self-pay | Admitting: Obstetrics and Gynecology

## 2024-03-22 ENCOUNTER — Ambulatory Visit (INDEPENDENT_AMBULATORY_CARE_PROVIDER_SITE_OTHER): Payer: PRIVATE HEALTH INSURANCE | Admitting: Neurology

## 2024-03-22 DIAGNOSIS — R0683 Snoring: Secondary | ICD-10-CM

## 2024-03-22 DIAGNOSIS — G4733 Obstructive sleep apnea (adult) (pediatric): Secondary | ICD-10-CM

## 2024-03-22 DIAGNOSIS — R5383 Other fatigue: Secondary | ICD-10-CM

## 2024-03-22 DIAGNOSIS — E663 Overweight: Secondary | ICD-10-CM

## 2024-03-22 DIAGNOSIS — R351 Nocturia: Secondary | ICD-10-CM

## 2024-03-22 DIAGNOSIS — Z9189 Other specified personal risk factors, not elsewhere classified: Secondary | ICD-10-CM

## 2024-03-22 DIAGNOSIS — G4720 Circadian rhythm sleep disorder, unspecified type: Secondary | ICD-10-CM

## 2024-03-22 DIAGNOSIS — R519 Headache, unspecified: Secondary | ICD-10-CM

## 2024-03-26 NOTE — Procedures (Unsigned)
 Physician Interpretation:   See procedure note and links.

## 2024-03-29 ENCOUNTER — Ambulatory Visit: Payer: Self-pay | Admitting: Neurology

## 2024-03-29 DIAGNOSIS — R5383 Other fatigue: Secondary | ICD-10-CM

## 2024-03-29 DIAGNOSIS — G4733 Obstructive sleep apnea (adult) (pediatric): Secondary | ICD-10-CM

## 2024-03-29 DIAGNOSIS — G4700 Insomnia, unspecified: Secondary | ICD-10-CM

## 2024-04-03 ENCOUNTER — Encounter: Payer: Self-pay | Admitting: Gastroenterology

## 2024-04-03 ENCOUNTER — Ambulatory Visit: Payer: PRIVATE HEALTH INSURANCE | Admitting: Gastroenterology

## 2024-04-03 VITALS — BP 138/86 | HR 74 | Ht 62.0 in | Wt 147.5 lb

## 2024-04-03 DIAGNOSIS — R1031 Right lower quadrant pain: Secondary | ICD-10-CM

## 2024-04-03 DIAGNOSIS — R194 Change in bowel habit: Secondary | ICD-10-CM | POA: Diagnosis not present

## 2024-04-03 DIAGNOSIS — K648 Other hemorrhoids: Secondary | ICD-10-CM | POA: Diagnosis not present

## 2024-04-03 DIAGNOSIS — K625 Hemorrhage of anus and rectum: Secondary | ICD-10-CM | POA: Diagnosis not present

## 2024-04-03 NOTE — Patient Instructions (Signed)
 You have been scheduled for a CT scan of the abdomen and pelvis at [facility], 1st floor Radiology. You are scheduled on 04/10/24 at 3 pm. You should arrive at 1 pm prior to your appointment time for registration and to start drinking contrast.  Please follow the written instructions below on the day of your exam:   1) Do not eat anything after 11 am (4 hours prior to your test)   You may take any medications as prescribed with a small amount of water, if necessary. If you take any of the following medications: METFORMIN, GLUCOPHAGE, GLUCOVANCE, AVANDAMET, RIOMET, FORTAMET, ACTOPLUS MET, JANUMET, GLUMETZA or METAGLIP, you MAY be asked to HOLD this medication 48 hours AFTER the exam.   The purpose of you drinking the oral contrast is to aid in the visualization of your intestinal tract. The contrast solution may cause some diarrhea. Depending on your individual set of symptoms, you may also receive an intravenous injection of x-ray contrast/dye. Plan on being at [facility] for 45 minutes or longer, depending on the type of exam you are having performed.   If you have any questions regarding your exam or if you need to reschedule, you may call [facility] at [phone] between the hours of 8:00 am and 5:00 pm, Monday-Friday.   _______________________________________________________  If your blood pressure at your visit was 140/90 or greater, please contact your primary care physician to follow up on this.  _______________________________________________________  If you are age 21 or older, your body mass index should be between 23-30. Your Body mass index is 26.98 kg/m. If this is out of the aforementioned range listed, please consider follow up with your Primary Care Provider.  If you are age 1 or younger, your body mass index should be between 19-25. Your Body mass index is 26.98 kg/m. If this is out of the aformentioned range listed, please consider follow up with your  Primary Care Provider.   ________________________________________________________  The [facility] providers would like to encourage you to use MYCHART to communicate with providers for non-urgent requests or questions.  Due to long hold times on the telephone, sending your provider a message by [facility] may be a faster and more efficient way to get a response.  Please allow 48 business hours for a response.  Please remember that this is for non-urgent requests.  _______________________________________________________  [facility] is using a team-based approach to care.  Your team is made up of your doctor and two to three APPS. Our APPS (Nurse Practitioners and Physician Assistants) work with your physician to ensure care continuity for you. They are fully qualified to address your health concerns and develop a treatment plan. They communicate directly with your gastroenterologist to care for you. Seeing the Advanced Practice Practitioners on your physician's team can help you by facilitating care more promptly, often allowing for earlier appointments, access to diagnostic testing, procedures, and other specialty referrals.    Thank you for trusting me with your gastrointestinal care!    Dr. Victory Legrand DOUGLAS [facility]

## 2024-04-03 NOTE — Progress Notes (Addendum)
 [facility] Consult Note:  History: Julia Green 04/03/2024  Referring provider: Purcell Emil Schanz, MD  Reason for consult/chief complaint: Abdominal Pain (Patient has been experiencing lower right sided pain for 3 to 4 years.) and Rectal Bleeding (In the past 2 month, patient has noticed blood in her stool. Per patient, this has happened 3 times.)   Subjective  HPI: [name] was evaluated in 2018 for chronic diarrhea felt likely to be IBS.  2018 EGD and colonoscopy to the terminal ileum normal, biopsies negative for celiac sprue or microscopic colitis.  No improvement with empiric SIBO treatment at that time.  Reported no improvement with Bentyl  given by primary care provider prior to evaluation in our office.  Thought initially to have EPI due to low fecal elastase, but no improvement on pancreatic enzyme supplements, and ultimate conclusion was falsely low (diluted) value from watery stool. Reevaluated in 2022 for worsening diarrhea after right hemicolectomy for acute suppurative appendicitis.  Surgical pathology result suggested that that inflammation may have been Crohn's related. Colonoscopy July 2022 showed an ulcerated ileocolonic anastomosis, no visible ileitis in the neoterminal ileum, colonic mucosa normal but random biopsies reported focal active colitis.  No microscopic description of whether there were lymphocytes or thickened collagen layer.   No improvement with second empiric treatment for SIBO.  Patient was given months of budesonide  for that but reported that it did not improve the diarrhea.    Had not tried bile acid binding agent with concerns it may be logistically challenging with her other medicine regimen. When evaluated in clinic February 2023, was given a trial of Viberzi  samples -subsequent phone messages indicated this medicine was helping but it was cost prohibitive.  Lomotil  was prescribed as an alternative.  November 2019 CT  abdomen pelvis reported tiny gallstones ______________  Dennis was here with a female Spanish interpreter today.  She has frequent sharp right lower quadrant pain that has been going on since her right hemicolectomy in April 2022.  Pain comes and goes, but is there more days than not.  It is severe and crampy and sometimes radiates to the left upper quadrant.  When it occurs she feels that she just has to lay down because she cannot even walk or do anything else.  Often associated with bloating when she feels like I am pregnant.  Pain does not necessarily come on with BMs.  Pain episodes seem to be more frequent and severe this year. Her diarrhea has been inexplicably improved from what it was in the last time I saw her.  She will have on average 2 BMs per day that are sometimes formed and sometimes loose.  In the last few months she has had 3 episodes of some blood on the outside of the stool, she showed me a photo of that today on her phone. Appetite generally good, weight reportedly stable  ROS:  Review of Systems  Constitutional:  Negative for appetite change and unexpected weight change.  HENT:  Negative for mouth sores and voice change.   Eyes:  Negative for pain and redness.  Respiratory:  Negative for cough and shortness of breath.   Cardiovascular:  Negative for chest pain and palpitations.  Genitourinary:  Negative for dysuria and hematuria.  Musculoskeletal:  Negative for arthralgias and myalgias.  Skin:  Negative for pallor and rash.  Neurological:  Negative for weakness and headaches.  Hematological:  Negative for adenopathy.     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, IgG Kappa type  Past Surgical History: Past Surgical History:  Procedure Laterality Date   ABDOMINAL HYSTERECTOMY     APPENDECTOMY  2020   CARPAL TUNNEL RELEASE Right 08/07/2019   Procedure: RIGHT CARPAL TUNNEL RELEASE;  Surgeon: Jerri Kay HERO, MD;  Location: [facility];  Service: Orthopedics;  Laterality: Right;   COLONOSCOPY     LAPAROSCOPIC RIGHT HEMI COLECTOMY N/A 11/26/2020   Procedure: LAPAROSCOPIC RIGHT HEMI COLECTOMY;  Surgeon: Teresa Lonni HERO, MD;  Location: [facility];  Service: General;  Laterality: N/A;   LAPAROSCOPIC VAGINAL HYSTERECTOMY WITH SALPINGECTOMY Bilateral 06/26/2017   Procedure: LAPAROSCOPIC ASSISTED VAGINAL HYSTERECTOMY WITH SALPINGECTOMY;  Surgeon: Lavoie, Marie-Lyne, MD;  Location: [facility];  Service: Gynecology;  Laterality: Bilateral;  request 7:30am OR time  requests 2 hours Rex the lighted retractor rep will be here    UPPER GI ENDOSCOPY       Family History: Family History  Problem Relation Age of Onset   Asthma Mother    Hypertension Mother    Uterine cancer Mother    Kidney failure Father    Lung disease Father    High blood pressure Father    Cancer Sister        unknown   Kidney failure Sister    Stomach cancer Maternal Aunt    Eczema Niece    Colon cancer Neg Hx    Esophageal cancer Neg Hx    Rectal cancer Neg Hx     Social History: Social History   Socioeconomic History   Marital status: Divorced    Spouse name: Not on file   Number of children: 4   Years of education: Not on file   Highest education level: 12th grade  Occupational History   Not on file  Tobacco Use   Smoking status: Never    Passive exposure: Never   Smokeless tobacco: Never  Vaping Use   Vaping status: Never Used  Substance and Sexual Activity   Alcohol use: No   Drug use: No   Sexual activity: Yes    Partners: Male    Birth control/protection: Surgical    Comment: first time sexual  intercourse 53 years old, less than 5   Other Topics Concern   Not on file  Social History Narrative   Lives with her daughter   Right handed   Caffeine: 1 cup in the morning   Social Drivers of Health   Financial Resource Strain: Low Risk  (01/06/2024)   Overall Financial Resource Strain (CARDIA)    Difficulty of Paying Living Expenses: Not very hard  Food Insecurity: Low Risk  (01/25/2024)   Received from [facility]   Hunger Vital Sign    Within the past 12 months, you worried that your food would run out before you got money to buy more: Never true    Within the past 12 months, the food you bought just didn't last and you didn't have money to get more. : Never true  Recent  Concern: Food Insecurity - Food Insecurity Present (01/23/2024)   Hunger Vital Sign    Worried About Running Out of Food in the Last Year: Sometimes true    Ran Out of Food in the Last Year: Sometimes true  Transportation Needs: No Transportation Needs (01/25/2024)   Received from [facility]    In the past 12 months, has lack of reliable transportation kept you from medical appointments, meetings, work or from getting things needed for daily living? : No  Physical Activity: Sufficiently Active (01/06/2024)   Exercise Vital Sign    Days of Exercise per Week: 5 days    Minutes of Exercise per Session: 30 min  Stress: No Stress Concern Present (01/06/2024)   [employer] of Occupational Health - Occupational Stress Questionnaire    Feeling of Stress : Not at all  Social Connections: Moderately Isolated (01/06/2024)   Social Connection and Isolation Panel    Frequency of Communication with Friends and Family: Three times a week    Frequency of Social Gatherings with Friends and Family: Once a week    Attends Religious Services: More than 4 times per year    Active Member of [employer] or Organizations: No    Attends Engineer, structural: Not on file    Marital Status: Divorced     Allergies: No Known Allergies  Outpatient Meds: Current Outpatient Medications  Medication Sig Dispense Refill   albuterol  (ACCUNEB ) 1.25 MG/3ML nebulizer solution Take by nebulization every 6 (six) hours as needed.     albuterol  (VENTOLIN  HFA) 108 (90 Base) MCG/ACT inhaler Inhale 2 puffs into the lungs every 6 (six) hours as needed for wheezing or shortness of breath. 8 g 2   aspirin EC 81 MG tablet Take 81 mg by mouth daily.     budesonide  (PULMICORT ) 0.25 MG/2ML nebulizer solution Take 2 mLs (0.25 mg total) by nebulization in the morning and at bedtime. 120 mL 3   calcium carbonate (OSCAL) 1500 (600 Ca) MG TABS tablet Take 1,500 mg by mouth daily.     Cyanocobalamin (VITAMIN B-12 PO) Take by mouth daily.     estradiol  (ESTRACE ) 1 MG tablet Take 1 tablet (1 mg total) by mouth daily. 90 tablet 4   fluticasone  (FLONASE ) 50 MCG/ACT nasal spray Place 2 sprays into both nostrils daily. 16 g 3   levothyroxine  (SYNTHROID ) 112 MCG tablet TAKE 1 TABLET BY MOUTH EVERY MORNING*RETURN TO CLINIC IN 6 WEEKS FOR TSH RECHECK 90 tablet 3   losartan -hydrochlorothiazide  (HYZAAR) 100-12.5 MG tablet Take 1 tablet by mouth daily. 90 tablet 3   montelukast  (SINGULAIR ) 10 MG tablet Take 1 tablet (10 mg total) by mouth at bedtime. 90 tablet 3   Omega-3 Fatty Acids (FISH OIL PO) Take by mouth.     omeprazole  (PRILOSEC) 40 MG capsule Take 1 capsule (40 mg total) by mouth daily. 90 capsule 1   vitamin C (ASCORBIC ACID) 500 MG tablet Take 500 mg by mouth daily.     azelastine  (ASTELIN ) 0.1 % nasal spray Place 2 sprays into both nostrils 2 (two) times daily. Use in each nostril as directed (Patient not taking: Reported on 04/03/2024) 30 mL 5   cetirizine-pseudoephedrine (ZYRTEC-D) 5-120 MG tablet Take 1 tablet by mouth every 12 (twelve) hours. (Patient not taking: Reported on 04/03/2024)     levocetirizine (XYZAL ) 5 MG tablet Take 1 tablet (5 mg total) by mouth every evening. (Patient not taking: Reported on  04/03/2024) 20 tablet 1   potassium  chloride SA (KLOR-CON  M) 20 MEQ tablet Take 1 tablet (20 mEq total) by mouth 2 (two) times daily. For one week, then 1 tab dialy for one more week. (Patient not taking: Reported on 04/03/2024) 21 tablet 0   zolpidem  (AMBIEN ) 5 MG tablet Take 1 tablet (5 mg total) by mouth at bedtime as needed for sleep. (Patient not taking: Reported on 04/03/2024) 15 tablet 1   No current facility-administered medications for this visit.      ___________________________________________________________________ Objective   Exam:  BP 138/86 (BP Location: Left Arm, Patient Position: Sitting, Cuff Size: Normal)   Pulse 74   Ht 5' 2 (1.575 m) Comment: Height measured without shoes  Wt 147 lb 8 oz (66.9 kg)   LMP 03/24/2017 Comment: not sexually active  BMI 26.98 kg/m  Wt Readings from Last 3 Encounters:  04/03/24 147 lb 8 oz (66.9 kg)  02/26/24 147 lb (66.7 kg)  02/19/24 146 lb (66.2 kg)  Spanish interpreter present for entire visit Female MA present for entire exam  General: Well-appearing, normal muscle mass Eyes: sclera anicteric, no redness ENT: oral mucosa moist without lesions, no cervical or supraclavicular lymphadenopathy CV: Regular without appreciable murmur, no JVD, no peripheral edema Resp: clear to auscultation bilaterally, normal RR and effort noted GI: soft, no tenderness, with active bowel sounds. No guarding or palpable organomegaly noted.  No distention or bruit Skin; warm and dry, no rash or jaundice noted Neuro: awake, alert and oriented x 3. Normal gross motor function and fluent speech DRE normal with no tenderness, fissure or palpable internal lesion Anoscopy shows very small RP hemorrhoidal column prominence Labs:     Latest Ref Rng & Units 01/23/2024    3:30 PM 05/25/2023    4:21 PM 03/09/2023    4:16 PM  CBC  WBC 4.0 - 10.5 K/uL 8.1  9.5  11.3   Hemoglobin 12.0 - 15.0 g/dL 87.9  87.4  87.1   Hematocrit 36.0 - 46.0 % 35.2  37.0  39.1    Platelets 150 - 400 K/uL 351  329.0  295.0       Latest Ref Rng & Units 01/23/2024    3:30 PM 11/15/2023   11:36 AM 05/25/2023    4:21 PM  CMP  Glucose 70 - 99 mg/dL 898   894   BUN 6 - 20 mg/dL 9   11   Creatinine 9.55 - 1.00 mg/dL 9.29   9.16   Sodium 864 - 145 mmol/L 143   140   Potassium 3.5 - 5.1 mmol/L 2.8   3.5   Chloride 98 - 111 mmol/L 105   99   CO2 22 - 32 mmol/L 31   31   Calcium 8.9 - 10.3 mg/dL 9.0   9.1   Total Protein 6.5 - 8.1 g/dL 7.7  7.7  7.3   Total Bilirubin 0.0 - 1.2 mg/dL 0.2   0.2   Alkaline Phos 38 - 126 U/L 85   69   AST 15 - 41 U/L 28   26   ALT 0 - 44 U/L 27   22     Assessment: Encounter Diagnoses  Name Primary?   RLQ abdominal pain Yes   Altered bowel habits    Rectal bleeding     Her overall clinical picture has changed and evolved over the years that I have seen her.  Ultimately, I think she has irritable bowel syndrome that has been diarrhea predominant, though with reportedly less diarrhea since I  last saw her 2 many years ago. Benign hemorrhoidal bleeding on few occasions last few months.  The pain is puzzling and has become more prominent.  She is clear that it began with the surgery in 2022, suggesting perhaps there is an adhesive element to it.  She does not have clinical signs of obstruction.  Plan:  Given her current description of bowel habits, Viberzi  may have to greater than antimotility effect on her, and also probably would not do much for pain if this is all from IBS. I am going to try her on Levbid 0.125 mg 2-3 x/ day as needed when pain occurs and get a CT abdomen and pelvis to rule out any structural problems related to the surgery that could be causing this (internal hernia, partial obstruction)  Arrange follow-up clinic visit with APP  45 minutes were spent on this encounter, including in depth chart review, independent review of results as outlined above, communicating results with the patient directly, face-to-face time  with the patient, coordinating care, ordering studies and medications as appropriate, and documentation.    Victory LITTIE Brand III  CC: Referring provider noted above

## 2024-04-03 NOTE — Addendum Note (Signed)
 Addended by: Sesilia Poucher B on: 04/03/2024 04:44 PM   Modules accepted: Orders

## 2024-04-07 ENCOUNTER — Other Ambulatory Visit: Payer: Self-pay | Admitting: Emergency Medicine

## 2024-04-07 DIAGNOSIS — J329 Chronic sinusitis, unspecified: Secondary | ICD-10-CM

## 2024-04-10 ENCOUNTER — Ambulatory Visit (HOSPITAL_COMMUNITY)
Admission: RE | Admit: 2024-04-10 | Discharge: 2024-04-10 | Disposition: A | Discharge status: Home / Self Care | Payer: PRIVATE HEALTH INSURANCE | Source: Ambulatory Visit | Attending: Gastroenterology | Admitting: Gastroenterology

## 2024-04-10 DIAGNOSIS — R1031 Right lower quadrant pain: Secondary | ICD-10-CM | POA: Diagnosis present

## 2024-04-10 MED ORDER — IOHEXOL 300 MG/ML  SOLN
100.0000 mL | Freq: Once | INTRAMUSCULAR | Status: AC | PRN
  Administered 2024-04-10: 100 mL via INTRAVENOUS

## 2024-04-10 MED ORDER — IOHEXOL 9 MG/ML PO SOLN
1000.0000 mL | ORAL | Status: AC
  Administered 2024-04-10: 1000 mL via ORAL

## 2024-04-15 ENCOUNTER — Ambulatory Visit: Payer: Self-pay | Admitting: Gastroenterology

## 2024-04-16 ENCOUNTER — Other Ambulatory Visit: Payer: Self-pay | Admitting: Emergency Medicine

## 2024-04-16 DIAGNOSIS — I10 Essential (primary) hypertension: Secondary | ICD-10-CM

## 2024-04-16 NOTE — Telephone Encounter (Signed)
 I sent mychart message to pt/ daughter to call back for sleep study results.   LMVM for pt/ by spanish interpreter , [place], [place] [phone] to call back for sleep study results.    Sent letter for pt / family with results so pt can  call back to discuss.

## 2024-04-17 NOTE — Telephone Encounter (Unsigned)
 Copied from CRM #[id number]. Topic: Clinical - Medication Question >> Apr 17, 2024  8:43 AM Berneda FALCON wrote: Reason for CRM: Daughter Mercer is calling to check on the status of the [place] med refill. Patient is completely out of the medication. It looks like it was flagged. Are we able to get this called in for her?  [id number] [address], [place] - [address] AT [address], [place] [address] Phone: [phone]  Fax: [phone]  Vivan's callback is [phone]

## 2024-04-24 NOTE — Telephone Encounter (Signed)
 Pt DaughterETTER Raguel Hind dpr ) called to follow up about letter that was sent to home for PT   Daughter requested MD call  [phone]

## 2024-04-30 NOTE — Telephone Encounter (Signed)
 I called and LMVM for Raguel (daughter of pt) about sleep study results on pt.

## 2024-04-30 NOTE — Telephone Encounter (Signed)
-----   Message from Delon LITTIE Roys, [place] sent at 04/24/2024  3:55 PM EDT -----   ----- Message ----- From: Mitchell Banco Sent: 04/24/2024   3:45 PM EDT To: Gna-[facility] 1 Calls  ----- Message from Banco Mitchell sent at 04/24/2024  3:45 PM EDT -----

## 2024-04-30 NOTE — Telephone Encounter (Signed)
 Received call back from Vivian, other daughter, (she is not on DPR)  Katelin is on [place], as [place] is not available now.  I did give her results and process of getting started on autopap.  Pt is to use autopap min. 4 hrs every night or all night when she sleeps.  Needs appt 2-3 months after gets machine.  She was not sure about cost.  DME [employer] will call them with authorization from insurance, then will make decision.  Katelin to be contact due to [facility].  She verbalized understanding.

## 2024-05-06 NOTE — Telephone Encounter (Signed)
 RE: new autopap user Received: 5 days ago New, Adine Neysa Nena GORMAN, RN; Joylene Carlean Sheree Leveda Jackson Avelina; Tucker, Dolanda; 1 other Received, thank you!     Previous Messages    ----- Message ----- From: Neysa Nena GORMAN, RN Sent: 04/30/2024   5:00 PM EDT To: Adine Joylene; Avelina Jackson; Ephraim Dollar* Subject: new autopap user                              New order in EPIC for pt.  New autopap user   Julia Green Female, 53 y.o., 06-23-1971 MRN: [id number] Phone: [phone] Needs Interpreter: Spanish  Speak to Katelin daughter.  Thanks,  [place]

## 2024-05-29 ENCOUNTER — Ambulatory Visit: Payer: PRIVATE HEALTH INSURANCE | Admitting: Obstetrics and Gynecology

## 2024-05-29 ENCOUNTER — Encounter: Payer: Self-pay | Admitting: Obstetrics and Gynecology

## 2024-05-29 VITALS — BP 156/107 | HR 72 | Ht 61.5 in | Wt 146.0 lb

## 2024-05-29 DIAGNOSIS — K5904 Chronic idiopathic constipation: Secondary | ICD-10-CM

## 2024-05-29 DIAGNOSIS — N993 Prolapse of vaginal vault after hysterectomy: Secondary | ICD-10-CM | POA: Diagnosis not present

## 2024-05-29 DIAGNOSIS — R350 Frequency of micturition: Secondary | ICD-10-CM

## 2024-05-29 DIAGNOSIS — R310 Gross hematuria: Secondary | ICD-10-CM

## 2024-05-29 DIAGNOSIS — N3281 Overactive bladder: Secondary | ICD-10-CM

## 2024-05-29 DIAGNOSIS — N393 Stress incontinence (female) (male): Secondary | ICD-10-CM

## 2024-05-29 LAB — POCT URINALYSIS DIP (CLINITEK)
Bilirubin, UA: NEGATIVE
Blood, UA: NEGATIVE
Glucose, UA: NEGATIVE mg/dL
Ketones, POC UA: NEGATIVE mg/dL
Leukocytes, UA: NEGATIVE
Nitrite, UA: NEGATIVE
POC PROTEIN,UA: NEGATIVE
Spec Grav, UA: 1.02 (ref 1.010–1.025)
Urobilinogen, UA: 0.2 U/dL
pH, UA: 7 (ref 5.0–8.0)

## 2024-05-29 MED ORDER — MIRABEGRON ER 25 MG PO TB24: 25.0000 mg | ORAL_TABLET | Freq: Every day | ORAL | 5 refills | Status: DC

## 2024-05-29 MED ORDER — TROSPIUM CHLORIDE ER 60 MG PO CP24: 1.0000 | ORAL_CAPSULE | Freq: Every day | ORAL | 5 refills | Status: AC

## 2024-05-29 NOTE — Assessment & Plan Note (Signed)
-   Advised to start fiber supplement daily to help with constipation and loose stools.  - She follows with GI for IBS

## 2024-05-29 NOTE — Assessment & Plan Note (Signed)
-   Stage II anterior, Stage II posterior, Stage I apical prolapse - For treatment of pelvic organ prolapse, we discussed options for management including expectant management, conservative management, and surgical management, such as Kegels, a pessary, pelvic floor physical therapy, and specific surgical procedures. - She is not interested in treatment right now, but may be in the future

## 2024-05-29 NOTE — Progress Notes (Signed)
 New Patient Evaluation and Consultation  Referring Provider: Pachao, Dolorosa, FNP PCP: Purcell Emil Schanz, MD Date of Service: 05/29/2024  SUBJECTIVE Chief Complaint: New Patient (Initial Visit) (Julia Green is a 53 y.o. female is here for gross hematuria.)  History of Present Illness: Julia Green is a 53 y.o. hispanic female seen in consultation at the request of NP Dolorosa Pachao for evaluation of hematuria.     Urinary Symptoms: Leaks urine with cough/ sneeze, exercise, and with urgency Does not leak all the time, several times week with urgency Pad use: none Patient is bothered by UI symptoms. Has not had prior teatment.   Day time voids- sometimes every hour.  Nocturia: 4+ times per night to void. Voiding dysfunction:  empties bladder well.  Patient does not use a catheter to empty bladder.  When urinating, patient feels a weak stream and the need to urinate multiple times in a row Drinks: 1 cup coffee in AM, water, cammomile tea in evenings  UTIs: 0 UTI's in the last year.   Reports history of blood in urine- noticed large amount of blood on June 3. She denies any pain at that time. Blood lasted for a few days but then stopped and has not had bleeding since.    Pelvic Organ Prolapse Symptoms:                  Patient Admits to a feeling of a bulge the vaginal area. Feels pressure Patient Denies seeing a bulge.  This bulge is bothersome.  Bowel Symptom: Bowel movements: 1 time(s) per day Stool consistency: solid or loose Straining: yes.  Splinting: no.  Incomplete evacuation: yes, sometimes Patient Denies accidental bowel leakage / fecal incontinence Bowel regimen: metamucil occasionally  HM Colonoscopy          Upcoming     Colonoscopy (Every 10 Years) Next due on 03/12/2031    03/11/2021  COLONOSCOPY   Only the first 1 history entries have been loaded, but more history exists.                Sexual Function Sexually  active: no.    Pelvic Pain Denies pelvic pain  Past Medical History:  Past Medical History:  Diagnosis Date   Arthritis    Asthma    Depression    Fatty liver    Gallstones    GERD (gastroesophageal reflux disease)    Headache    History of low potassium    Hypertension    Hypothyroidism    Right arm numbness    Right leg numbness    Sciatic nerve pain    Thyroid disease    Urticaria      Past Surgical History:   Past Surgical History:  Procedure Laterality Date   APPENDECTOMY  2020   CARPAL TUNNEL RELEASE Right 08/07/2019   Procedure: RIGHT CARPAL TUNNEL RELEASE;  Surgeon: Jerri Kay HERO, MD;  Location: [facility];  Service: Orthopedics;  Laterality: Right;   COLONOSCOPY     LAPAROSCOPIC RIGHT HEMI COLECTOMY N/A 11/26/2020   Procedure: LAPAROSCOPIC RIGHT HEMI COLECTOMY;  Surgeon: Teresa Lonni HERO, MD;  Location: [facility];  Service: General;  Laterality: N/A;   LAPAROSCOPIC VAGINAL HYSTERECTOMY WITH SALPINGECTOMY Bilateral 06/26/2017   Procedure: LAPAROSCOPIC ASSISTED VAGINAL HYSTERECTOMY WITH SALPINGECTOMY;  Surgeon: Lavoie, Marie-Lyne, MD;  Location: [facility];  Service: Gynecology;  Laterality: Bilateral;  request 7:30am OR time  requests 2 hours Rex the lighted retractor rep will  be here    UPPER GI ENDOSCOPY       Past OB/GYN History: OB History  Gravida Para Term Preterm AB Living  5 4 4  1 4   SAB IAB Ectopic Multiple Live Births   1   4    # Outcome Date GA Lbr Len/2nd Weight Sex Type Anes PTL Lv  5 IAB           4 Term      Vag-Spont   LIV  3 Term      Vag-Spont   LIV  2 Term      Vag-Spont   LIV  1 Term      Vag-Spont   LIV   S/p hysterectomy     Component Value Date/Time   DIAGPAP  11/19/2021 1426    - Negative for intraepithelial lesion or malignancy (NILM)   HPVHIGH Negative 11/19/2021 1426   ADEQPAP Satisfactory for evaluation. 11/19/2021 1426    Medications: Patient has a current medication list which includes the following  prescription(s): albuterol , albuterol , aspirin ec, budesonide , calcium carbonate, cyanocobalamin, estradiol , fluticasone , levothyroxine , losartan -hydrochlorothiazide , montelukast , omega-3 fatty acids, omeprazole , trospium chloride, and ascorbic acid.   Allergies: Patient has no known allergies.   Social History:  Social History   Tobacco Use   Smoking status: Never    Passive exposure: Never   Smokeless tobacco: Never  Vaping Use   Vaping status: Never Used  Substance Use Topics   Alcohol use: No   Drug use: No    Relationship status: divorced Patient lives with her daughter.   Patient is employed. Regular exercise: No History of abuse: No  Family History:   Family History  Problem Relation Age of Onset   Asthma Mother    Hypertension Mother    Uterine cancer Mother    Kidney failure Father    Lung disease Father    High blood pressure Father    Cancer Sister        unknown   Kidney failure Sister    Anemia Daughter    Stomach cancer Maternal Aunt    Eczema Niece    Colon cancer Neg Hx    Esophageal cancer Neg Hx    Rectal cancer Neg Hx      Review of Systems: Review of Systems  Constitutional:  Positive for malaise/fatigue. Negative for fever and weight loss.  Respiratory:  Negative for cough, shortness of breath and wheezing.   Cardiovascular:  Negative for chest pain, palpitations and leg swelling.  Gastrointestinal:  Positive for blood in stool. Negative for abdominal pain.  Genitourinary:  Negative for dysuria.  Musculoskeletal:  Positive for myalgias.  Skin:  Negative for rash.  Neurological:  Positive for dizziness and headaches.  Endo/Heme/Allergies:  Does not bruise/bleed easily.  Psychiatric/Behavioral:  Negative for depression. The patient is not nervous/anxious.      OBJECTIVE Physical Exam: Vitals:   05/29/24 1506  BP: (!) 156/107  Pulse: 72  Weight: 146 lb (66.2 kg)  Height: 5' 1.5 (1.562 m)    Physical Exam Vitals reviewed. Exam  conducted with a chaperone present.  Constitutional:      General: She is not in acute distress. Pulmonary:     Effort: Pulmonary effort is normal.  Abdominal:     General: There is no distension.     Palpations: Abdomen is soft.     Tenderness: There is no abdominal tenderness. There is no rebound.  Musculoskeletal:        General:  No swelling. Normal range of motion.  Skin:    General: Skin is warm and dry.     Findings: No rash.  Neurological:     Mental Status: She is alert and oriented to person, place, and time.  Psychiatric:        Mood and Affect: Mood normal.        Behavior: Behavior normal.      GU / Detailed Urogynecologic Evaluation:  Pelvic Exam: Normal external female genitalia; Bartholin's and Skene's glands normal in appearance; urethral meatus normal in appearance, no urethral masses or discharge.   CST: negative  s/p hysterectomy: Speculum exam reveals normal vaginal mucosa with  atrophy and normal vaginal cuff.  Adnexa no mass, fullness, tenderness.     Pelvic floor strength I/V, puborectalis III/V external anal sphincter III/V  Pelvic floor musculature: Right levator non-tender, Right obturator non-tender, Left levator non-tender, Left obturator non-tender  POP-Q:   POP-Q  0                                            Aa   0                                           Ba  -6                                              C   4                                            Gh  3                                            Pb  8                                            tvl   -1                                            Ap  -1                                            Bp                                                 D      Rectal Exam:  Normal sphincter tone, small distal rectocele, enterocoele not present, no  rectal masses, no sign of dyssynergia when asking the patient to bear down.  Post-Void Residual (PVR) by Bladder Scan: In order  to evaluate bladder emptying, we discussed obtaining a postvoid residual and patient agreed to this procedure.  Procedure: The ultrasound unit was placed on the patient's abdomen in the suprapubic region after the patient had voided.      Laboratory Results: Lab Results  Component Value Date   COLORU yellow 05/29/2024   CLARITYU clear 05/29/2024   GLUCOSEUR negative 05/29/2024   BILIRUBINUR negative 05/29/2024   SPECGRAV 1.020 05/29/2024   RBCUR negative 05/29/2024   PHUR 7.0 05/29/2024   PROTEINUR NEGATIVE 11/19/2021   UROBILINOGEN 0.2 05/29/2024   LEUKOCYTESUR Negative 05/29/2024    Lab Results  Component Value Date   CREATININE 0.70 01/23/2024   CREATININE 0.83 05/25/2023   CREATININE 0.76 03/09/2023    Lab Results  Component Value Date   HGBA1C 5.7 03/09/2023    Lab Results  Component Value Date   HGB 12.0 01/23/2024     ASSESSMENT AND PLAN Ms. lavon horn is a 53 y.o. with:  1. Gross hematuria   2. Overactive bladder   3. Urinary frequency   4. 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 Chronic idiopathic constipation     Gross hematuria Assessment & Plan: - For management of gross hematuria, we discussed the importance of work-up including assessing the upper and lower GU tract with CT urogram and cystoscopy.  - She recently had a CT A&P w/ contrast in Aug 2025 that showed a normal urinary tract, so will not need to repeat. Will plan to schedule her for office cysto.    Overactive bladder Assessment & Plan: - We discussed the symptoms of overactive bladder (OAB), which include urinary urgency, urinary frequency, nocturia, with or without urge incontinence.  While we do not know the exact etiology of OAB, several treatment options exist. We discussed management including behavioral therapy (decreasing bladder irritants, urge suppression strategies, timed voids, bladder retraining), physical therapy,  medication; for refractory cases posterior tibial nerve stimulation, sacral neuromodulation, and intravesical botulinum toxin injection.  - prescribed trospium 60mg  ER daily. For anticholinergic medications, we discussed the potential side effects of anticholinergics including dry eyes, dry mouth, constipation, cognitive impairment and urinary retention. - Not a candidate for myrbetriq due to elevated BP today  Orders: -     Trospium Chloride ER; Take 1 capsule (60 mg total) by mouth daily.  Dispense: 30 capsule; Refill: 5  Urinary frequency -     POCT URINALYSIS DIP (CLINITEK)  SUI (stress urinary incontinence, female) Assessment & Plan: - Not as bothersome to her. Briefly outline treatment options of pelvic PT, pessary or surgery.    Vaginal vault prolapse after hysterectomy Assessment & Plan: - Stage II anterior, Stage II posterior, Stage I apical prolapse - For treatment of pelvic organ prolapse, we discussed options for management including expectant management, conservative management, and surgical management, such as Kegels, a pessary, pelvic floor physical therapy, and specific surgical procedures. - She is not interested in treatment right now, but may be in the future   Chronic idiopathic constipation Assessment & Plan: - Advised to start fiber supplement daily to help with constipation and loose stools.  - She follows with GI for IBS    Return for cystoscopy   Rosaline LOISE Caper, MD

## 2024-05-29 NOTE — Patient Instructions (Addendum)
 Constipation: Our goal is to achieve formed bowel movements daily or every-other-day.  You may need to try different combinations of the following options to find what works best for you - everybody's body works differently so feel free to adjust the dosages as needed.  Some options to help maintain bowel health include:  Dietary changes (more leafy greens, vegetables and fruits; less processed foods) Fiber supplementation (Benefiber, FiberCon, Metamucil or Psyllium). Start slow and increase gradually to full dose. Over-the-counter agents such as: stool softeners (Docusate or Colace) and/or laxatives (Miralax, milk of magnesia)  Power Pudding is a natural mixture that may help your constipation.  To make blend 1 cup applesauce, 1 cup wheat bran, and 3/4 cup prune juice, refrigerate and then take 1 tablespoon daily with a large glass of water as needed.  You have a stage 2 (out of 4) prolapse.  We discussed the fact that it is not life threatening but there are several treatment options. For treatment of pelvic organ prolapse, we discussed options for management including expectant management, conservative management, and surgical management, such as Kegels, a pessary, pelvic floor physical therapy, and specific surgical procedures.     We discussed the symptoms of overactive bladder (OAB), which include urinary urgency, urinary frequency, night-time urination, with or without urge incontinence.  We discussed management including behavioral therapy (decreasing bladder irritants by following a bladder diet, urge suppression strategies, timed voids, bladder retraining), physical therapy, medication. Prescribed Trospium 60mg  daily.

## 2024-05-29 NOTE — Assessment & Plan Note (Signed)
-   Not as bothersome to her. Briefly outline treatment options of pelvic PT, pessary or surgery.

## 2024-05-29 NOTE — Assessment & Plan Note (Signed)
-   We discussed the symptoms of overactive bladder (OAB), which include urinary urgency, urinary frequency, nocturia, with or without urge incontinence.  While we do not know the exact etiology of OAB, several treatment options exist. We discussed management including behavioral therapy (decreasing bladder irritants, urge suppression strategies, timed voids, bladder retraining), physical therapy, medication; for refractory cases posterior tibial nerve stimulation, sacral neuromodulation, and intravesical botulinum toxin injection.  - prescribed trospium 60mg  ER daily. For anticholinergic medications, we discussed the potential side effects of anticholinergics including dry eyes, dry mouth, constipation, cognitive impairment and urinary retention. - Not a candidate for myrbetriq due to elevated BP today

## 2024-05-29 NOTE — Assessment & Plan Note (Signed)
-   For management of gross hematuria, we discussed the importance of work-up including assessing the upper and lower GU tract with CT urogram and cystoscopy.  - She recently had a CT A&P w/ contrast in Aug 2025 that showed a normal urinary tract, so will not need to repeat. Will plan to schedule her for office cysto.

## 2024-06-10 ENCOUNTER — Inpatient Hospital Stay: Payer: PRIVATE HEALTH INSURANCE | Attending: Hematology

## 2024-06-10 DIAGNOSIS — D472 Monoclonal gammopathy: Secondary | ICD-10-CM | POA: Insufficient documentation

## 2024-06-10 LAB — CBC WITH DIFFERENTIAL/PLATELET
Abs Immature Granulocytes: 0.03 K/uL (ref 0.00–0.07)
Basophils Absolute: 0.1 K/uL (ref 0.0–0.1)
Basophils Relative: 1 %
Eosinophils Absolute: 0.2 K/uL (ref 0.0–0.5)
Eosinophils Relative: 2 %
HCT: 37.8 % (ref 36.0–46.0)
Hemoglobin: 13.2 g/dL (ref 12.0–15.0)
Immature Granulocytes: 0 %
Lymphocytes Relative: 41 %
Lymphs Abs: 3.7 K/uL (ref 0.7–4.0)
MCH: 29.5 pg (ref 26.0–34.0)
MCHC: 34.9 g/dL (ref 30.0–36.0)
MCV: 84.6 fL (ref 80.0–100.0)
Monocytes Absolute: 1 K/uL (ref 0.1–1.0)
Monocytes Relative: 11 %
Neutro Abs: 4.1 K/uL (ref 1.7–7.7)
Neutrophils Relative %: 45 %
Platelets: 337 K/uL (ref 150–400)
RBC: 4.47 MIL/uL (ref 3.87–5.11)
RDW: 13.2 % (ref 11.5–15.5)
WBC: 9.1 K/uL (ref 4.0–10.5)
nRBC: 0 % (ref 0.0–0.2)

## 2024-06-10 LAB — COMPREHENSIVE METABOLIC PANEL WITH GFR
ALT: 26 U/L (ref 0–44)
AST: 26 U/L (ref 15–41)
Albumin: 4.3 g/dL (ref 3.5–5.0)
Alkaline Phosphatase: 108 U/L (ref 38–126)
Anion gap: 8 (ref 5–15)
BUN: 10 mg/dL (ref 6–20)
CO2: 30 mmol/L (ref 22–32)
Calcium: 9.6 mg/dL (ref 8.9–10.3)
Chloride: 100 mmol/L (ref 98–111)
Creatinine, Ser: 0.8 mg/dL (ref 0.44–1.00)
GFR, Estimated: >60 mL/min (ref >60)
Glucose, Bld: 85 mg/dL (ref 70–99)
Potassium: 3 mmol/L — ABNORMAL LOW (ref 3.5–5.1)
Sodium: 138 mmol/L (ref 135–145)
Total Bilirubin: 0.3 mg/dL (ref 0.0–1.2)
Total Protein: 7.8 g/dL (ref 6.5–8.1)

## 2024-06-11 LAB — KAPPA/LAMBDA LIGHT CHAINS
Kappa free light chain: 35.7 mg/L — ABNORMAL HIGH (ref 3.3–19.4)
Kappa, lambda light chain ratio: 1.97 — ABNORMAL HIGH (ref 0.26–1.65)
Lambda free light chains: 18.1 mg/L (ref 5.7–26.3)

## 2024-06-13 LAB — MULTIPLE MYELOMA PANEL, SERUM
Albumin SerPl Elph-Mcnc: 3.8 g/dL (ref 2.9–4.4)
Albumin/Glob SerPl: 1.1 (ref 0.7–1.7)
Alpha 1: 0.2 g/dL (ref 0.0–0.4)
Alpha2 Glob SerPl Elph-Mcnc: 0.7 g/dL (ref 0.4–1.0)
B-Globulin SerPl Elph-Mcnc: 1 g/dL (ref 0.7–1.3)
Gamma Glob SerPl Elph-Mcnc: 1.5 g/dL (ref 0.4–1.8)
Globulin, Total: 3.5 g/dL (ref 2.2–3.9)
IgA: 267 mg/dL (ref 87–352)
IgG (Immunoglobin G), Serum: 1684 mg/dL — ABNORMAL HIGH (ref 586–1602)
IgM (Immunoglobulin M), Srm: 47 mg/dL (ref 26–217)
M Protein SerPl Elph-Mcnc: 0.8 g/dL — ABNORMAL HIGH
Total Protein ELP: 7.3 g/dL (ref 6.0–8.5)

## 2024-06-24 ENCOUNTER — Other Ambulatory Visit: Payer: Self-pay | Admitting: Emergency Medicine

## 2024-07-01 ENCOUNTER — Encounter: Payer: Self-pay | Admitting: Hematology

## 2024-07-15 ENCOUNTER — Encounter: Payer: Self-pay | Admitting: Neurology

## 2024-07-24 ENCOUNTER — Ambulatory Visit: Payer: PRIVATE HEALTH INSURANCE | Admitting: Emergency Medicine

## 2024-07-24 ENCOUNTER — Encounter: Payer: Self-pay | Admitting: Emergency Medicine

## 2024-07-24 VITALS — BP 160/120 | HR 73 | Temp 98.3°F | Ht 61.5 in | Wt 150.0 lb

## 2024-07-24 DIAGNOSIS — J329 Chronic sinusitis, unspecified: Secondary | ICD-10-CM

## 2024-07-24 DIAGNOSIS — I10 Essential (primary) hypertension: Secondary | ICD-10-CM | POA: Diagnosis not present

## 2024-07-24 DIAGNOSIS — B9689 Other specified bacterial agents as the cause of diseases classified elsewhere: Secondary | ICD-10-CM | POA: Diagnosis not present

## 2024-07-24 MED ORDER — AMOXICILLIN-POT CLAVULANATE 875-125 MG PO TABS: 1.0000 | ORAL_TABLET | Freq: Two times a day (BID) | ORAL | 0 refills | Status: AC

## 2024-07-24 MED ORDER — AMLODIPINE BESYLATE 5 MG PO TABS: 5.0000 mg | ORAL_TABLET | Freq: Every day | ORAL | 3 refills | Status: AC

## 2024-07-24 NOTE — Assessment & Plan Note (Signed)
 BP Readings from Last 3 Encounters:  07/24/24 (!) 160/120  05/29/24 (!) 156/107  04/03/24 138/86  Elevated blood pressure readings in the office and at home Recommend to continue Hyzaar 100-12.5 mg and start amlodipine 5 mg daily Advised to monitor blood pressure readings at home daily for the next several weeks and contact the office if numbers persistently abnormal Cardiovascular risks associated with uncontrolled hypertension discussed Advised to avoid over-the-counter decongestants while treating sinus infection

## 2024-07-24 NOTE — Patient Instructions (Signed)
Hipertensin en los adultos Hypertension, Adult El trmino hipertensin es otra forma de denominar a la presin arterial elevada. La presin arterial elevada fuerza al corazn a trabajar ms para bombear la sangre. Esto puede causar problemas con el paso del tiempo. Una lectura de presin arterial est compuesta por 2 nmeros. Hay un nmero superior (sistlico) sobre un nmero inferior (diastlico). Lo ideal es tener la presin arterial por debajo de 120/80. Cules son las causas? Se desconoce la causa de esta afeccin. Algunas otras afecciones pueden provocar presin arterial elevada. Qu incrementa el riesgo? Algunos factores del estilo de vida pueden hacer que tenga ms probabilidades de desarrollar presin arterial elevada: Fumar. No hacer la cantidad suficiente de actividad fsica o ejercicio. Tener sobrepeso. Consumir mucha grasa, azcar, caloras o sal (sodio) en su dieta. Beber alcohol en exceso. Otros factores de riesgo son los siguientes: Tener alguna de estas afecciones: Enfermedad cardaca. Diabetes. Colesterol alto. Enfermedad renal. Apnea obstructiva del sueo. Tener antecedentes familiares de presin arterial elevada y colesterol elevado. Edad. El riesgo aumenta con la edad. Estrs. Cules son los signos o sntomas? Es posible que la presin arterial alta no cause sntomas. La presin arterial muy alta (crisis hipertensiva) puede provocar: Dolor de cabeza. Latidos cardacos acelerados o irregulares (palpitaciones). Falta de aire. Hemorragia nasal. Vomitar o sentir ganas de vomitar (nuseas). Cambios en la forma de ver. Dolor muy intenso en el pecho. Sensacin de mareo. Convulsiones. Cmo se trata? Esta afeccin se trata haciendo cambios saludables en el estilo de vida, por ejemplo: Consumir alimentos saludables. Hacer ms ejercicio. Beber menos alcohol. El mdico puede recetarle medicamentos si los cambios en el estilo de vida no son lo suficientemente  eficaces y si: El nmero de arriba est por encima de 130. El nmero de abajo est por encima de 80. Su presin arterial personal ideal puede variar. Siga estas indicaciones en su casa: Comida y bebida  Si se lo dicen, siga el plan de alimentacin de DASH (Dietary Approaches to Stop Hypertension, Maneras de alimentarse para detener la hipertensin). Para seguir este plan: Llene la mitad del plato de cada comida con frutas y verduras. Llene un cuarto del plato de cada comida con cereales integrales. Los cereales integrales incluyen pasta integral, arroz integral y pan integral. Coma y beba productos lcteos con bajo contenido de grasa, como leche descremada o yogur bajo en grasas. Llene un cuarto del plato de cada comida con protenas bajas en grasa (magras). Las protenas bajas en grasa incluyen pescado, pollo sin piel, huevos, frijoles y tofu. Evite consumir carne grasa, carne curada y procesada, o pollo con piel. Evite consumir alimentos prehechos o procesados. Limite la cantidad de sal en su dieta a menos de 1500 mg por da. No beba alcohol si: El mdico le indica que no lo haga. Est embarazada, puede estar embarazada o est tratando de quedar embarazada. Si bebe alcohol: Limite la cantidad que bebe a lo siguiente: De 0 a 1 medida por da para las mujeres. De 0 a 2 medidas por da para los hombres. Sepa cunta cantidad de alcohol hay en las bebidas que toma. En [place], una medida equivale a una botella de cerveza de 12 oz (355 ml), un vaso de vino de 5 oz (148 ml) o un vaso de una bebida alcohlica de alta graduacin de 1 oz (44 ml). Estilo de vida  Trabaje con su mdico para mantenerse en un peso saludable o para perder peso. Pregntele a su mdico cul es el peso recomendable para   usted. Realice al menos 30 minutos de ejercicio que haga que se acelere su corazn (ejercicio aerbico) la mayora de los das de la semana. Estos pueden incluir caminar, nadar o andar en  bicicleta. Realice al menos 30 minutos de ejercicio que fortalezca sus msculos (ejercicios de resistencia) al menos 3 das a la semana. Estos pueden incluir levantar pesas o hacer Pilates. No fume ni consuma ningn producto que contenga nicotina o tabaco. Si necesita ayuda para dejar de consumir estos productos, consulte al mdico. Controle su presin arterial en su casa tal como le indic el mdico. Concurra a todas las visitas de seguimiento. Medicamentos Use los medicamentos de venta libre y los recetados solamente como se lo haya indicado el mdico. Siga cuidadosamente las indicaciones. No omita las dosis de medicamentos para la presin arterial. Los medicamentos pierden eficacia si omite dosis. El hecho de omitir las dosis tambin aumenta el riesgo de otros problemas. Pregntele a su mdico a qu efectos secundarios o reacciones a los medicamentos debe prestar atencin. Comunquese con un mdico si: Piensa que tiene una reaccin a los medicamentos que est tomando. Tiene dolores de cabeza frecuentes. Siente mareos. Tiene hinchazn en los tobillos. Tiene problemas de visin. Solicite ayuda de inmediato si: Siente un dolor de cabeza muy intenso. Empieza a sentirse desorientado (confundido). Se siente dbil o adormecido. Siente que va a desmayarse. Tiene un dolor muy intenso en: Pecho. Vientre (abdomen). Vomita ms de una vez. Tiene dificultad para respirar. Estos sntomas pueden indicar una emergencia. Solicite ayuda de inmediato. Llame al 911. No espere a ver si los sntomas desaparecen. No conduzca por sus propios medios hasta el hospital. Resumen El trmino hipertensin es otra forma de denominar a la presin arterial elevada. La presin arterial elevada fuerza al corazn a trabajar ms para bombear la sangre. Para la mayora de las personas, una presin arterial normal es menor que 120/80. Las decisiones saludables pueden ayudarle a disminuir su presin arterial. Si no puede  bajar su presin arterial mediante decisiones saludables, es posible que deba tomar medicamentos. Esta informacin no tiene como fin reemplazar el consejo del mdico. Asegrese de hacerle al mdico cualquier pregunta que tenga. Document Revised: 06/17/2021 Document Reviewed: 06/17/2021 Elsevier Patient Education  2024 [employer]  

## 2024-07-24 NOTE — Progress Notes (Signed)
 Julia Green 53 y.o.   Chief Complaint  Patient presents with   Sinus Problem    sinus infection- headache, dizziness and sore throat Pt states that she is coughing up a lot of phlegm and she says that sometimes she has blood in her mouth and nose possibly due to dryness     HISTORY OF PRESENT ILLNESS: Acute problem visit today. This is a 53 y.o. female complaining of possible sinus infection. Symptoms started about a week ago with sore throat followed by headache and dizziness Also coughing.  Has seen blood from her nose and mouth Also history of hypertension with elevated numbers at home No other complaints or medical concerns today.  Sinus Problem Associated symptoms include congestion and headaches. Pertinent negatives include no chills, coughing or shortness of breath.     Prior to Admission medications   Medication Sig Start Date End Date Taking? Authorizing Provider  albuterol  (ACCUNEB ) 1.25 MG/3ML nebulizer solution Take by nebulization every 6 (six) hours as needed.    [provider]  albuterol  (VENTOLIN  HFA) 108 (90 Base) MCG/ACT inhaler Inhale 2 puffs into the lungs every 6 (six) hours as needed for wheezing or shortness of breath. 12/15/23   Norleen Lynwood ORN, MD  aspirin EC 81 MG tablet Take 81 mg by mouth daily.    [provider]  budesonide  (PULMICORT ) 0.25 MG/2ML nebulizer solution Take 2 mLs (0.25 mg total) by nebulization in the morning and at bedtime. 06/30/22   Tobie Arleta SQUIBB, MD  calcium carbonate (OSCAL) 1500 (600 Ca) MG TABS tablet Take 1,500 mg by mouth daily.    [provider]  Cyanocobalamin (VITAMIN B-12 PO) Take by mouth daily.    [provider]  estradiol  (ESTRACE ) 1 MG tablet Take 1 tablet (1 mg total) by mouth daily. 02/26/24   Glennon Almarie POUR, MD  fluticasone  (FLONASE ) 50 MCG/ACT nasal spray Place 2 sprays into both nostrils daily. 06/30/22   Tobie Arleta SQUIBB, MD  levothyroxine  (SYNTHROID ) 112 MCG tablet  TAKE 1 TABLET BY MOUTH EVERY MORNING*RETURN TO CLINIC IN 6 WEEKS FOR TSH RECHECK 06/25/24   Purcell Emil Schanz, MD  losartan -hydrochlorothiazide  [facility]) 100-12.5 MG tablet TAKE 1 TABLET BY MOUTH DAILY 04/16/24   Joshu Furukawa Jose, MD  montelukast  (SINGULAIR ) 10 MG tablet TAKE 1 TABLET(10 MG) BY MOUTH AT BEDTIME 04/07/24   Purcell Emil Schanz, MD  Omega-3 Fatty Acids (FISH OIL PO) Take by mouth.    [provider]  omeprazole  (PRILOSEC) 40 MG capsule TAKE 1 CAPSULE(40 MG) BY MOUTH DAILY 06/25/24   Purcell Emil Schanz, MD  Trospium  Chloride 60 MG CP24 Take 1 capsule (60 mg total) by mouth daily. 05/29/24   Marilynne Rosaline SAILOR, MD  vitamin C (ASCORBIC ACID) 500 MG tablet Take 500 mg by mouth daily.    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   Chronic idiopathic constipation 05/29/2024   Serum gammaglobulin increased 01/23/2024   MGUS (monoclonal gammopathy of unknown significance) 01/23/2024   Tiredness 01/08/2024   Daytime somnolence 01/08/2024   Non-restorative sleep 01/08/2024   Suspected sleep apnea 01/08/2024   Wheezing 12/15/2023   Rhinorrhea 12/05/2023   Palpitations 12/05/2023   Chronic sinusitis 06/15/2023   Arthralgia of multiple sites 05/25/2023   Hypokalemia 05/25/2023  Rheumatism 05/25/2023   History of asthma 03/09/2023   Current moderate episode of major depressive disorder without prior episode (HCC) 03/09/2023   Hypertension    Colon cancer (HCC) 11/26/2020   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   Hypothyroidism 11/16/2016    Past Medical History:  Diagnosis Date   Arthritis    Asthma    Depression    Fatty liver    Gallstones    GERD (gastroesophageal reflux disease)     Headache    History of low potassium    Hypertension    Hypothyroidism    Right arm numbness    Right leg numbness    Sciatic nerve pain    Thyroid disease    Urticaria     Past Surgical History:  Procedure Laterality Date   APPENDECTOMY  2020   CARPAL TUNNEL RELEASE Right 08/07/2019   Procedure: RIGHT CARPAL TUNNEL RELEASE;  Surgeon: Jerri Kay HERO, MD;  Location: [facility];  Service: Orthopedics;  Laterality: Right;   COLONOSCOPY     LAPAROSCOPIC RIGHT HEMI COLECTOMY N/A 11/26/2020   Procedure: LAPAROSCOPIC RIGHT HEMI COLECTOMY;  Surgeon: Teresa Lonni HERO, MD;  Location: [facility];  Service: General;  Laterality: N/A;   LAPAROSCOPIC VAGINAL HYSTERECTOMY WITH SALPINGECTOMY Bilateral 06/26/2017   Procedure: LAPAROSCOPIC ASSISTED VAGINAL HYSTERECTOMY WITH SALPINGECTOMY;  Surgeon: Lavoie, Marie-Lyne, MD;  Location: [facility];  Service: Gynecology;  Laterality: Bilateral;  request 7:30am OR time  requests 2 hours Rex the lighted retractor rep will be here    UPPER GI ENDOSCOPY      Social History   Socioeconomic History   Marital status: Divorced    Spouse name: Not on file   Number of children: 4   Years of education: Not on file   Highest education level: 12th grade  Occupational History   Not on file  Tobacco Use   Smoking status: Never    Passive exposure: Never   Smokeless tobacco: Never  Vaping Use   Vaping status: Never Used  Substance and Sexual Activity   Alcohol use: No   Drug use: No   Sexual activity: Yes    Partners: Male    Birth control/protection: Surgical    Comment: first time sexual intercourse 53 years old, less than 5   Other Topics Concern   Not on file  Social History Narrative   Lives with her daughter   Right handed   Caffeine: 1 cup in the morning   Social Drivers of Health   Financial Resource Strain: Low Risk  (07/23/2024)   Overall Financial Resource Strain (CARDIA)    Difficulty of Paying Living Expenses: Not very hard   Food Insecurity: Unknown (07/23/2024)   Hunger Vital Sign    Worried About Running Out of Food in the Last Year: Never true    Ran Out of Food in the Last Year: Patient declined  Transportation Needs: No Transportation Needs (07/23/2024)   PRAPARE - Administrator, Civil Service (Medical): No    Lack of Transportation (Non-Medical): No  Physical Activity: Sufficiently Active (07/23/2024)   Exercise Vital Sign    Days of Exercise per Week: 7 days    Minutes of Exercise per Session: 30 min  Stress: Patient Declined (07/23/2024)   [employer] of Occupational Health - Occupational Stress Questionnaire    Feeling of Stress: Patient declined  Social Connections: Moderately Isolated (07/23/2024)   Social Connection and Isolation Panel    Frequency  of Communication with Friends and Family: Three times a week    Frequency of Social Gatherings with Friends and Family: Twice a week    Attends Religious Services: More than 4 times per year    Active Member of [employer] or Organizations: No    Attends Engineer, Structural: Not on file    Marital Status: Divorced  Intimate Partner Violence: Not At Risk (01/23/2024)   Humiliation, Afraid, Rape, and Kick questionnaire    Fear of Current or Ex-Partner: No    Emotionally Abused: No    Physically Abused: No    Sexually Abused: No    Family History  Problem Relation Age of Onset   Asthma Mother    Hypertension Mother    Uterine cancer Mother    Kidney failure Father    Lung disease Father    High blood pressure Father    Cancer Sister        unknown   Kidney failure Sister    Anemia Daughter    Stomach cancer Maternal Aunt    Eczema Niece    Colon cancer Neg Hx    Esophageal cancer Neg Hx    Rectal cancer Neg Hx      Review of Systems  Constitutional: Negative.  Negative for chills and fever.  HENT:  Positive for congestion and sinus pain.   Respiratory: Negative.  Negative for cough and shortness of breath.    Cardiovascular: Negative.  Negative for chest pain and palpitations.  Gastrointestinal:  Negative for abdominal pain, diarrhea, nausea and vomiting.  Genitourinary: Negative.  Negative for dysuria and hematuria.  Skin: Negative.  Negative for rash.  Neurological:  Positive for dizziness and headaches.  All other systems reviewed and are negative.   Vitals:   07/24/24 1524 07/24/24 1530  BP: (!) 160/120 (!) 160/120  Pulse: 73   Temp: 98.3 F (36.8 C)   SpO2: 98%     Physical Exam Vitals reviewed.  Constitutional:      Appearance: Normal appearance.  HENT:     Head: Normocephalic.     Right Ear: Tympanic membrane, ear canal and external ear normal.     Left Ear: Tympanic membrane, ear canal and external ear normal.     Mouth/Throat:     Mouth: Mucous membranes are moist.     Pharynx: Oropharynx is clear.  Eyes:     Extraocular Movements: Extraocular movements intact.     Conjunctiva/sclera: Conjunctivae normal.     Pupils: Pupils are equal, round, and reactive to light.  Cardiovascular:     Rate and Rhythm: Normal rate and regular rhythm.     Pulses: Normal pulses.     Heart sounds: Normal heart sounds.  Pulmonary:     Effort: Pulmonary effort is normal.     Breath sounds: Normal breath sounds.  Musculoskeletal:     Cervical back: No tenderness.  Lymphadenopathy:     Cervical: No cervical adenopathy.  Skin:    General: Skin is warm and dry.  Neurological:     Mental Status: She is alert and oriented to person, place, and time.     Cranial Nerves: No cranial nerve deficit.     Sensory: No sensory deficit.     Motor: No weakness.     Coordination: Coordination normal.     Gait: Gait normal.  Psychiatric:        Mood and Affect: Mood normal.        Behavior: Behavior normal.  ASSESSMENT & PLAN: Problem List Items Addressed This Visit       Cardiovascular and Mediastinum   Hypertension   BP Readings from Last 3 Encounters:  07/24/24 (!) 160/120   05/29/24 (!) 156/107  04/03/24 138/86  Elevated blood pressure readings in the office and at home Recommend to continue Hyzaar 100-12.5 mg and start amlodipine 5 mg daily Advised to monitor blood pressure readings at home daily for the next several weeks and contact the office if numbers persistently abnormal Cardiovascular risks associated with uncontrolled hypertension discussed Advised to avoid over-the-counter decongestants while treating sinus infection       Relevant Medications   amLODipine (NORVASC) 5 MG tablet     Respiratory   Bacterial sinusitis - Primary   Acute on chronic disease Recommend to start Augmentin  875 mg twice a day for 10 days Symptom management discussed Advised to avoid over-the-counter decongestants Continue the use of Flonase  and saline nasal spray Recommend Tylenol  and or Advil  as needed for headaches      Relevant Medications   amoxicillin -clavulanate (AUGMENTIN ) 875-125 MG tablet   Patient Instructions  Hipertensin en los adultos Hypertension, Adult El trmino hipertensin es otra forma de denominar a la presin arterial elevada. La presin arterial elevada fuerza al corazn a trabajar ms para bombear la sangre. Esto puede causar problemas con el paso del [place]. Una lectura de presin arterial est compuesta por 2 nmeros. Hay un nmero superior (sistlico) sobre un nmero inferior (diastlico). Lo ideal es tener la presin arterial por debajo de 120/80. Cules son las causas? Se desconoce la causa de esta afeccin. Algunas otras afecciones pueden provocar presin arterial elevada. Qu incrementa el riesgo? Algunos factores del estilo de vida pueden hacer que tenga ms probabilidades de desarrollar presin arterial elevada: Fumar. No hacer la cantidad suficiente de actividad fsica o ejercicio. Tener sobrepeso. Consumir mucha grasa, azcar, caloras o sal (sodio) en su dieta. Beber alcohol en exceso. Otros factores de riesgo son los  siguientes: Tener alguna de estas afecciones: Enfermedad cardaca. Diabetes. Colesterol alto. Enfermedad renal. Apnea obstructiva del sueo. Tener antecedentes familiares de presin arterial elevada y colesterol elevado. Edad. El riesgo aumenta con la edad. Estrs. Cules son los signos o sntomas? Es posible que la presin arterial alta no cause sntomas. La presin arterial muy alta (crisis hipertensiva) puede provocar: Dolor de cabeza. Latidos cardacos acelerados o irregulares (palpitaciones). Falta de aire. Hemorragia nasal. Vomitar o sentir ganas de vomitar (nuseas). Cambios en la forma de ver. Dolor muy intenso en el pecho. Sensacin de mareo. Convulsiones. Cmo se trata? Esta afeccin se trata haciendo cambios saludables en el estilo de vida, por ejemplo: Consumir alimentos saludables. Hacer ms ejercicio. Beber menos alcohol. El mdico puede recetarle medicamentos si los cambios en el estilo de vida no son lo suficientemente eficaces y si: El nmero de arriba est por encima de 130. El nmero de abajo est por encima de 80. Su presin arterial personal ideal puede variar. Siga estas indicaciones en su casa: Comida y bebida  Si se lo dicen, siga el plan de alimentacin de DASH (Dietary Approaches to Stop Hypertension, Maneras de alimentarse para detener la hipertensin). Para seguir este plan: Llene la mitad del plato de cada comida con frutas y verduras. Llene un cuarto del plato de cada comida con cereales integrales. Los cereales integrales incluyen pasta integral, arroz integral y pan integral. Coma y beba productos lcteos con bajo contenido de grasa, como leche descremada o yogur bajo en grasas. Llene un cuarto del  plato de cada comida con protenas bajas en grasa (magras). Las protenas bajas en grasa incluyen pescado, pollo sin piel, huevos, frijoles y tofu. Evite consumir carne grasa, carne curada y procesada, o pollo con piel. Evite consumir alimentos  prehechos o procesados. Limite la cantidad de sal en su dieta a menos de 1500 mg por da. No beba alcohol si: El mdico le indica que no lo haga. Est embarazada, puede estar embarazada o est tratando de quedar embarazada. Si bebe alcohol: Limite la cantidad que bebe a lo siguiente: De 0 a 1 medida por da para las mujeres. De 0 a 2 medidas por da para los hombres. Sepa cunta cantidad de alcohol hay en las bebidas que toma. En los [address], una medida equivale a una botella de cerveza de 12 oz (355 ml), un vaso de vino de 5 oz (148 ml) o un vaso de una bebida alcohlica de alta graduacin de 1 oz (44 ml). Estilo de vida  Trabaje con su mdico para mantenerse en un peso saludable o para perder peso. Pregntele a su mdico cul es el peso recomendable para usted. Realice al menos 30 minutos de ejercicio que haga que se acelere su corazn (ejercicio magazine features editor) la directv de la [place]. Estos pueden incluir caminar, nadar o andar en bicicleta. Realice al menos 30 minutos de ejercicio que fortalezca sus msculos (ejercicios de resistencia) al menos 3 das a la [place]. Estos pueden incluir levantar pesas o hacer Pilates. No fume ni consuma ningn producto que contenga nicotina o tabaco. Si necesita ayuda para dejar de consumir estos productos, consulte al mdico. Controle su presin arterial en su casa tal como le indic el mdico. Concurra a todas las visitas de seguimiento. Medicamentos Use los medicamentos de venta libre y los recetados solamente como se lo haya indicado el mdico. Siga cuidadosamente las indicaciones. No omita las dosis de medicamentos para la presin arterial. Los medicamentos pierden eficacia si omite dosis. El hecho de omitir las dosis tambin aumenta el riesgo de otros problemas. Pregntele a su mdico a qu efectos secundarios o reacciones a los medicamentos debe prestar atencin. Comunquese con un mdico si: Piensa que tiene una reaccin a los  medicamentos que est tomando. Tiene dolores de cabeza frecuentes. Siente mareos. Tiene hinchazn en los tobillos. Tiene problemas de visin. Solicite ayuda de inmediato si: Siente un dolor de cabeza muy intenso. Empieza a sentirse desorientado (confundido). Se siente dbil o adormecido. Siente que va a desmayarse. Tiene un dolor muy intenso en: Pecho. Vientre (abdomen). Vomita ms de una vez. Tiene dificultad para respirar. Estos sntomas pueden customer service manager. Solicite ayuda de inmediato. Llame al 911. No espere a ver si los sntomas desaparecen. No conduzca por sus propios medios officemax incorporated. Resumen El trmino hipertensin es otra forma de denominar a la presin arterial elevada. La presin arterial elevada fuerza al corazn a trabajar ms para bombear la sangre. Para la [employer], una presin arterial normal es menor que 120/80. Las decisiones saludables pueden ayudarle a disminuir su presin arterial. Si no puede bajar su presin arterial mediante decisiones saludables, es posible que deba tomar medicamentos. Esta informacin no tiene theme park manager el consejo del mdico. Asegrese de hacerle al mdico cualquier pregunta que tenga. Document Revised: 06/17/2021 Document Reviewed: 06/17/2021 Elsevier Patient Education  2024 Elsevier Inc.    Emil Schaumann, MD [facility]

## 2024-07-24 NOTE — Assessment & Plan Note (Signed)
 Acute on chronic disease Recommend to start Augmentin  875 mg twice a day for 10 days Symptom management discussed Advised to avoid over-the-counter decongestants Continue the use of Flonase  and saline nasal spray Recommend Tylenol  and or Advil  as needed for headaches

## 2024-07-25 ENCOUNTER — Other Ambulatory Visit: Payer: Self-pay | Admitting: Emergency Medicine

## 2024-07-25 DIAGNOSIS — M2550 Pain in unspecified joint: Secondary | ICD-10-CM

## 2024-07-25 DIAGNOSIS — F321 Major depressive disorder, single episode, moderate: Secondary | ICD-10-CM

## 2024-07-25 DIAGNOSIS — M790 Rheumatism, unspecified: Secondary | ICD-10-CM

## 2024-08-19 ENCOUNTER — Ambulatory Visit: Payer: PRIVATE HEALTH INSURANCE | Admitting: Emergency Medicine

## 2024-09-19 ENCOUNTER — Ambulatory Visit: Payer: PRIVATE HEALTH INSURANCE | Admitting: Obstetrics and Gynecology

## 2024-09-19 VITALS — BP 154/88 | HR 75

## 2024-09-19 DIAGNOSIS — R350 Frequency of micturition: Secondary | ICD-10-CM

## 2024-09-19 DIAGNOSIS — N3281 Overactive bladder: Secondary | ICD-10-CM | POA: Diagnosis not present

## 2024-09-19 DIAGNOSIS — R310 Gross hematuria: Secondary | ICD-10-CM

## 2024-09-19 LAB — POCT URINALYSIS DIP (CLINITEK)
Bilirubin, UA: NEGATIVE
Glucose, UA: NEGATIVE mg/dL
Ketones, POC UA: NEGATIVE mg/dL
Leukocytes, UA: NEGATIVE
Nitrite, UA: NEGATIVE
POC PROTEIN,UA: NEGATIVE
Spec Grav, UA: 1.02
Urobilinogen, UA: 0.2 U/dL
pH, UA: 7

## 2024-09-19 MED ORDER — LIDOCAINE HCL URETHRAL/MUCOSAL 2 % EX GEL: 1.0000 | Freq: Once | CUTANEOUS | Status: AC

## 2024-09-19 NOTE — Patient Instructions (Addendum)
 Restart trospium  60mg  ER daily for overactive bladder symptoms.   Taking Care of Yourself after Cystoscopy  Drink plenty of water for a day or two following your procedure. Try to have about 8 ounces (one cup) at a time, and do this 6 times or more per day unless you have fluid restrictitons AVOID irritative beverages such as coffee, tea, soda, alcoholic or citrus drinks for a day or two, as this may cause burning with urination.  For the first 1-2 days after the procedure, your urine may be pink or red in color. You may have some blood in your urine as a normal side effect of the procedure. Large amounts of bleeding or difficulty urinating are NOT normal. Call the nurse line if this happens or go to the nearest Emergency Room if the bleeding is heavy or you cannot urinate at all and it is after hours.  You may experience some discomfort or a burning sensation with urination after having this procedure. You can use over the counter Azo or pyridium to help with burning and follow the instructions on the packaging. If it does not improve within 1-2 days, or other symptoms appear (fever, chills, or difficulty urinating) call the office to speak to a nurse.  You may return to normal daily activities such as work, school, driving, exercising and housework on the day of the procedure.   Reinicie el tratamiento con trospio 60 mg de liberacin prolongada al da para los sntomas de vejiga hiperactiva.  Cuidados despus de una cistoscopia   Beba abundante agua durante uno o [address] despus del procedimiento. Intente beber aproximadamente 240 ml (una taza) cada vez, y hgalo 6 veces o ms al da, a menos que tenga restricciones de lquidos.  EVITE las bebidas irritantes como el caf, el t, los refrescos, las bebidas alcohlicas o los ctricos durante uno o [place], ya que pueden causar ardor al geographical information systems officer.  Durante los primeros 1-2 das despus del procedimiento, la orina puede tener un color rosado o rojizo.  Es normal que haya algo de sangre en la orina como efecto secundario del procedimiento. Las hemorragias abundantes o la dificultad para orinar NO son normales. Llame a la lnea de enfermera si esto ocurre o acuda a la sala de emergencias ms cercana si el sangrado es abundante o no puede orinar en absoluto y es fuera del horario de visual merchandiser.  Puede experimentar cierta molestia o sensacin de ardor al orinar despus de este procedimiento. Puede usar Azo o piridio de venta libre para acupuncturist ardor y siga las instrucciones del envase. Si no mejora en 1-2 das, o si aparecen otros sntomas (fiebre, escalofros o dificultad para geographical information systems officer), llame a la clnica para hablar con una enfermera.  Puede retomar sus actividades diarias normales, como trabajar, ir a la escuela, science writer, radio producer ejercicio y las tareas del hogar, el mismo da del procedimiento.

## 2024-09-19 NOTE — Addendum Note (Signed)
 Addended by: MARILYNNE ROSALINE SAILOR on: 09/19/2024 11:31 AM   Modules accepted: Orders

## 2024-09-19 NOTE — Progress Notes (Signed)
 CYSTOSCOPY  CC:  This is a 54 y.o. with gross hematuria and OAB who presents today for cystoscopy.  Last visit, pt was prescribed trospium  60mg  ER for urge incontinence symptoms. She took it for about 2 weeks and around that time also had blood pressure elevations and needed changes in her BP meds. So she stopped the trospium  in case that was contributing. She is not sure if it was helping her bladder symptoms at that time.   Results for orders placed or performed in visit on 09/19/24  POCT URINALYSIS DIP (CLINITEK)   Collection Time: 09/19/24  9:20 AM  Result Value Ref Range   Color, UA yellow yellow   Clarity, UA clear clear   Glucose, UA negative negative mg/dL   Bilirubin, UA negative negative   Ketones, POC UA negative negative mg/dL   Spec Grav, UA 8.979 8.989 - 1.025   Blood, UA trace-intact (A) negative   pH, UA 7.0 5.0 - 8.0   POC PROTEIN,UA negative negative, trace   Urobilinogen, UA 0.2 0.2 or 1.0 E.U./dL   Nitrite, UA Negative Negative   Leukocytes, UA Negative Negative     BP (!) 154/88   Pulse 75   LMP 03/24/2017 Comment: not sexually active  CYSTOSCOPY: A time out was performed.  The periurethral area was prepped and draped in a sterile manner.  2% lidocaine  jetpack was inserted at the urethral meatus. The urethra and bladder were visualized with flexible cystoscope.  She had normal urethral coaptation and normal urethral mucosa.  She had normal bladder mucosa. She had bilateral clear efflux from both ureteral orifices.  She had no squamous metaplasia at the trigone, no trabeculations, cellules or diverticuli.     ASSESSMENT:  54 y.o. with gross hematuria and OAB. Cystoscopy today is normal.  PLAN:  - Restart trospium  60mg  ER, previously prescribed. We discussed this is unlikely to have contributed to increased blood pressures. Will f/u 6 weeks to review progress with medication.  - No recent episodes of hematuria- last was several months ago. Normal cysto today.  Prior CT negative for any GU abnormalities. We will expectantly manage  Rosaline LOISE Caper, MD

## 2024-09-30 ENCOUNTER — Inpatient Hospital Stay: Payer: PRIVATE HEALTH INSURANCE

## 2024-10-07 ENCOUNTER — Inpatient Hospital Stay: Payer: PRIVATE HEALTH INSURANCE | Admitting: Hematology

## 2024-11-01 ENCOUNTER — Ambulatory Visit: Payer: PRIVATE HEALTH INSURANCE | Admitting: Obstetrics and Gynecology

## 2025-01-23 ENCOUNTER — Encounter: Payer: PRIVATE HEALTH INSURANCE | Admitting: Emergency Medicine
